# Patient Record
Sex: Male | Born: 1955
Health system: Southern US, Community
[De-identification: ages and names within clinical notes are randomized; demographics above are authoritative.]

## PROBLEM LIST (undated history)

## (undated) DIAGNOSIS — I428 Other cardiomyopathies: Secondary | ICD-10-CM

## (undated) DIAGNOSIS — C679 Malignant neoplasm of bladder, unspecified: Secondary | ICD-10-CM

## (undated) DIAGNOSIS — I4510 Unspecified right bundle-branch block: Secondary | ICD-10-CM

## (undated) DIAGNOSIS — Z950 Presence of cardiac pacemaker: Secondary | ICD-10-CM

## (undated) DIAGNOSIS — Q2381 Bicuspid aortic valve: Secondary | ICD-10-CM

## (undated) DIAGNOSIS — Z87898 Personal history of other specified conditions: Secondary | ICD-10-CM

## (undated) DIAGNOSIS — M1990 Unspecified osteoarthritis, unspecified site: Secondary | ICD-10-CM

## (undated) DIAGNOSIS — I2510 Atherosclerotic heart disease of native coronary artery without angina pectoris: Secondary | ICD-10-CM

## (undated) DIAGNOSIS — I10 Essential (primary) hypertension: Secondary | ICD-10-CM

## (undated) DIAGNOSIS — I77810 Thoracic aortic ectasia: Secondary | ICD-10-CM

## (undated) DIAGNOSIS — K7689 Other specified diseases of liver: Secondary | ICD-10-CM

## (undated) DIAGNOSIS — R350 Frequency of micturition: Secondary | ICD-10-CM

## (undated) DIAGNOSIS — Z8679 Personal history of other diseases of the circulatory system: Secondary | ICD-10-CM

## (undated) DIAGNOSIS — R945 Abnormal results of liver function studies: Secondary | ICD-10-CM

## (undated) DIAGNOSIS — I351 Nonrheumatic aortic (valve) insufficiency: Secondary | ICD-10-CM

## (undated) DIAGNOSIS — R51 Headache: Secondary | ICD-10-CM

## (undated) DIAGNOSIS — Z953 Presence of xenogenic heart valve: Secondary | ICD-10-CM

## (undated) DIAGNOSIS — R351 Nocturia: Secondary | ICD-10-CM

## (undated) DIAGNOSIS — Q231 Congenital insufficiency of aortic valve: Secondary | ICD-10-CM

## (undated) DIAGNOSIS — R519 Headache, unspecified: Secondary | ICD-10-CM

## (undated) DIAGNOSIS — R011 Cardiac murmur, unspecified: Secondary | ICD-10-CM

## (undated) DIAGNOSIS — R7989 Other specified abnormal findings of blood chemistry: Secondary | ICD-10-CM

## (undated) DIAGNOSIS — K219 Gastro-esophageal reflux disease without esophagitis: Secondary | ICD-10-CM

## (undated) HISTORY — DX: Other specified abnormal findings of blood chemistry: R79.89

## (undated) HISTORY — DX: Thoracic aortic ectasia: I77.810

## (undated) HISTORY — DX: Abnormal results of liver function studies: R94.5

## (undated) HISTORY — DX: Other specified diseases of liver: K76.89

## (undated) HISTORY — DX: Nonrheumatic aortic (valve) insufficiency: I35.1

## (undated) HISTORY — DX: Bicuspid aortic valve: Q23.81

## (undated) HISTORY — DX: Essential (primary) hypertension: I10

## (undated) HISTORY — PX: APPENDECTOMY: SHX54

## (undated) HISTORY — PX: TRANSTHORACIC ECHOCARDIOGRAM: SHX275

## (undated) HISTORY — DX: Personal history of other specified conditions: Z87.898

## (undated) HISTORY — DX: Congenital insufficiency of aortic valve: Q23.1

## (undated) HISTORY — DX: Other cardiomyopathies: I42.8

## (undated) HISTORY — PX: CARDIAC CATHETERIZATION: SHX172

## (undated) HISTORY — PX: OTHER SURGICAL HISTORY: SHX169

---

## 1999-11-02 ENCOUNTER — Emergency Department (HOSPITAL_COMMUNITY): Admission: EM | Admit: 1999-11-02 | Discharge: 1999-11-02 | Payer: Self-pay

## 2007-08-06 HISTORY — PX: HYDROCELE EXCISION / REPAIR: SUR1145

## 2008-03-15 ENCOUNTER — Telehealth (INDEPENDENT_AMBULATORY_CARE_PROVIDER_SITE_OTHER): Payer: Self-pay | Admitting: *Deleted

## 2008-03-15 ENCOUNTER — Ambulatory Visit: Payer: Self-pay | Admitting: Gastroenterology

## 2008-03-30 ENCOUNTER — Ambulatory Visit: Payer: Self-pay | Admitting: Gastroenterology

## 2008-03-31 ENCOUNTER — Ambulatory Visit: Payer: Self-pay | Admitting: Cardiovascular Disease

## 2008-03-31 ENCOUNTER — Ambulatory Visit: Payer: Self-pay

## 2008-04-12 ENCOUNTER — Encounter: Payer: Self-pay | Admitting: Cardiovascular Disease

## 2008-04-12 ENCOUNTER — Ambulatory Visit: Payer: Self-pay

## 2008-04-19 ENCOUNTER — Ambulatory Visit: Payer: Self-pay | Admitting: Cardiovascular Disease

## 2008-07-11 ENCOUNTER — Encounter: Payer: Self-pay | Admitting: Cardiovascular Disease

## 2008-07-11 ENCOUNTER — Ambulatory Visit: Payer: Self-pay | Admitting: Cardiovascular Disease

## 2008-07-11 ENCOUNTER — Ambulatory Visit: Payer: Self-pay

## 2008-08-09 ENCOUNTER — Ambulatory Visit: Payer: Self-pay | Admitting: Cardiovascular Disease

## 2008-08-09 LAB — CONVERTED CEMR LAB
Glucose, Bld: 86 mg/dL (ref 70–99)
HCT: 41.6 % (ref 39.0–52.0)
Hemoglobin: 14.8 g/dL (ref 13.0–17.0)
INR: 1 (ref 0.8–1.0)
Lymphocytes Relative: 24.6 % (ref 12.0–46.0)
Monocytes Relative: 7.4 % (ref 3.0–12.0)
Prothrombin Time: 10.7 s — ABNORMAL LOW (ref 10.9–13.3)
RDW: 12.2 % (ref 11.5–14.6)
WBC: 12.1 10*3/uL — ABNORMAL HIGH (ref 4.5–10.5)

## 2008-08-10 ENCOUNTER — Inpatient Hospital Stay (HOSPITAL_BASED_OUTPATIENT_CLINIC_OR_DEPARTMENT_OTHER): Admission: RE | Admit: 2008-08-10 | Discharge: 2008-08-10 | Payer: Self-pay | Admitting: Cardiovascular Disease

## 2008-08-10 ENCOUNTER — Ambulatory Visit: Payer: Self-pay | Admitting: Cardiovascular Disease

## 2008-08-10 HISTORY — PX: CARDIAC CATHETERIZATION: SHX172

## 2008-08-30 ENCOUNTER — Ambulatory Visit: Payer: Self-pay | Admitting: Cardiovascular Disease

## 2008-08-30 ENCOUNTER — Ambulatory Visit (HOSPITAL_COMMUNITY): Admission: RE | Admit: 2008-08-30 | Discharge: 2008-08-30 | Payer: Self-pay | Admitting: Cardiovascular Disease

## 2008-11-09 ENCOUNTER — Emergency Department (HOSPITAL_COMMUNITY): Admission: EM | Admit: 2008-11-09 | Discharge: 2008-11-09 | Payer: Self-pay | Admitting: Emergency Medicine

## 2009-01-30 ENCOUNTER — Telehealth: Payer: Self-pay | Admitting: Cardiovascular Disease

## 2009-01-30 DIAGNOSIS — I359 Nonrheumatic aortic valve disorder, unspecified: Secondary | ICD-10-CM | POA: Insufficient documentation

## 2009-02-20 ENCOUNTER — Encounter: Payer: Self-pay | Admitting: Cardiovascular Disease

## 2009-02-20 ENCOUNTER — Ambulatory Visit: Payer: Self-pay | Admitting: Thoracic Surgery (Cardiothoracic Vascular Surgery)

## 2009-03-02 ENCOUNTER — Telehealth: Payer: Self-pay | Admitting: Cardiovascular Disease

## 2009-05-05 ENCOUNTER — Encounter: Payer: Self-pay | Admitting: Cardiovascular Disease

## 2009-05-08 ENCOUNTER — Encounter: Payer: Self-pay | Admitting: Thoracic Surgery (Cardiothoracic Vascular Surgery)

## 2009-05-08 ENCOUNTER — Ambulatory Visit: Payer: Self-pay | Admitting: Vascular Surgery

## 2009-05-08 ENCOUNTER — Ambulatory Visit: Payer: Self-pay | Admitting: Thoracic Surgery (Cardiothoracic Vascular Surgery)

## 2009-05-08 ENCOUNTER — Telehealth (INDEPENDENT_AMBULATORY_CARE_PROVIDER_SITE_OTHER): Payer: Self-pay | Admitting: *Deleted

## 2009-05-08 ENCOUNTER — Ambulatory Visit (HOSPITAL_COMMUNITY)
Admission: RE | Admit: 2009-05-08 | Discharge: 2009-05-08 | Payer: Self-pay | Admitting: Thoracic Surgery (Cardiothoracic Vascular Surgery)

## 2009-05-08 ENCOUNTER — Encounter: Payer: Self-pay | Admitting: Cardiovascular Disease

## 2009-05-10 ENCOUNTER — Encounter: Payer: Self-pay | Admitting: Thoracic Surgery (Cardiothoracic Vascular Surgery)

## 2009-05-10 ENCOUNTER — Ambulatory Visit: Payer: Self-pay | Admitting: Thoracic Surgery (Cardiothoracic Vascular Surgery)

## 2009-05-10 ENCOUNTER — Inpatient Hospital Stay (HOSPITAL_COMMUNITY)
Admission: RE | Admit: 2009-05-10 | Discharge: 2009-05-14 | Payer: Self-pay | Admitting: Thoracic Surgery (Cardiothoracic Vascular Surgery)

## 2009-05-10 ENCOUNTER — Ambulatory Visit: Payer: Self-pay | Admitting: Cardiovascular Disease

## 2009-05-10 DIAGNOSIS — Z953 Presence of xenogenic heart valve: Secondary | ICD-10-CM

## 2009-05-10 HISTORY — DX: Presence of xenogenic heart valve: Z95.3

## 2009-05-15 ENCOUNTER — Telehealth: Payer: Self-pay | Admitting: Cardiovascular Disease

## 2009-05-19 ENCOUNTER — Ambulatory Visit: Payer: Self-pay | Admitting: Thoracic Surgery (Cardiothoracic Vascular Surgery)

## 2009-05-22 ENCOUNTER — Encounter: Payer: Self-pay | Admitting: Cardiovascular Disease

## 2009-05-25 ENCOUNTER — Encounter (INDEPENDENT_AMBULATORY_CARE_PROVIDER_SITE_OTHER): Payer: Self-pay | Admitting: *Deleted

## 2009-05-30 ENCOUNTER — Ambulatory Visit: Payer: Self-pay | Admitting: Cardiovascular Disease

## 2009-06-05 ENCOUNTER — Encounter
Admission: RE | Admit: 2009-06-05 | Discharge: 2009-06-05 | Payer: Self-pay | Admitting: Thoracic Surgery (Cardiothoracic Vascular Surgery)

## 2009-06-05 ENCOUNTER — Ambulatory Visit: Payer: Self-pay | Admitting: Thoracic Surgery (Cardiothoracic Vascular Surgery)

## 2009-06-08 ENCOUNTER — Encounter: Payer: Self-pay | Admitting: Cardiovascular Disease

## 2009-07-10 ENCOUNTER — Telehealth: Payer: Self-pay | Admitting: Cardiovascular Disease

## 2009-10-02 ENCOUNTER — Ambulatory Visit: Payer: Self-pay | Admitting: Thoracic Surgery (Cardiothoracic Vascular Surgery)

## 2009-10-02 ENCOUNTER — Encounter: Payer: Self-pay | Admitting: Cardiovascular Disease

## 2010-06-20 ENCOUNTER — Ambulatory Visit: Payer: Self-pay | Admitting: Cardiovascular Disease

## 2010-07-03 ENCOUNTER — Encounter: Payer: Self-pay | Admitting: Cardiovascular Disease

## 2010-07-03 ENCOUNTER — Ambulatory Visit: Payer: Self-pay

## 2010-07-03 ENCOUNTER — Ambulatory Visit: Payer: Self-pay | Admitting: Cardiology

## 2010-07-03 ENCOUNTER — Ambulatory Visit (HOSPITAL_COMMUNITY): Admission: RE | Admit: 2010-07-03 | Discharge: 2010-07-03 | Payer: Self-pay | Admitting: Cardiovascular Disease

## 2010-09-04 NOTE — Assessment & Plan Note (Signed)
Summary: YEARLY/SL   Visit Type:  1 YR F/U Primary Provider:  Almedia Balls  CC:  calf pain at times when walking.Marland Kitchengets better w/rest....denies any other complai8nts today.  History of Present Illness: 55 yo WM with history of bicuspid aortic valve now s/p aortic valve repair with aortic root replacement by Dr. Cornelius Moras 05/10/09 here today for follow up. I last saw him October 2010.  He has been doing well. He has no shortness of breath. He has been feeling well. He has been riding his bike 40 miles per day at least 4 days per week.   Current Medications (verified): 1)  Aspirin 81 Mg Tbec (Aspirin) .... Take One Tablet By Mouth Daily 2)  Multivitamins   Tabs (Multiple Vitamin) .[name].. 1 Tab Once Daily 3)  Fish Oil 1000 Mg Caps (Omega-3 Fatty Acids) .[name].. 1 Cap Once Daily 4)  Vitamin D 1000 Unit Tabs (Cholecalciferol) .[name].. 1 Tab Once Daily  Allergies (verified): No Known Drug Allergies  Past History:  Past Medical History: Reviewed history from 05/30/2009 and no changes required. Bicuspid Aortic valve with moderate AI s/p AVR with bioprosthetic AV 10/10 Thoracic aortic aneurysm s/p aortic root repair 10/10 Tobacco Abuse-stopped 2010  Social History: Reviewed history from 05/30/2009 and no changes required. Tobacco abuse-1 1/2 ppd for 35 years-stopped 2010 No alcohol No illicit drugs Single 2 children  Review of Systems  The patient denies fatigue, malaise, fever, weight gain/loss, vision loss, decreased hearing, hoarseness, chest pain, palpitations, shortness of breath, prolonged cough, wheezing, sleep apnea, coughing up blood, abdominal pain, blood in stool, nausea, vomiting, diarrhea, heartburn, incontinence, blood in urine, muscle weakness, joint pain, leg swelling, rash, skin lesions, headache, fainting, dizziness, depression, anxiety, enlarged lymph nodes, easy bruising or bleeding, and environmental allergies.    Vital Signs:  Patient profile:   55 year old male Height:      72  inches Weight:      184.8 pounds BMI:     25.15 Pulse rate:   70 / minute Pulse rhythm:   irregular BP sitting:   128 / 88  (left arm) Cuff size:   large  Vitals Entered By: Danielle Rankin, CMA (June 20, 2010 9:17 AM)  Physical Exam  General:  General: Well developed, well nourished, NAD HEENT: OP clear, mucus membranes moist SKIN: warm, dry Neuro: No focal deficits Musculoskeletal: Muscle strength 5/5 all ext Psychiatric: Mood and affect normal Neck: No JVD, no carotid bruits, no thyromegaly, no lymphadenopathy. Lungs:Clear bilaterally, no wheezes, rhonci, crackles CV: RRR no murmurs, gallops rubs Abdomen: soft, NT, ND, BS present Extremities: No edema, pulses 2+.    EKG  Procedure date:  06/20/2010  Findings:      NSR, rate 70 bpm. RBBB  Impression & Recommendations:  Problem # 1:  AORTIC VALVE DISORDERS (ICD-424.1)  s/p AVR with bioprosthetic AV with aortic root replacement 2010. Doing well. Continue daily ASA and daily exercise. Will repeat echo. I will see him back in one year.   Orders: EKG w/ Interpretation (93000) Echocardiogram (Echo)  Patient Instructions: 1)  Your physician recommends that you schedule a follow-up appointment in: 1 year 2)  Your physician recommends that you continue on your current medications as directed. Please refer to the Current Medication list given to you today. 3)  Your physician has requested that you have an echocardiogram.  Echocardiography is a painless test that uses sound waves to create images of your heart. It provides your doctor with information about the size and shape  of your heart and how well your heart's chambers and valves are working.  This procedure takes approximately one hour. There are no restrictions for this procedure.

## 2010-09-04 NOTE — Letter (Signed)
Summary: [facility]   [facility]   Imported By: Roderic Ovens 10/12/2009 15:04:40  _____________________________________________________________________  External Attachment:    Type:   Image     Comment:   External Document

## 2010-11-08 LAB — POCT I-STAT 3, ART BLOOD GAS (G3+)
Acid-base deficit: 2 mmol/L (ref 0.0–2.0)
Bicarbonate: 23.8 mEq/L (ref 20.0–24.0)
O2 Saturation: 99 %
O2 Saturation: 99 %
pCO2 arterial: 41.4 mmHg (ref 35.0–45.0)
pCO2 arterial: 44.5 mmHg (ref 35.0–45.0)
pH, Arterial: 7.364 (ref 7.350–7.450)
pH, Arterial: 7.366 (ref 7.350–7.450)
pO2, Arterial: 127 mmHg — ABNORMAL HIGH (ref 80.0–100.0)
pO2, Arterial: 137 mmHg — ABNORMAL HIGH (ref 80.0–100.0)

## 2010-11-08 LAB — CBC
HCT: 42.4 % (ref 39.0–52.0)
Hemoglobin: 11.7 g/dL — ABNORMAL LOW (ref 13.0–17.0)
MCHC: 33.8 g/dL (ref 30.0–36.0)
MCHC: 34.3 g/dL (ref 30.0–36.0)
MCV: 97 fL (ref 78.0–100.0)
MCV: 97.3 fL (ref 78.0–100.0)
MCV: 97.5 fL (ref 78.0–100.0)
Platelets: 120 10*3/uL — ABNORMAL LOW (ref 150–400)
Platelets: 147 10*3/uL — ABNORMAL LOW (ref 150–400)
Platelets: 150 10*3/uL (ref 150–400)
Platelets: 153 10*3/uL (ref 150–400)
RBC: 3.34 MIL/uL — ABNORMAL LOW (ref 4.22–5.81)
RBC: 3.51 MIL/uL — ABNORMAL LOW (ref 4.22–5.81)
RBC: 3.6 MIL/uL — ABNORMAL LOW (ref 4.22–5.81)
RBC: 4.36 MIL/uL (ref 4.22–5.81)
RDW: 13.6 % (ref 11.5–15.5)
RDW: 13.7 % (ref 11.5–15.5)
RDW: 13.9 % (ref 11.5–15.5)
WBC: 11.5 10*3/uL — ABNORMAL HIGH (ref 4.0–10.5)
WBC: 16.1 10*3/uL — ABNORMAL HIGH (ref 4.0–10.5)
WBC: 17.8 10*3/uL — ABNORMAL HIGH (ref 4.0–10.5)
WBC: 23.7 10*3/uL — ABNORMAL HIGH (ref 4.0–10.5)

## 2010-11-08 LAB — BLOOD GAS, ARTERIAL
Acid-base deficit: 1 mmol/L (ref 0.0–2.0)
Bicarbonate: 22.7 mEq/L (ref 20.0–24.0)
Drawn by: 206361
FIO2: 0.21 %
TCO2: 23.8 mmol/L (ref 0–100)
pH, Arterial: 7.425 (ref 7.350–7.450)
pO2, Arterial: 88.2 mmHg (ref 80.0–100.0)

## 2010-11-08 LAB — POCT I-STAT 4, (NA,K, GLUC, HGB,HCT)
Glucose, Bld: 107 mg/dL — ABNORMAL HIGH (ref 70–99)
Glucose, Bld: 110 mg/dL — ABNORMAL HIGH (ref 70–99)
Glucose, Bld: 141 mg/dL — ABNORMAL HIGH (ref 70–99)
Glucose, Bld: 152 mg/dL — ABNORMAL HIGH (ref 70–99)
Glucose, Bld: 45 mg/dL — ABNORMAL LOW (ref 70–99)
HCT: 27 % — ABNORMAL LOW (ref 39.0–52.0)
HCT: 28 % — ABNORMAL LOW (ref 39.0–52.0)
HCT: 34 % — ABNORMAL LOW (ref 39.0–52.0)
HCT: 37 % — ABNORMAL LOW (ref 39.0–52.0)
Hemoglobin: 11.6 g/dL — ABNORMAL LOW (ref 13.0–17.0)
Hemoglobin: 12.6 g/dL — ABNORMAL LOW (ref 13.0–17.0)
Hemoglobin: 9.2 g/dL — ABNORMAL LOW (ref 13.0–17.0)
Hemoglobin: 9.5 g/dL — ABNORMAL LOW (ref 13.0–17.0)
Potassium: 3.8 mEq/L (ref 3.5–5.1)
Potassium: 4 mEq/L (ref 3.5–5.1)
Sodium: 141 mEq/L (ref 135–145)
Sodium: 143 mEq/L (ref 135–145)

## 2010-11-08 LAB — TYPE AND SCREEN: ABO/RH(D): A POS

## 2010-11-08 LAB — COMPREHENSIVE METABOLIC PANEL
AST: 34 U/L (ref 0–37)
Albumin: 4.2 g/dL (ref 3.5–5.2)
Alkaline Phosphatase: 48 U/L (ref 39–117)
CO2: 24 mEq/L (ref 19–32)
Calcium: 10.3 mg/dL (ref 8.4–10.5)
GFR calc Af Amer: >60 mL/min (ref >60)
Sodium: 140 mEq/L (ref 135–145)
Total Bilirubin: 0.7 mg/dL (ref 0.3–1.2)

## 2010-11-08 LAB — POCT I-STAT, CHEM 8
BUN: 10 mg/dL (ref 6–23)
Calcium, Ion: 1.16 mmol/L (ref 1.12–1.32)
HCT: 31 % — ABNORMAL LOW (ref 39.0–52.0)
Hemoglobin: 10.5 g/dL — ABNORMAL LOW (ref 13.0–17.0)
Sodium: 143 mEq/L (ref 135–145)
TCO2: 23 mmol/L (ref 0–100)

## 2010-11-08 LAB — BASIC METABOLIC PANEL
BUN: 14 mg/dL (ref 6–23)
CO2: 25 mEq/L (ref 19–32)
Calcium: 8.4 mg/dL (ref 8.4–10.5)
Calcium: 8.9 mg/dL (ref 8.4–10.5)
Chloride: 111 mEq/L (ref 96–112)
Creatinine, Ser: 0.76 mg/dL (ref 0.4–1.5)
GFR calc Af Amer: >60 mL/min (ref >60)
GFR calc non Af Amer: >60 mL/min (ref >60)
Glucose, Bld: 119 mg/dL — ABNORMAL HIGH (ref 70–99)
Glucose, Bld: 127 mg/dL — ABNORMAL HIGH (ref 70–99)

## 2010-11-08 LAB — GLUCOSE, CAPILLARY
Glucose-Capillary: 117 mg/dL — ABNORMAL HIGH (ref 70–99)
Glucose-Capillary: 124 mg/dL — ABNORMAL HIGH (ref 70–99)
Glucose-Capillary: 124 mg/dL — ABNORMAL HIGH (ref 70–99)
Glucose-Capillary: 128 mg/dL — ABNORMAL HIGH (ref 70–99)
Glucose-Capillary: 133 mg/dL — ABNORMAL HIGH (ref 70–99)
Glucose-Capillary: 175 mg/dL — ABNORMAL HIGH (ref 70–99)
Glucose-Capillary: 28 mg/dL — CL (ref 70–99)
Glucose-Capillary: 71 mg/dL (ref 70–99)
Glucose-Capillary: 96 mg/dL (ref 70–99)

## 2010-11-08 LAB — MAGNESIUM
Magnesium: 2.3 mg/dL (ref 1.5–2.5)
Magnesium: 2.6 mg/dL — ABNORMAL HIGH (ref 1.5–2.5)

## 2010-11-08 LAB — CREATININE, SERUM
Creatinine, Ser: 0.79 mg/dL (ref 0.4–1.5)
GFR calc Af Amer: >60 mL/min (ref >60)
GFR calc non Af Amer: >60 mL/min (ref >60)

## 2010-11-08 LAB — URINALYSIS, ROUTINE W REFLEX MICROSCOPIC
Bilirubin Urine: NEGATIVE
Hgb urine dipstick: NEGATIVE
Ketones, ur: NEGATIVE mg/dL
Specific Gravity, Urine: 1.011 (ref 1.005–1.030)
pH: 5.5 (ref 5.0–8.0)

## 2010-11-08 LAB — PROTIME-INR
INR: 1.51 — ABNORMAL HIGH (ref 0.00–1.49)
Prothrombin Time: 18.1 seconds — ABNORMAL HIGH (ref 11.6–15.2)

## 2010-11-08 LAB — POCT I-STAT GLUCOSE
Glucose, Bld: 126 mg/dL — ABNORMAL HIGH (ref 70–99)
Glucose, Bld: 148 mg/dL — ABNORMAL HIGH (ref 70–99)
Operator id: 3406
Operator id: 3406

## 2010-11-08 LAB — APTT
aPTT: 25 seconds (ref 24–37)
aPTT: 39 seconds — ABNORMAL HIGH (ref 24–37)

## 2010-11-08 LAB — ABO/RH: ABO/RH(D): A POS

## 2010-12-18 NOTE — H&P (Signed)
HISTORY AND PHYSICAL EXAMINATION   February 20, 2009   Re:  [name], [name]           DOB:  12-13-1955   REASON FOR CONSULTATION:  Severe aortic insufficiency.   HISTORY OF PRESENT ILLNESS:  The patient is a 55 year old gentleman with  no previous history of coronary artery disease, congestive heart  failure, or valvular heart disease, who had otherwise been physically  active and healthy most of his life.  Approximately 1 year ago, he was  noted to have a heart murmur on routine physical exam and wellness  check.  An echocardiogram was performed at that time demonstrating  aortic regurgitation.  Ultimately he was referred to Dr. Verne Carrow.  He underwent a 2-D echocardiogram in December 2009, what was  interpreted as moderate aortic regurgitation with bicuspid aortic valve.  There was dilatation of the left ventricular outflow tract and aortic  root, and there was mild-to-moderate left ventricular dysfunction with  ejection fraction estimated at 45%.  There was mild left ventricular  hypertrophy.  The patient subsequently underwent cardiac MRI and left  and right heart catheterization on August 10, 2008.  This confirmed the  presence of at least moderate-to-severe aortic regurgitation with  significant left ventricular dysfunction with ejection fraction  estimated at 45%.  The aortic root and ascending thoracic aorta was  dilated to 4.5 cm and the presence of bicuspid aortic valve was  confirmed.  There was normal coronary artery anatomy with no significant  coronary artery disease.  No other abnormalities were noted.  Dr.  Clifton James discussed the possibility of referral for elective surgical  intervention, and the patient elected to hold off until now for a  variety of reasons.  He now desires to consider elective surgical  intervention.   REVIEW OF SYSTEMS:  General:  The patient reports feeling well.  He has  normal appetite.  He has not been  gaining nor losing weight.  He is 6  feet tall and weighs approximately 190 pounds.  Cardiac:  The patient  denies any shortness of breath with activity or at rest.  He states that  he gets short of breath if he pushes himself physically, but this is  stable and has not changed or diminished over time.  He reports  occasional mild transient vague substernal chest discomfort that is  described as a dull ache, but this seems to come and go sporadically and  is not typically severe and is not associated with physical activity.  He denies any tachypalpitations or dizzy spells.  He denies any PND,  orthopnea, or lower extremity edema.  Respiratory:  Negative.  The  patient denies productive cough, hemoptysis, or wheezing.  Gastrointestinal:  Negative.  The patient has no difficulty swallowing.  He denies hematochezia, hematemesis, or melena.  Genitourinary:  Negative.  The patient denies urinary urgency or frequency.  Musculoskeletal:  Notable for chronic problems with mild lower back pain  and secondary sciatic pain in both lower extremities.  This is stable  and chronic.  Neurologic:  Negative.  The patient denies transient  monocular blindness or transient numbness or weakness involving either  upper and lower extremity.  Psychiatric:  Negative.  HEENT:  Negative.  The patient sees his dentist regularly and is routinely given oral  antibiotics for prophylaxis due to the presence of his underlying heart  murmur.   PAST MEDICAL HISTORY:  1. Bicuspid aortic valve with aortic regurgitation.  2. Dilated aortic root.  PAST SURGICAL HISTORY:  Hydrocele repair.   FAMILY HISTORY:  The patient's father had mitral valve repair for mitral  valve prolapse.   SOCIAL HISTORY:  The patient is single and lives with his significant  other locally here in [place].  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  He has a longstanding history of tobacco  abuse and currently  reports smoking one and a half pack of cigarettes  per day.  He denies alcohol consumption of significance.   CURRENT MEDICATIONS:  Ibuprofen as needed for pain.   DRUG ALLERGIES:  None known.   PHYSICAL EXAMINATION:  GENERAL:  The patient is a well-appearing male,  who appears his stated age in no acute distress.  VITAL SIGNS:  Blood pressure 150/92, pulse 90, and oxygen saturation 97%  on room air.  HEENT:  Grossly unrevealing.  NECK:  Supple.  There is no cervical or supraclavicular lymphadenopathy.  There is no jugular venous distention.  No carotid bruits are noted.  LUNGS:  Auscultation of the chest reveals clear breath sounds which are  symmetrical bilaterally.  No wheezes or rhonchi are noted.  CARDIOVASCULAR:  Regular rate and rhythm.  There is a grade 2/6 early  diastolic murmur heard best along the left lower sternal border.  No  systolic murmurs are appreciated.  ABDOMEN:  Soft, nondistended, and nontender.  Bowel sounds are present.  EXTREMITIES:  Warm and well perfused.  There is no lower extremity  edema.  Distal pulses are easily palpable of water hammer  characteristics.  RECTAL/GU:  Deferred.  NEUROLOGIC:  Grossly nonfocal.   DIAGNOSTIC TESTS:  A 2-D echocardiogram performed on July 11, 2008,  is reviewed.  This demonstrates what appears to be functionally bicuspid  aortic valve with at least moderate aortic regurgitation.  The aortic  root is dilated.  No other significant abnormalities are noted.  There  is mild left ventricular hypertrophy and there is mild left ventricular  chamber enlargement.   Cardiac MRI performed on August 10, 2008, is reviewed.  Findings are as  discussed previously and notable for the presence of aortic  regurgitation as well as dilated aortic root.  The maximum transverse  diameter of the proximal ascending aorta is 4.5 cm.  This tapers down to  a normal appearance at the transverse aortic arch and the descending  thoracic aorta  is normal.   Cardiac catheterization performed on August 10, 2008, is reviewed.  This  demonstrates normal coronary artery anatomy with no significant coronary  artery disease.  There is at least moderate aortic regurgitation on  aortic root injection.  There is some left ventricular chamber  enlargement.  There is moderate left ventricular dysfunction with  ejection fraction estimated at 40-45%.   IMPRESSION:  Aortic regurgitation with bicuspid aortic valve.  The  patient has mild-to-moderate left ventricular dysfunction with ejection  fraction estimated at 45%.  Alternatives at this time include continued  close observation with medical therapy versus proceeding with surgery.  The rationale for proceeding with surgery are discussed at length.  All  surgical alternatives have been reviewed in detail.   PLAN:  I have discussed matters at length with the patient and his close  friend here in the office today.  We discussed possible surgical  alternatives including replacement of his entire aortic root using a  valve conduit with mechanical prosthesis.  This would provide a durable  long-term result, but come with the need for  long-term anticoagulation  with Coumadin.  We discussed a variety of alternatives including  replacement of his aortic root with a synthetic conduit and use of a  bioprosthetic tissue valve or perhaps even valve-sparing aortic root if  his native aortic valve can be repaired.  We discussed a Ross procedure  and homografts as other alternatives as well.  All of their questions  have been addressed.  The patient desires to proceed with surgery, but  would like to hold off until May 05, 2009.  He is inclined to want to  avoid Coumadin if at all possible.  As such, we would plan to replace  his aortic root using a synthetic prosthesis and take a look at his  native aortic valve.  If it can be repaired, we would proceed with valve-  sparing aortic replacement.  If  not, we would use a bioprosthetic tissue  valve at the time of valve replacement.  He understands that this would  come with significant risk for late structural valve deterioration and  failure potentially requiring additional surgical intervention at some  point in the distant future.  All of his questions have been addressed.  We will plan to see the patient back for  followup on Monday, May 08, 2009, with tentative plans to proceed  with surgery on Wednesday, May 10, 2009.   Salvatore Decent. Cornelius Moras, M.D.  Electronically Signed   CHO/MEDQ  D:  02/20/2009  T:  02/21/2009  Job:  [id number]   cc:   Verne Carrow, MD  Almedia Balls

## 2010-12-18 NOTE — H&P (Signed)
HISTORY AND PHYSICAL EXAMINATION   May 08, 2009   Re:  [name], [name]           DOB:  Nov 27, 1955   HISTORY OF PRESENT ILLNESS:  The patient is a 55 year old gentleman who  returns to the office today for followup of severe aortic regurgitation  with tentative plans to proceed with surgery on Wednesday this week for  aortic valve replacement and aortic root replacement in the setting of  bicuspid aortic valve with aneurysmal enlargement of the aortic root and  proximal ascending thoracic aorta.  He was originally seen in  consultation on February 20, 2009, and a full consultation report and  history and physical exam was dictated at that time.  Since then, the  patient has been successful at stopping smoking.  He has also started  exercising and he has done quite well overall.  He is eager to proceed  with surgery as described.  He specifically denies any problems with  shortness of breath, productive cough, chest pain, dizzy spells, tachy  palpitations.  He has lost approximately 20 pounds since he started  exercising and taking better care of himself.  The remainder of his  review of systems is unremarkable.  The remainder of his past medical  history is unchanged.   CURRENT MEDICATIONS:  None.   DRUG ALLERGIES:  None known.   PHYSICAL EXAMINATION:  The patient is a well-appearing male with blood  pressure 148/91, pulse 92, oxygen saturation 97%.  HEENT exam is  unrevealing.  Neck is supple.  There is no palpable lymphadenopathy.  Auscultation of the chest demonstrates clear breath sounds which are  symmetrical.  Cardiovascular exam reveals regular rate and rhythm.  There is a grade 3/6 diastolic murmur heard best along the left lower  sternal border.  No systolic murmurs are appreciated.  The abdomen is  soft, nontender.  The extremities are warm and well perfused.  There is  no lower extremity edema.  Pulses are easily palpable.  Rectal and GU  exams  are both deferred.   IMPRESSION:  Bicuspid aortic valve with severe aortic regurgitation,  mild-to-moderate left ventricular dysfunction and aneurysm involving the  aortic root and proximal ascending thoracic aorta.  We plan to proceed  with aortic root replacement on Wednesday this week.  I again revisited  surgical alternatives at length with the patient here in the office  today.  We discussed continued medical therapy versus proceeding with  surgery and the rationale of this decision at this time.  The patient  does not wish to have a mechanical valve and need to be on long-term  anticoagulation with Coumadin.  He understands that it is relatively  unlikely that his native aortic valve can be repairable, although we  will certainly take a look at it at the time of surgery.  Assuming that  is not repairable, we would plan to replace his valve using a  bioprosthetic tissue valve conduit.  He understands that this will come  with significant risk for late structural valve deterioration and  failure at some point down the road.  He understands and accepts all  potential associated risks of surgery including but not limited to risk  of death, stroke, myocardial infarction, congestive heart failure,  respiratory failure, renal failure, bleeding requiring blood  transfusion, arrhythmia, heart block with bradycardia requiring  permanent pacemaker, late complications related to valve repair or  replacement, the significant possibility of late structural valve  deterioration and failure  if his valve is replaced using a bioprosthetic  tissue valve.  All of his questions have been addressed.   Salvatore Decent. Cornelius Moras, M.D.  Electronically Signed   CHO/MEDQ  D:  05/08/2009  T:  05/09/2009  Job:  [id number]   cc:   Verne Carrow, MD  Almedia Balls, MD

## 2010-12-18 NOTE — Cardiovascular Report (Signed)
NAME:  [name], [name] NO.:  [id number]   MEDICAL RECORD NO.:  [id number]          PATIENT TYPE:  OIB   LOCATION:  1961                         FACILITY:  [facility]   PHYSICIAN:  Verne Carrow, MDDATE OF BIRTH:  1955-08-11   DATE OF PROCEDURE:  08/10/2008  DATE OF DISCHARGE:                            CARDIAC CATHETERIZATION   PRIMARY CARDIOLOGIST:  Verne Carrow, MD   PROCEDURES PERFORMED:  1. Left heart catheterization.  2. Selective coronary angiography.  3. Left ventricular angiogram.  4. Aortic root angiogram.   OPERATOR:  Verne Carrow, MD   INDICATIONS:  This is a 55 year old Caucasian male with a history of  moderate aortic insufficiency who is known to have mild LV dysfunction  and has been complaining of substernal chest pain that is atypical in  nature.   PROCEDURE IN DETAIL:  The patient was brought into the Outpatient  Cardiac Catheterization Laboratory after signing informed consent.  The  right groin was prepped and draped in the sterile fashion.  Lidocaine 1%  was used for local anesthesia.  A 4-French sheath was inserted into the  right femoral artery without difficulty.  A 4-French JL-5 diagnostic  catheter was used to selectively engage and inject the left coronary  system.  A 4-French 3DRC diagnostic catheter was used to selectively  engage and inject the right coronary artery.  A 4-French pigtail  catheter was used to cross the aortic valve into the left ventricle.  Following performance of the left ventricular angiogram, the pigtail  catheter was pulled back across the aortic valve with no significant  pressure gradient measured.  The pigtail catheter was also used to  perform an aortic root angiogram.  The patient tolerated the procedure  well and was taken to the holding area in stable condition.   ANGIOGRAPHIC FINDINGS:  1. The left main coronary artery bifurcates into the circumflex and      the LAD and has no  evidence of disease.  2. The left anterior descending is a large vessel that courses to the      apex and gives off 2 moderate-sized diagonal branches.  There are      also 3 small septal perforating branches.  There is no significant      disease noted in the LAD system.  There is one area in the      midportion of the vessel that may represent mild myocardial      bridging.  3. Circumflex artery gives off several large obtuse marginal branches.      There is no evidence of disease in this system.  4. The right coronary artery is a large dominant vessel that has no      angiographic evidence of disease.  This bifurcates into a PDA and a      posterolateral branch.  5. Left ventricular angiogram demonstrates mild global LV dysfunction      with an ejection fraction estimated at 40-45%.   Aortic root angiogram demonstrates mildly dilated aortic root with  moderate aortic insufficiency.   HEMODYNAMIC DATA:  LV pressure 134/12, end-diastolic pressure 18, and  central aortic pressure  113/68.   IMPRESSION:  1. No angiographic evidence of coronary artery disease.  2. Mild global left ventricular systolic dysfunction.  3. Moderate aortic insufficiency.  4. Mild dilation of the aortic root.   RECOMMENDATIONS:  I will have Mr. [name] follow up in my office in 3-  4 months.  We will perform a cardiac MRI prior to his office visit to  assess his aortic root, his aortic valve, and to get another assessment  of his left ventricular function.  This will also give [place] a look at his  myocardium and rule out any other process that may be contributing to  his mild global LV dysfunction.  The patient will be discharged to home  today.      Verne Carrow, MD  Electronically Signed     CM/MEDQ  D:  08/10/2008  T:  08/10/2008  Job:  [id number]

## 2010-12-18 NOTE — Assessment & Plan Note (Signed)
[facility]                            CARDIOLOGY OFFICE NOTE   NAME:Sparrow, KHUP SAPIA                  MRN:          [id number]  DATE:03/31/2008                            DOB:          22-Apr-1956    HISTORY OF PRESENT ILLNESS:  Mr. Skowron is a pleasant 55 year old  Caucasian male with no significant past medical history, who during  routine health screening, underwent a stress echocardiogram in another  office on March 14, 2008.  He was noted to have good exercise capacity  and achieved 11.9 metabolic equivalents without any evidence of coronary  ischemia.  It was during that echocardiogram that he was noted to have a  bicuspid aortic valve. The echo also showed moderate aortic  regurgitation and dilatation of the aorta.  The patient has had no prior  history of heart disease.  He does describe to me today symptoms of  occasional chest pain that does not sound cardiac.  It generally occurs  several times per month when he is at rest and seems like left flank  pain with cramping.  He also notes occasional cramping in his thighs and  calves.  He has noticed no color changes in his feet and has also  noticed no edema or unilateral swelling of his lower extremities.  He  denies any shortness of breath, palpitations, diaphoresis, nausea,  vomiting, near-syncope, or syncope.   PAST MEDICAL HISTORY:  Significant only for tobacco abuse for the last  35 years with approximately 1 and 1-1/2 packs per day.   PAST SURGICAL HISTORY:  Significant for a vasectomy, appendectomy, and  hydrocele repair.   ALLERGIES:  No known drug allergies.   MEDICATIONS:  Multivitamin once daily and ibuprofen p.r.n.   SOCIAL HISTORY:  The patient has smoked 1 and 1-1/2 packs of cigarettes  per day for the last 35 years.  He denies use of alcohol or illicit  drugs.  He is single, but is accompanied today by a significant other.  He has 2 children that are both adult and  are healthy.   FAMILY HISTORY:  The patient's mother died at age of 102 from  complications from severe headaches.  The patient is unsure if she died  from a cerebral aneurysm rupture or other causes.  His father is alive  at 16, but had a mitral valve repair.  He has 1 brother and 1 sister who  are both alive without any coronary artery disease or there is no family  history of sudden cardiac death.   REVIEW OF SYSTEMS:  As stated in the history of present illness is,  otherwise, negative.   PHYSICAL EXAMINATION:  GENERAL:  He is a pleasant, thin, and middle-aged  Caucasian male in no acute distress.  VITAL SIGNS:  Blood pressure 140/90, pulse 65 and regular, respirations  12 and unlabored, and weight 180 pounds.  NECK:  No JVD.  No carotid bruits.  SKIN:  Warm and dry.  HEENT:  Oropharynx, mucous membranes were moist.  LUNGS:  Clear to auscultation bilaterally without wheezes, rhonchi, or  crackles noted.  CARDIOVASCULAR:  Regular rate and  rhythm without any evidence of a  systolic or a diastolic murmur.  There are no gallops or rubs noted.  ABDOMEN:  Soft and nontender.  Bowel sounds are present.  EXTREMITIES:  No evidence of edema in the bilateral lower extremities.  Pulses are 2+ in the bilateral dorsalis pedis and posterior tibial  arteries.  Radial pulses are 2+.  NEUROLOGIC:  There are no focal deficits.   DIAGNOSTIC STUDIES:  1. A 12-lead electrocardiogram obtained in our office today shows      normal sinus rhythm with a ventricular rate of 64 beats per minute      and no evidence of ischemia.  2. Exercise stress echocardiogram performed at [facility]      at [facility], [place] on March 14, 2008.  It shows that      the patient exercised for 10 minutes of the Bruce protocol      achieving 11.9 metabolic equivalents and 96% of his maximum      predicted heart rate for age.  The test was terminated due to the      patient's  fatigue.  There were no  EKG changes consistent with      ischemia.  The patient did have rare PACs noted.  There was normal      contractile reserve with no regional wall motion abnormalities      noted.  There was no cavity dilatation noted after exercise.  The      EF was estimated at 75% following the exercise.  Of note, during      the stress echocardiogram, a limited study was performed of the      patient's aortic valve and it was noted to be bicuspid with      moderate aortic regurgitation.  3. Laboratory values from an outside hospital performed on March 02, 2008, show a potassium of 4.3, calcium 9.4, creatinine 1.0,      hemoglobin 14.1, white blood cell count 10.7, and platelets 305.      Total cholesterol 186, triglyceride 79, HDL 43, and LDL 127.   ASSESSMENT AND PLAN:  This is a pleasant 55 year old Caucasian male with  no significant past medical history, who was found recently on a stress  echocardiogram to have a possible bicuspid aortic valve with moderate  aortic insufficiency.  The patient describes occasional cramping in his  lower extremities and occasional atypical chest pain.  I would like to  began today by performing a 2-D surface echocardiogram to better assess  his aortic valve here in our office and also to better define the amount  of aortic insufficiency.  I will go ahead and schedule that to be  performed within the next few days.  I will also get an ankle brachial  index to assess the arterial flow in his lower extremities.  The patient  was given a prescription for amoxicillin today to take prior to his  dentist appointment next week.  I will plan on seeing him back in my  office in 2-3 weeks to follow up the results of the studies.     Verne Carrow, MD  Electronically Signed    CM/MedQ  DD: 03/31/2008  DT: 04/01/2008  Job #: [id number]   cc:   Virl Axe, MD, [facility]

## 2010-12-18 NOTE — Assessment & Plan Note (Signed)
[facility]                            CARDIOLOGY OFFICE NOTE   NAME:Aamodt, JADIN CREQUE                  MRN:          [id number]  DATE:07/11/2008                            DOB:          [birth year]    HISTORY OF PRESENT ILLNESS:  Mr. Christoffel is a pleasant 55 year old  Caucasian male with a past medical history significant for tobacco abuse  and recently diagnosed aortic valve insufficiency, who was initially  seen in this office on March 31, 2008 for evaluation of his aortic  insufficiency that was discovered on a routine health screening stress  echocardiogram in another office.  Echocardiogram in September 2009  showed a bicuspid aortic valve with mild-to-moderate aortic  insufficiency, mildly enlarged left ventricle with an end-systolic  diameter of 4.1 cm, and mildly decreased left ventricular systolic  function with an ejection fraction of 45-50%.  The patient was also  noted to have a mildly dilated aortic root during that echocardiogram.  At that time, the patient denied any shortness of breath, palpitations,  diaphoresis, nausea, vomiting, near syncope, or syncope.  He has  complained of some atypical-type chest discomfort over the last few  months.  He returns today for repeat echocardiogram and evaluation.  He  tells me that he has been going through much stress at home since his  girlfriend has been admitted to [facility] for the last 2  weeks with C. diff colitis requiring a colectomy.  He once again  describes dull left-sided chest pain that has been constant over the  last 2 months.  There is no waxing and waning of the pain.  There is no  association with shortness of breath, diaphoresis, or palpitations.  He  denies any syncopal or near-syncopal events.  He once again complains of  some chronic back pain, which is unchanged in nature.   He had an echocardiogram performed this morning, which will be reviewed  in detail  below.   PAST MEDICAL HISTORY:  Unchanged.  It is only significant for tobacco  abuse for the last 35 years and aortic valve insufficiency with finding  of a bicuspid aortic valve.   MEDICATIONS:  His only medications include multivitamin once daily,  magnesium once daily, and melatonin once daily.   REVIEW OF SYSTEMS:  As stated in the history of present illness, is  otherwise negative.   PHYSICAL EXAMINATION:  VITALS:  Blood pressure 126/80, pulse 71 and  regular, respirations 12 and unlabored, and weight 180 pounds.  GENERAL:  He is a pleasant middle-aged Caucasian male in no acute  distress.  He is alert and oriented x3.  SKIN:  Warm and dry.  HEENT:  Normal.  PSYCHIATRIC:  Mood and affect are normal.  MUSCULOSKELETAL:  Muscle strength and tone is normal.  NEUROLOGICAL:  No focal neurological deficits.  NECK:  No JVD.  No carotid bruits.  No thyromegaly.  No lymphadenopathy.  LUNGS:  Clear to auscultation bilaterally without wheezes, rhonchi, or  crackles noted.  CARDIOVASCULAR:  Regular rate and rhythm with normal first and second  heart sounds.  There is a faint diastolic murmur  noted at the right  upper sternal border.  There are no gallops or rubs noted.  There are no  lifts or thrills noted.  ABDOMEN:  Soft and nontender.  Bowel sounds are present.  EXTREMITIES:  No evidence of edema.  Pulses are 2+ in all extremities.   DIAGNOSTIC STUDIES:  1. The patient underwent a transthoracic echocardiogram today.  The      official report is not yet available; however, I did review this      with Dr. Tenny Craw during the patient's visit.  His overall left      ventricular systolic function is mildly decreased with an ejection      fraction estimated at 45%.  The aortic valve is noted to be      bicuspid.  There is moderate aortic valvular regurgitation noted.      The aortic regurgitation jet was eccentric.  There is aortic root      dilation of 4.7 cm.  The end-systolic diameter of  the left      ventricle is 4.6 cm.  2. A 12-lead EKG shows normal sinus rhythm with a ventricular rate of      71 beats per minute and a nonspecific intraventricular conduction      delay.   ASSESSMENT AND PLAN:  Mr. Alcindor is a pleasant 55 year old Caucasian  male who has been found to have a bicuspid aortic valve with moderate  aortic insufficiency, mildly decreased left ventricular systolic  function with mild dilation of the left ventricular cavity and dilation  of the aortic root.  Mr. Mccaughey has been doing fairly well; however,  he does describe some atypical-type chest pain.  I think at this time,  it will be most appropriate to proceed with a diagnostic left heart  catheterization to assess his coronary arteries as well as to assess his  aortic root and his left ventricle.  I have discussed with him that he  is probably approaching a point, where surgical replacement of the  aortic valve will be necessary.  He would like to postpone the left  heart catheterization for several weeks, since his girlfriend is  currently in the hospital.  He will call our office back later this week  to set up a time to do the heart catheterization.  We will make further  plans at that time.     Verne Carrow, MD  Electronically Signed    CM/MedQ  DD: 07/11/2008  DT: 07/11/2008  Job #: [id number]

## 2010-12-18 NOTE — Assessment & Plan Note (Signed)
OFFICE VISIT   [name], [name]  DOB:  1955-11-11                                        October 02, 2009  CHART #:  [id number]   HISTORY OF PRESENT ILLNESS:  The patient returns to the office today for  followup status post biological Bentall aortic root replacement on  May 10, 2009, with a pericardial tissue valve and Gelweave Valsalva  synthetic aortic conduit.  His postoperative recovery has been entirely  uncomplicated.  The patient was last seen here in the office on June 05, 2009.  Since then, he has done well.  At this point, he has no  physical restrictions whatsoever.  He has no residual soreness in his  chest.  He has no shortness of breath with activity and his exercise  tolerance is at least as good as it was prior to surgery.  He has  continued to abstain from any tobacco use.  Overall, he is getting along  fine and he has no problems or complaints whatsoever.  The remainder of  his review of systems is uncomplicated and his past medical history is  unchanged.   CURRENT MEDICATIONS:  Aspirin, Lopressor, multivitamin, Neurontin,  Metanx.   PHYSICAL EXAMINATION:  Notable for well-appearing male with blood  pressure 130/86, pulse 77, oxygen saturation 99% on room air.  Examination of the chest reveals median sternotomy scar that has healed  nicely.  The sternum is stable on palpation.  Breath sounds are clear to  auscultation and symmetrical bilaterally.  No wheezes or rhonchi are  noted.  Cardiovascular exam includes regular rate and rhythm.  No  murmurs, rubs, or gallops are noted.  The abdomen is soft, nontender.  The extremities are warm and well perfused.  There is no lower extremity  edema.   IMPRESSION:  Excellent progress status post biological Bentall aortic  root replacement in October 2010.  The patient is doing just fine.   PLAN:  In the future, the patient will call and return to see [place] as  needed.  All of his  questions have been addressed.   Salvatore Decent. Cornelius Moras, M.D.  Electronically Signed   CHO/MEDQ  D:  10/02/2009  T:  10/03/2009  Job:  [id number]   cc:   Verne Carrow, MD  Almedia Balls, MD  G. Dorene Grebe, M.D.

## 2010-12-18 NOTE — Assessment & Plan Note (Signed)
[facility]                            CARDIOLOGY OFFICE NOTE   NAME:Cost, MONIQUE GIFT                  MRN:          [id number]  DATE:04/19/2008                            DOB:          04-19-1956    HISTORY OF PRESENT ILLNESS:  Mr. Brannen is a pleasant 55 year old  Caucasian male with past medical history only significant for tobacco  abuse who initially presented to my office in March 31, 2008 for  further evaluation of an abnormal echocardiogram.  The patient had been  undergoing a routine health screening stress echocardiogram in another  office on August 2009.  He was noted to have a good exercise capacity  and achieved a 11.9 metabolic equivalents without any evidence of  coronary ischemia.  During the study, he was incidentally noted to have  a bicuspid aortic valve with some degree of aortic regurgitation and  dilatation of the aorta.  During the last visit, he also noted  occasional left flank pain and also pain in both of his thighs and  calves when he walks.  At that time, he denied any shortness of breath,  palpitations, diaphoresis, nausea, vomiting, near syncope or syncope.  I  felt like it was most reasonable at that time to proceed with  noninvasive arterial studies of his lower extremities and also a  dedicated transthoracic echocardiogram to better assess his aortic valve  and aortic insufficiency.  He returns today for a visit to review the  studies that were performed.  Today, he states that he has had no  further left flank pain, chest pain, shortness of breath, PND,  orthopnea, lower extremity edema, dizziness, or palpitations.  He has  also seen an Orthopedic surgeon for his leg pain and has a planned MRI  of his lower back for next week.   PAST MEDICAL HISTORY:  Unchanged and is only significant for tobacco  abuse for the last 35 years with approximately one to one and half packs  per day used.   His only medications  include multivitamins once daily, melatonin once  daily, and magnesium once daily.   REVIEW OF SYSTEMS:  As stated in the history of present illness and is  otherwise negative.   PHYSICAL EXAMINATION:  GENERAL:  He is a pleasant middle-aged Caucasian  male in no acute distress.  VITAL SIGNS:  Weight 181 pounds, blood pressure 130/86, pulse 72 and  regular, respirations 12, and unlabored.  NECK:  No JVD.  No carotid bruits.  SKIN:  Warm and dry.  Oropharynx, mucous membranes are moist.  LUNGS:  Clear to auscultation bilaterally.  CARDIOVASCULAR:  Regular rate and rhythm with normal first and second  heart sounds.  There is a faint diastolic murmur noted at the right  upper sternal border.  There are no gallops or rubs noted.  There are no  lifts or thrills noted.  ABDOMEN:  Soft and nontender.  Bowel sounds are present.  EXTREMITIES:  No evidence of edema.  Pulses are 2+ in all extremities.   DIAGNOSTIC STUDIES:  1. Transthoracic echocardiogram performed on April 12, 2008 shows  that the left ventricle was mildly dilated with an end-systolic      dimension of 40.5 mm.  Overall, left ventricular systolic function      was mildly decreased with an ejection fraction of 45-50%.  There      appeared to be hypokinesis of the mid-to-distal septal wall.  The      aortic valve was noted to be bicuspid.  Aortic valve thickness was      mildly increased.  There was mild-to-moderate aortic valvular      regurgitation noted.  Aortic regurgitation jet was eccentric.  The      mean transaortic valve gradient was 7 mmHg.  There was mild aortic      root dilatation of 39 mm.  There was mild ascending aortic      dilatation as well.  The inferior vena cava was dilated.  2. Under diagnostic studies bilateral lower extremity arterial      Dopplers with no evidence of segmental lower extremity arterial      disease bilaterally.  ABIs were normal and were noted to be 1.3 on      the right and 1.2  on the left.   ASSESSMENT AND PLAN:  This is a pleasant 55 year old Caucasian male with  no significant past medical history of tobacco abuse who has been found  now to have a bicuspid aortic valve with mild-to-moderate aortic  insufficiency, mildly dilated left ventricle, mildly dilated aortic  root, and mildly depressed left ventricular function.  The patient is  asymptomatic at this time.  I think it will be most reasonable to repeat  an echocardiogram in 3 months and see him back in the office after that  study is performed.  We will closely follow the dimensions of his left  ventricle, his clinical presentation, and also the amount of aortic  insufficiency that is present.  The patient is aware that he should let  us know should he have any change in his symptom complex.  I will see  him back in the office in 3 months from today.     Verne Carrow, MD  Electronically Signed    CM/MedQ  DD: 04/19/2008  DT: 04/19/2008  Job #: [id number]

## 2010-12-18 NOTE — Assessment & Plan Note (Signed)
OFFICE VISIT   [name], [name]  DOB:                                                    June 05, 2011  CHART #:  [id number]   HISTORY:  The patient returns to the office today for routine followup  status post Bentall aortic root replacement using a biological  pericardial tissue valve and synthetic aortic graft on May 10, 2009.  His postoperative recovery has been entirely uneventful.  Following  hospital discharge, he has continued to recover without problems.  He  did develop pain followed by numbness on the plantar surface of both  feet in a symmetrical distribution.  He ultimately saw Dr. Dorene Grebe  for evaluation and has been diagnosed with probable neuropathy.  The  symptoms have already improved and now the pain has essentially gone and  he complains only of some mild numbness.  These symptoms are constant,  mild in severity, and entirely symmetrical on the soles of both feet.  The patient has mild residual soreness in his chest and he is no longer  taking any oral narcotic pain relievers for that.  He denies any fevers,  chills, or productive cough.  His appetite is good.  His exercise  tolerance is remarkably good.  He has no shortness of breath.  The  remainder of his review of systems is unremarkable.  The remainder of  his past medical history is unchanged.   CURRENT MEDICATIONS:  Aspirin, Lopressor, multivitamin, Neurontin,  Metanx.   PHYSICAL EXAMINATION:  Notable for a well-appearing male with blood  pressure 126/84, pulse 71, oxygen saturation 99% on room air.  Examination of the chest reveals a median sternotomy incision that is  healing quite nicely.  The sternum is stable on palpation.  Breath  sounds are clear to auscultation and symmetrical bilaterally.  No  wheezes, rales, or rhonchi are noted.  Cardiovascular exam includes  regular rate and rhythm.  No murmurs, rubs, or gallops are noted.  The  abdomen is soft and  nontender.  The extremities are warm and well  perfused.  There is no lower extremity edema.  Examination of the  plantar surface of the feet is notable for normal-appearing skin with no  sign of any skin discoloration or abnormalities to suggest embolic  phenomenon.  Pulses are palpable.   DIAGNOSTIC TESTS:  Chest x-ray performed today at the [facility] is reviewed.  This demonstrates clear lung fields bilaterally.  There are no pleural effusions.  There is no atelectasis.  All the  sternal wires appear intact.  The heart size is normal.  No other  abnormalities are noted.   IMPRESSION:  Excellent progress following recent biological Bentall  aortic root replacement.   PLAN:  I have encouraged the patient to continue to gradually increase  his physical activity in the coming weeks.  He has been cautioned to  avoid any heavy lifting or strenuous use of his arms or shoulders for at  least another 2-3 months.  I think that once he gets to the point where  he no longer uses any oral narcotic pain relievers he can probably  resume driving an automobile.  All of his questions have been addressed.  We will plan to see him back in 3 months to make sure  he continues to  recover uneventfully.  We have not recommended any changes in his  medications.   Salvatore Decent. Cornelius Moras, M.D.  Electronically Signed   CHO/MEDQ  D:  06/05/2009  T:  06/05/2009  Job:  [id number]   cc:   Verne Carrow, MD  Frederich Chick Dorene Grebe, M.D.

## 2010-12-21 NOTE — Assessment & Plan Note (Signed)
[facility]                            CARDIOLOGY OFFICE NOTE   NAME:[name]                  MRN:          [id number]  DATE:09/19/2008                            DOB:          11-23-1955    I called today and spoke with [name] about the results of his  cardiac MRI.  [name] is a 55 year old Caucasian male who had been  following in my office this year for moderate aortic insufficiency.  The  patient has a known bicuspid aortic valve with echocardiogram showing an  ejection fraction slightly depressed at 45-50%.  There was some  suggestion of segmental left ventricular dysfunction, and because of  this we performed a left heart catheterization on August 10, 2008, that  demonstrated normal coronary arteries.  The aortic root angiogram during  the catheterization showed a mildly dilated aortic root with at least  moderate aortic insufficiency.  The patient had some complaints of  atypical midsternal chest pain.  Because of this, I thought it was  important to obtain a cardiac MRI to assess his aortic root as well as  his ascending aorta.  I felt this would also give [place] good information  about the severity of his aortic insufficiency and also a good way to  size his left ventricle and look at the overall function.  Cardiac MRI  was performed on August 30, 2008, and showed moderate left ventricular  cavity enlargement with global hypokinesis that was slightly worse in  the posterolateral wall.  The EF was calculated at 44%.  There was  moderate aortic insufficiency with a bicuspid aortic valve noted.  There  was moderate aortic root dilatation with no evidence of penetrating  ulcer or dissection.  The great vessels arose normally from the aortic  arch.  The descending thoracic aortic root was 4.5 x 4.3 cm axially.  The descending thoracic aorta was 2.7 x 2.7 cm axially.  The sinus of  Valsalva was 4.2 cm.   I had initially asked  [name] to come into my office to further  discuss his MRI findings and talk about a possible referral to one of  our surgical colleagues.  [name] did not wish to come in for an  office visit to discuss this.  I spoke to him on the phone today and  told him that I felt that he was in a position where he may benefit from  an aortic valve replacement.  I am concerned overall with the bicuspid  aortic valve as well as the aortic root dilatation, mild left  ventricular dysfunction, and dilated left ventricular cavity.  Mr.  [name] tells me that he is willing to have surgery, but would like to  wait until later in the year.  He has many stressors at home now with  his fiancee being ill.  He also has stress at work and does not want to  take time off from work now.  I did tell him that in my opinion we  should proceed to the surgery as soon as he is willing to do so.  I  would like to refer him to one of my surgical colleagues when he is  ready to consider a surgery.  As of now, we will tentatively schedule an  appointment for 4-5 months from now and we will have him come in at that  time.  He is to let us know if he has any changes in his clinical status  in the meantime.  Currently, he tells me that he is asymptomatic.     Verne Carrow, MD  Electronically Signed    CM/MedQ  DD: 09/19/2008  DT: 09/20/2008  Job #: [id number]

## 2011-07-02 ENCOUNTER — Encounter: Payer: Self-pay | Admitting: Cardiovascular Disease

## 2011-07-02 ENCOUNTER — Ambulatory Visit (INDEPENDENT_AMBULATORY_CARE_PROVIDER_SITE_OTHER): Payer: Managed Care, Other (non HMO) | Admitting: Cardiovascular Disease

## 2011-07-02 VITALS — BP 146/90 | HR 79 | Ht 72.0 in | Wt 181.0 lb

## 2011-07-02 DIAGNOSIS — I359 Nonrheumatic aortic valve disorder, unspecified: Secondary | ICD-10-CM

## 2011-07-02 NOTE — Patient Instructions (Signed)
Your physician wants you to follow-up in:  12 months.  You will receive a reminder letter in the mail two months in advance. If you don't receive a letter, please call our office to schedule the follow-up appointment.   

## 2011-07-02 NOTE — Progress Notes (Signed)
History of Present Illness: 55 yo WM with history of bicuspid aortic valve now s/p aortic valve repair with aortic root replacement by Dr. Cornelius Moras 05/10/09 here today for follow up. I last saw him November  2011.  Echo November 2012 with LVEF of 50%, normal functioning bioprosthetic aortic valve.   He has been doing well. He has no shortness of breath. He has been feeling well. He has been riding his bike 150 miles per week. He has had several episodes of dizziness while climbing hills on his bike but no syncope. He has been checking his BP and it has been ok at home.   Past Medical History  Diagnosis Date  . Aortic valve stenosis     s/p AVR 2010    Past Surgical History  Procedure Date  . Vasectomy   . Appendectomy   . Hydrocele excision / repair   . Aortic valve replacement     Current Outpatient Prescriptions  Medication Sig Dispense Refill  . aspirin 81 MG tablet Take 81 mg by mouth daily.        . fish oil-omega-3 fatty acids 1000 MG capsule Take 2 g by mouth daily.        . Multiple Vitamin (MULTIVITAMIN) tablet Take 1 tablet by mouth daily.          No Known Allergies  History   Social History  . Marital Status: Single    Spouse Name: N/A    Number of Children: N/A  . Years of Education: N/A   Occupational History  . Not on file.   Social History Main Topics  . Smoking status: Not on file  . Smokeless tobacco: Not on file  . Alcohol Use:   . Drug Use:   . Sexually Active:    Other Topics Concern  . Not on file   Social History Narrative   Tobacco use 1 1/2 ppd for 35 years- stopped 2010No alcohol No illicit drugsSingle 2 children    No family history on file.  Review of Systems:  As stated in the HPI and otherwise negative.   BP 146/90  Pulse 79  Ht 6' (1.829 m)  Wt 181 lb (82.101 kg)  BMI 24.55 kg/m2  Physical Examination: General: Well developed, well nourished, NAD HEENT: OP clear, mucus membranes moist SKIN: warm, dry. No rashes. Neuro: No  focal deficits Musculoskeletal: Muscle strength 5/5 all ext Psychiatric: Mood and affect normal Neck: No JVD, no carotid bruits, no thyromegaly, no lymphadenopathy. Lungs:Clear bilaterally, no wheezes, rhonci, crackles Cardiovascular: Regular rate and rhythm. No murmurs, gallops or rubs. Abdomen:Soft. Bowel sounds present. Non-tender.  Extremities: No lower extremity edema. Pulses are 2 + in the bilateral DP/PT.  EKG: NSR, rate 79 bpm. RBBB.

## 2011-07-02 NOTE — Assessment & Plan Note (Signed)
Stable. He is doing great. No changes.

## 2011-07-17 ENCOUNTER — Telehealth: Payer: Self-pay | Admitting: Cardiovascular Disease

## 2011-07-17 NOTE — Telephone Encounter (Signed)
New msg Pt was calling about his BP. He wouldn't explain how high it has been. He wants to talk to a nurse. Please call

## 2011-07-17 NOTE — Telephone Encounter (Signed)
Patient called no answer.LMTC. 

## 2011-07-18 NOTE — Telephone Encounter (Signed)
Tenormin 25 mg po Qdaily will be ok with me. Thanks, chris

## 2011-07-18 NOTE — Telephone Encounter (Signed)
Spoke with patient's wife and she is afraid for patient to take Norvasc.Wants patient to take what he took after his heart surgery 2 years ago,which is Tenormin,he did well with Tenormin had no side effects.Fowarded to Dr. Clifton James for advice.

## 2011-07-18 NOTE — Telephone Encounter (Signed)
Can we start him on Norvasc 5 mg po Qdaily? Thanks, chris

## 2011-07-18 NOTE — Telephone Encounter (Signed)
Patient called no answer.LMTC. 

## 2011-07-18 NOTE — Telephone Encounter (Signed)
Patient was called stating his blood pressure continues to run high.States has been nothing less than 130/90.Patient wants to know if he needs medication.Fowarded to Dr. Clifton James for advice.

## 2011-07-19 NOTE — Telephone Encounter (Signed)
Left message to call back  

## 2011-07-24 ENCOUNTER — Telehealth: Payer: Self-pay | Admitting: Cardiovascular Disease

## 2011-07-24 MED ORDER — ATENOLOL 25 MG PO TABS: 25.0000 mg | ORAL_TABLET | Freq: Every day | ORAL | Status: DC

## 2011-07-24 NOTE — Telephone Encounter (Signed)
Addended byMeryl Crutch, Elease Hashimoto L on: 07/24/2011 03:00 PM   Modules accepted: Orders

## 2011-07-24 NOTE — Telephone Encounter (Signed)
Addended by: Dossie Arbour on: 07/24/2011 02:51 PM   Modules accepted: Orders

## 2011-07-24 NOTE — Telephone Encounter (Signed)
Spoke with pt.  See phone note dated 07/17/11 that addresses this.

## 2011-07-24 NOTE — Telephone Encounter (Signed)
Spoke with pt and gave him instructions from Dr. Clifton James to start tenormin 25 mg by mouth daily. Will send to [facility] on [address].  Pt will check blood pressure daily and call us back in 2 weeks if it still remains elevated.

## 2011-07-24 NOTE — Telephone Encounter (Signed)
Fu call Pt was upset because he hasnt heard anything about BP med. Please call him back He wants it called to [facility] on [address]

## 2012-02-17 ENCOUNTER — Other Ambulatory Visit: Payer: Self-pay | Admitting: Cardiovascular Disease

## 2012-02-17 MED ORDER — ATENOLOL 25 MG PO TABS: 25.0000 mg | ORAL_TABLET | Freq: Every day | ORAL | Status: DC

## 2012-07-23 ENCOUNTER — Encounter: Payer: Self-pay | Admitting: Cardiovascular Disease

## 2012-07-23 ENCOUNTER — Ambulatory Visit (INDEPENDENT_AMBULATORY_CARE_PROVIDER_SITE_OTHER): Payer: Managed Care, Other (non HMO) | Admitting: Cardiovascular Disease

## 2012-07-23 VITALS — BP 118/80 | HR 61 | Ht 72.0 in | Wt 182.0 lb

## 2012-07-23 DIAGNOSIS — I359 Nonrheumatic aortic valve disorder, unspecified: Secondary | ICD-10-CM

## 2012-07-23 NOTE — Patient Instructions (Addendum)
Your physician wants you to follow-up in:  12 months.You will receive a reminder letter in the mail two months in advance. If you don't receive a letter, please call our office to schedule the follow-up appointment.  Your physician has requested that you have an echocardiogram. Echocardiography is a painless test that uses sound waves to create images of your heart. It provides your doctor with information about the size and shape of your heart and how well your heart's chambers and valves are working. This procedure takes approximately one hour. There are no restrictions for this procedure. To be done in 12 months prior to appt with Dr. Clifton James

## 2012-07-23 NOTE — Progress Notes (Signed)
   History of Present Illness: 56 yo WM with history of bicuspid aortic valve now s/p aortic valve repair with aortic root replacement by Dr. Cornelius Moras 05/10/09 here today for follow up. Echo November 2012 with LVEF of 50%, normal functioning bioprosthetic aortic valve. He has a chronic RBBB.   He has been doing well. He has no shortness of breath. He has been feeling well. He has been riding his bike 150 miles per week. He has been checking his BP and it has been ok at home.   Primary Care Physician: Jenita Seashore  Past Medical History  Diagnosis Date  . Aortic valve stenosis     s/p AVR 2010    Past Surgical History  Procedure Date  . Vasectomy   . Appendectomy   . Hydrocele excision / repair   . Aortic valve replacement     Current Outpatient Prescriptions  Medication Sig Dispense Refill  . aspirin 81 MG tablet Take 81 mg by mouth daily.        [name] atenolol (TENORMIN) 25 MG tablet Take 1 tablet (25 mg total) by mouth daily.  30 tablet  6  . fish oil-omega-3 fatty acids 1000 MG capsule Take 2 g by mouth daily.        [name] HYDROcodone-acetaminophen (VICODIN) 5-500 MG per tablet Take 1 tablet by mouth every 6 (six) hours as needed.      . Multiple Vitamin (MULTIVITAMIN) tablet Take 1 tablet by mouth daily.        [name] zolpidem (AMBIEN) 10 MG tablet Take 10 mg by mouth at bedtime as needed.        No Known Allergies  History   Social History  . Marital Status: Single    Spouse Name: N/A    Number of Children: N/A  . Years of Education: N/A   Occupational History  . Not on file.   Social History Main Topics  . Smoking status: Former Games developer  . Smokeless tobacco: Not on file  . Alcohol Use: Not on file  . Drug Use: Not on file  . Sexually Active: Not on file   Other Topics Concern  . Not on file   Social History Narrative   Tobacco use 1 1/2 ppd for 35 years- stopped 2010No alcohol No illicit drugsSingle 2 children    No family history on file.  Review of Systems:  As stated  in the HPI and otherwise negative.   BP 118/80  Pulse 61  Ht 6' (1.829 m)  Wt 182 lb (82.555 kg)  BMI 24.68 kg/m2  Physical Examination: General: Well developed, well nourished, NAD HEENT: OP clear, mucus membranes moist SKIN: warm, dry. No rashes. Neuro: No focal deficits Musculoskeletal: Muscle strength 5/5 all ext Psychiatric: Mood and affect normal Neck: No JVD, no carotid bruits, no thyromegaly, no lymphadenopathy. Lungs:Clear bilaterally, no wheezes, rhonci, crackles Cardiovascular: Regular rate and rhythm. No murmurs, gallops or rubs. Abdomen:Soft. Bowel sounds present. Non-tender.  Extremities: No lower extremity edema. Pulses are 2 + in the bilateral DP/PT.  EKG: NSR, rate 61 bpm. RBBB.  Assessment and Plan:   1. AORTIC VALVE STENOSIS s/p AVR: Stable. He is doing great. No changes.

## 2012-12-02 ENCOUNTER — Other Ambulatory Visit (HOSPITAL_COMMUNITY): Payer: Self-pay | Admitting: Urology

## 2012-12-02 DIAGNOSIS — N4340 Spermatocele of epididymis, unspecified: Secondary | ICD-10-CM

## 2012-12-16 ENCOUNTER — Ambulatory Visit (HOSPITAL_COMMUNITY)
Admission: RE | Admit: 2012-12-16 | Discharge: 2012-12-16 | Disposition: A | Discharge status: Home / Self Care | Payer: Managed Care, Other (non HMO) | Source: Ambulatory Visit | Attending: Urology | Admitting: Urology

## 2012-12-16 DIAGNOSIS — N508 Other specified disorders of male genital organs: Secondary | ICD-10-CM | POA: Insufficient documentation

## 2012-12-16 DIAGNOSIS — N433 Hydrocele, unspecified: Secondary | ICD-10-CM | POA: Insufficient documentation

## 2012-12-16 DIAGNOSIS — Z9852 Vasectomy status: Secondary | ICD-10-CM | POA: Insufficient documentation

## 2012-12-16 DIAGNOSIS — N4340 Spermatocele of epididymis, unspecified: Secondary | ICD-10-CM

## 2012-12-22 ENCOUNTER — Telehealth: Payer: Self-pay | Admitting: Cardiovascular Disease

## 2012-12-22 MED ORDER — ATENOLOL 25 MG PO TABS: 25.0000 mg | ORAL_TABLET | Freq: Every day | ORAL | Status: DC

## 2012-12-22 NOTE — Telephone Encounter (Signed)
New Problem:    Called in wanting to know if Dr. Clifton James would clear the patient for an upcoming surgical procedure or if he needed to be seen.  Please call back.

## 2012-12-22 NOTE — Telephone Encounter (Signed)
This is Dr McAlhany's patient. 

## 2012-12-22 NOTE — Telephone Encounter (Signed)
Patient is needing ok for day surgery by Dr Margarita Grizzle, urologist. Evaluation for possible bladder cancer. Will forward to Ernestine Mcmurray RN and Dr  Clifton James for review.

## 2012-12-22 NOTE — Telephone Encounter (Signed)
If he is doing well, he does not need to come in to see me before surgery. Can we find out if we need to send a letter to her urologist or is there a form asking for clearance? Thayer Ohm

## 2012-12-23 ENCOUNTER — Telehealth: Payer: Self-pay | Admitting: *Deleted

## 2012-12-23 ENCOUNTER — Encounter: Payer: Self-pay | Admitting: Cardiovascular Disease

## 2012-12-23 NOTE — Telephone Encounter (Signed)
See letter. cdm 

## 2012-12-23 NOTE — Telephone Encounter (Signed)
Spoke with Elnita Maxwell (pt's significant other) who reports pt is doing well from a cardiac standpoint. No chest pain or shortness of breath. Very active riding his bike.  I told Elnita Maxwell pt would not need appt with Dr. Clifton James prior to surgery and Dr Clifton James would send letter to Dr. Margarita Grizzle.

## 2012-12-23 NOTE — Telephone Encounter (Signed)
Follow up   Pt returning your call but will be going to a meeting in .

## 2012-12-23 NOTE — Telephone Encounter (Signed)
Left message for pt to call back  °

## 2012-12-24 ENCOUNTER — Telehealth: Payer: Self-pay | Admitting: Cardiovascular Disease

## 2012-12-24 NOTE — Telephone Encounter (Signed)
Medical records will fax letter today

## 2012-12-24 NOTE — Telephone Encounter (Signed)
Received request from Nurse, documents faxed for surgical clearance. To: [facility] Fax number: [phone] Attention:Dr.Woodruff Cardiac Clearance Note Faxed. 12/24/12/KM

## 2012-12-25 ENCOUNTER — Encounter (HOSPITAL_BASED_OUTPATIENT_CLINIC_OR_DEPARTMENT_OTHER): Payer: Self-pay | Admitting: *Deleted

## 2012-12-25 ENCOUNTER — Other Ambulatory Visit: Payer: Self-pay | Admitting: Urology

## 2012-12-29 ENCOUNTER — Encounter (HOSPITAL_BASED_OUTPATIENT_CLINIC_OR_DEPARTMENT_OTHER): Payer: Self-pay | Admitting: *Deleted

## 2012-12-29 NOTE — Progress Notes (Signed)
NPO AFTER MN WITH EXCEPTION WATER/ GATORADE UNTIL 0700.  ARRIVES AT 1145. NEEDS HG AND EKG. WILL TAKE ATENOLOL AM OF SURG W/ SIP OF WATER.

## 2012-12-30 NOTE — Telephone Encounter (Signed)
Opened in error

## 2012-12-31 ENCOUNTER — Ambulatory Visit (HOSPITAL_BASED_OUTPATIENT_CLINIC_OR_DEPARTMENT_OTHER)
Admission: RE | Admit: 2012-12-31 | Discharge: 2012-12-31 | Disposition: A | Discharge status: Home / Self Care | Payer: Managed Care, Other (non HMO) | Source: Ambulatory Visit | Attending: Urology | Admitting: Urology

## 2012-12-31 ENCOUNTER — Ambulatory Visit (HOSPITAL_BASED_OUTPATIENT_CLINIC_OR_DEPARTMENT_OTHER): Payer: Managed Care, Other (non HMO) | Admitting: Anesthesiology

## 2012-12-31 ENCOUNTER — Encounter (HOSPITAL_BASED_OUTPATIENT_CLINIC_OR_DEPARTMENT_OTHER)
Admission: RE | Disposition: A | Discharge status: Home / Self Care | Payer: Self-pay | Source: Ambulatory Visit | Attending: Urology

## 2012-12-31 ENCOUNTER — Encounter (HOSPITAL_BASED_OUTPATIENT_CLINIC_OR_DEPARTMENT_OTHER): Payer: Self-pay

## 2012-12-31 ENCOUNTER — Encounter (HOSPITAL_BASED_OUTPATIENT_CLINIC_OR_DEPARTMENT_OTHER): Payer: Self-pay | Admitting: Anesthesiology

## 2012-12-31 DIAGNOSIS — C672 Malignant neoplasm of lateral wall of bladder: Secondary | ICD-10-CM | POA: Insufficient documentation

## 2012-12-31 DIAGNOSIS — I10 Essential (primary) hypertension: Secondary | ICD-10-CM | POA: Insufficient documentation

## 2012-12-31 DIAGNOSIS — Z954 Presence of other heart-valve replacement: Secondary | ICD-10-CM | POA: Insufficient documentation

## 2012-12-31 DIAGNOSIS — D494 Neoplasm of unspecified behavior of bladder: Secondary | ICD-10-CM

## 2012-12-31 DIAGNOSIS — I4510 Unspecified right bundle-branch block: Secondary | ICD-10-CM | POA: Insufficient documentation

## 2012-12-31 DIAGNOSIS — Z87891 Personal history of nicotine dependence: Secondary | ICD-10-CM | POA: Insufficient documentation

## 2012-12-31 HISTORY — DX: Unspecified right bundle-branch block: I45.10

## 2012-12-31 HISTORY — DX: Presence of xenogenic heart valve: Z95.3

## 2012-12-31 HISTORY — PX: CYSTOSCOPY W/ RETROGRADES: SHX1426

## 2012-12-31 HISTORY — DX: Nocturia: R35.1

## 2012-12-31 HISTORY — PX: TRANSURETHRAL RESECTION OF BLADDER TUMOR: SHX2575

## 2012-12-31 HISTORY — DX: Personal history of other diseases of the circulatory system: Z86.79

## 2012-12-31 HISTORY — DX: Frequency of micturition: R35.0

## 2012-12-31 SURGERY — TURBT (TRANSURETHRAL RESECTION OF BLADDER TUMOR)
Anesthesia: General | Site: Ureter | Wound class: Clean Contaminated

## 2012-12-31 MED ORDER — LIDOCAINE HCL (CARDIAC) 20 MG/ML IV SOLN
INTRAVENOUS | Status: DC | PRN
  Administered 2012-12-31: 80 mg via INTRAVENOUS

## 2012-12-31 MED ORDER — SUCCINYLCHOLINE CHLORIDE 20 MG/ML IJ SOLN
INTRAMUSCULAR | Status: DC | PRN
  Administered 2012-12-31 (×2): 80 mg via INTRAVENOUS

## 2012-12-31 MED ORDER — FENTANYL CITRATE 0.05 MG/ML IJ SOLN
INTRAMUSCULAR | Status: DC | PRN
  Administered 2012-12-31: 50 ug via INTRAVENOUS

## 2012-12-31 MED ORDER — OXYBUTYNIN CHLORIDE 5 MG PO TABS: 5.0000 mg | ORAL_TABLET | Freq: Four times a day (QID) | ORAL | Status: DC | PRN

## 2012-12-31 MED ORDER — DIATRIZOATE MEGLUMINE 30 % UR SOLN
URETHRAL | Status: DC | PRN
  Administered 2012-12-31: 300 mL via URETHRAL

## 2012-12-31 MED ORDER — BELLADONNA ALKALOIDS-OPIUM 16.2-60 MG RE SUPP
RECTAL | Status: DC | PRN
  Administered 2012-12-31: 1 via RECTAL

## 2012-12-31 MED ORDER — PROMETHAZINE HCL 25 MG/ML IJ SOLN
6.2500 mg | INTRAMUSCULAR | Status: DC | PRN
  Filled 2012-12-31: qty 1

## 2012-12-31 MED ORDER — HYDROMORPHONE HCL PF 1 MG/ML IJ SOLN
0.2500 mg | INTRAMUSCULAR | Status: DC | PRN
  Administered 2012-12-31 (×3): 0.25 mg via INTRAVENOUS
  Filled 2012-12-31: qty 1

## 2012-12-31 MED ORDER — GLYCOPYRROLATE 0.2 MG/ML IJ SOLN
INTRAMUSCULAR | Status: DC | PRN
  Administered 2012-12-31: 0.2 mg via INTRAVENOUS

## 2012-12-31 MED ORDER — CEFAZOLIN SODIUM-DEXTROSE 2-3 GM-% IV SOLR
2.0000 g | INTRAVENOUS | Status: AC
  Administered 2012-12-31: 2 g via INTRAVENOUS
  Filled 2012-12-31: qty 50

## 2012-12-31 MED ORDER — SENNOSIDES-DOCUSATE SODIUM 8.6-50 MG PO TABS: 1.0000 | ORAL_TABLET | Freq: Two times a day (BID) | ORAL | Status: DC

## 2012-12-31 MED ORDER — LACTATED RINGERS IV SOLN
INTRAVENOUS | Status: DC
  Administered 2012-12-31 (×2): via INTRAVENOUS
  Filled 2012-12-31: qty 1000

## 2012-12-31 MED ORDER — CEPHALEXIN 500 MG PO CAPS: 500.0000 mg | ORAL_CAPSULE | Freq: Three times a day (TID) | ORAL | Status: DC

## 2012-12-31 MED ORDER — SODIUM CHLORIDE 0.9 % IR SOLN
Status: DC | PRN
  Administered 2012-12-31: 6000 mL

## 2012-12-31 MED ORDER — HYDROCODONE-ACETAMINOPHEN 5-325 MG PO TABS: 1.0000 | ORAL_TABLET | ORAL | Status: DC | PRN

## 2012-12-31 MED ORDER — LACTATED RINGERS IV SOLN
INTRAVENOUS | Status: DC
  Filled 2012-12-31: qty 1000

## 2012-12-31 MED ORDER — KETOROLAC TROMETHAMINE 30 MG/ML IJ SOLN
INTRAMUSCULAR | Status: DC | PRN
  Administered 2012-12-31: 30 mg via INTRAVENOUS

## 2012-12-31 MED ORDER — MIDAZOLAM HCL 5 MG/5ML IJ SOLN
INTRAMUSCULAR | Status: DC | PRN
  Administered 2012-12-31: 2 mg via INTRAVENOUS

## 2012-12-31 MED ORDER — HYDROCODONE-ACETAMINOPHEN 5-325 MG PO TABS
1.0000 | ORAL_TABLET | ORAL | Status: DC | PRN
  Administered 2012-12-31: 1 via ORAL
  Filled 2012-12-31: qty 2

## 2012-12-31 MED ORDER — ONDANSETRON HCL 4 MG/2ML IJ SOLN
INTRAMUSCULAR | Status: DC | PRN
  Administered 2012-12-31: 4 mg via INTRAVENOUS

## 2012-12-31 MED ORDER — DEXAMETHASONE SODIUM PHOSPHATE 4 MG/ML IJ SOLN
INTRAMUSCULAR | Status: DC | PRN
  Administered 2012-12-31: 10 mg via INTRAVENOUS

## 2012-12-31 MED ORDER — OXYBUTYNIN CHLORIDE 5 MG PO TABS
5.0000 mg | ORAL_TABLET | Freq: Four times a day (QID) | ORAL | Status: DC | PRN
  Administered 2012-12-31: 5 mg via ORAL
  Filled 2012-12-31: qty 1

## 2012-12-31 MED ORDER — HYOSCYAMINE SULFATE 0.125 MG PO TABS: 0.1250 mg | ORAL_TABLET | ORAL | Status: DC | PRN

## 2012-12-31 MED ORDER — PROPOFOL 10 MG/ML IV BOLUS
INTRAVENOUS | Status: DC | PRN
  Administered 2012-12-31: 300 mg via INTRAVENOUS

## 2012-12-31 MED ORDER — IOHEXOL 350 MG/ML SOLN
INTRAVENOUS | Status: DC | PRN
  Administered 2012-12-31: 8 mL

## 2012-12-31 SURGICAL SUPPLY — 54 items
ADAPTER CATH URET PLST 4-6FR (CATHETERS) IMPLANT
BAG DRAIN URO-CYSTO SKYTR STRL (DRAIN) ×3 IMPLANT
BAG URINE DRAINAGE (UROLOGICAL SUPPLIES) ×3 IMPLANT
BAG URINE LEG 19OZ MD ST LTX (BAG) ×3 IMPLANT
BASKET LASER NITINOL 1.9FR (BASKET) IMPLANT
BASKET STNLS GEMINI 4WIRE 3FR (BASKET) IMPLANT
BASKET ZERO TIP NITINOL 2.4FR (BASKET) IMPLANT
BRUSH URET BIOPSY 3F (UROLOGICAL SUPPLIES) IMPLANT
CANISTER SUCT LVC 12 LTR MEDI- (MISCELLANEOUS) ×6 IMPLANT
CATH FOLEY 2WAY SLVR  5CC 20FR (CATHETERS) ×1
CATH FOLEY 2WAY SLVR  5CC 22FR (CATHETERS)
CATH FOLEY 2WAY SLVR  5CC 24FR (CATHETERS) ×1
CATH FOLEY 2WAY SLVR 5CC 20FR (CATHETERS) ×2 IMPLANT
CATH FOLEY 2WAY SLVR 5CC 22FR (CATHETERS) IMPLANT
CATH FOLEY 2WAY SLVR 5CC 24FR (CATHETERS) ×2 IMPLANT
CATH HEMA 3WAY 30CC 24FR COUDE (CATHETERS) ×3 IMPLANT
CATH HEMA 3WAY 30CC 24FR RND (CATHETERS) IMPLANT
CATH INTERMIT  6FR 70CM (CATHETERS) ×3 IMPLANT
CATH URET 5FR 28IN CONE TIP (BALLOONS)
CATH URET 5FR 28IN OPEN ENDED (CATHETERS) IMPLANT
CATH URET 5FR 70CM CONE TIP (BALLOONS) IMPLANT
CLOTH BEACON ORANGE TIMEOUT ST (SAFETY) ×3 IMPLANT
DRAPE CAMERA CLOSED 9X96 (DRAPES) ×3 IMPLANT
ELECT BUTTON BIOP 24F 90D PLAS (MISCELLANEOUS) IMPLANT
ELECT BUTTON HF 24-28F 2 30DE (ELECTRODE) IMPLANT
ELECT LOOP MED HF 24F 12D (CUTTING LOOP) ×3 IMPLANT
ELECT REM PT RETURN 9FT ADLT (ELECTROSURGICAL)
ELECTRODE REM PT RTRN 9FT ADLT (ELECTROSURGICAL) IMPLANT
EVACUATOR MICROVAS BLADDER (UROLOGICAL SUPPLIES) IMPLANT
GLOVE BIO SURGEON STRL SZ7 (GLOVE) ×12 IMPLANT
GLOVE INDICATOR 7.5 STRL GRN (GLOVE) IMPLANT
GOWN PREVENTION PLUS LG XLONG (DISPOSABLE) IMPLANT
GOWN STRL REIN XL XLG (GOWN DISPOSABLE) IMPLANT
GUIDEWIRE 0.038 PTFE COATED (WIRE) IMPLANT
GUIDEWIRE ANG ZIPWIRE 038X150 (WIRE) IMPLANT
GUIDEWIRE STR DUAL SENSOR (WIRE) ×3 IMPLANT
HOLDER FOLEY CATH W/STRAP (MISCELLANEOUS) IMPLANT
IV NS IRRIG 3000ML ARTHROMATIC (IV SOLUTION) ×3 IMPLANT
KIT ASPIRATION TUBING (SET/KITS/TRAYS/PACK) IMPLANT
KIT BALLIN UROMAX 15FX10 (LABEL) IMPLANT
KIT BALLN UROMAX 15FX4 (MISCELLANEOUS) IMPLANT
KIT BALLN UROMAX 26 75X4 (MISCELLANEOUS)
LASER FIBER DISP (UROLOGICAL SUPPLIES) IMPLANT
LOOP CUTTING 24FR OLYMPUS (CUTTING LOOP) ×3 IMPLANT
NDL SAFETY ECLIPSE 18X1.5 (NEEDLE) ×2 IMPLANT
NEEDLE HYPO 18GX1.5 SHARP (NEEDLE) ×1
PACK CYSTOSCOPY (CUSTOM PROCEDURE TRAY) ×3 IMPLANT
PLUG CATH AND CAP STER (CATHETERS) IMPLANT
SET ASPIRATION TUBING (TUBING) ×3 IMPLANT
SET HIGH PRES BAL DIL (LABEL)
SHEATH URET ACCESS 12FR/35CM (UROLOGICAL SUPPLIES) IMPLANT
SHEATH URET ACCESS 12FR/55CM (UROLOGICAL SUPPLIES) IMPLANT
SYR BULB IRRIGATION 50ML (SYRINGE) ×3 IMPLANT
SYRINGE IRR TOOMEY STRL 70CC (SYRINGE) IMPLANT

## 2012-12-31 NOTE — Anesthesia Procedure Notes (Signed)
Procedure Name: LMA Insertion Date/Time: 12/31/2012 1:00 PM Performed by: Maris Berger T Pre-anesthesia Checklist: Patient identified, Emergency Drugs available, Suction available and Patient being monitored Patient Re-evaluated:Patient Re-evaluated prior to inductionOxygen Delivery Method: Circle System Utilized Preoxygenation: Pre-oxygenation with 100% oxygen Intubation Type: IV induction Ventilation: Mask ventilation without difficulty LMA: LMA inserted LMA Size: 5.0 Number of attempts: 1 Placement Confirmation: positive ETCO2 Dental Injury: Teeth and Oropharynx as per pre-operative assessment  Comments: Gauze roll between teeth

## 2012-12-31 NOTE — H&P (Signed)
Urology History and Physical Exam  CC: Bladder tumor  HPI:  57 year old male presents today for a bladder tumor. This was discovered during workup for microscopic hematuria. This is not associated with gross hematuria. There are no aggravating or alleviating factors. CT scan 12/16/12 revealed no filling defects or hydronephrosis, but it did show a possible tumor on the left side of his bladder. Cystoscopy on 12/22/12 revealed a papillary tumor on the left lateral wall of the bladder as well as a sessile tumor close to it. There was also a possible tumor lateral to the right ureter. He presents today for cystoscopy, transurethral resection of bladder tumor, and bilateral retrograde pyelograms. We have discussed the risk, benefits, alternatives, and likelihood of achieving goals. He has a history of aortic valve replacement.  He was cleared by Dr. Clifton James for surgery. UA on 12/22/12 was negative for signs of infection. We have discussed the risks, benefits, alternatives, and likelihood of achieving goals.  PMH: Past Medical History  Diagnosis Date  . RBBB   . S/P aortic valve replacement with bioprosthetic valve 05-10-2009   DR OWENS    CARDIOLOGIST-  DR Clifton James  . History of aortic insufficiency   . Bladder tumor   . Frequency of urination   . Urgency of urination   . Nocturia     PSH: Past Surgical History  Procedure Laterality Date  . Hydrocele excision / repair  2009  . Cardiac catheterization  08-10-2008  DR MCALHANY    NO EVIDENCE CAD/ MILD GLOBAL LVSF/ MODERATE AORTIC INSUFFICIENCY/ MILD DILATION OF THE AORTIC ROOT/ EF 45-50%  . Biological bentall aortic root replacement w/ pericardial tissue valve and synthetic aortic graft  05-10-2009  DR OWENS    BICUSPID AV W/ SEVERE AI &  ANEURYSM OF AORTIC ROOT AND PROXIMAL ASCENDING THORACIC AORTA  . Transthoracic echocardiogram  NOV 2012  DR MCALHANY    LVEF 50%/ NORMAL FUNCTIONING BIOPROSTHETIC AORTIC VALVE/ CHRONIC RBBB  . Appendectomy   AGE 64    Allergies: No Known Allergies  Medications: No prescriptions prior to admission     Social History: History   Social History  . Marital Status: Single    Spouse Name: N/A    Number of Children: N/A  . Years of Education: N/A   Occupational History  . Not on file.   Social History Main Topics  . Smoking status: Former Smoker -- 2.00 packs/day for 25 years    Types: Cigarettes    Quit date: 12/30/2007  . Smokeless tobacco: Never Used  . Alcohol Use: No  . Drug Use: No  . Sexually Active: Not on file   Other Topics Concern  . Not on file   Social History Narrative   Tobacco use 1 1/2 ppd for 35 years- stopped 2010   No alcohol    No illicit drugs   Single    2 children    Family History: History reviewed. No pertinent family history.  Review of Systems: Positive: None. Negative: Chest pain, fever, or SOB.  A further 10 point review of systems was negative except what is listed in the HPI.  Physical Exam: Filed Vitals:   12/31/12 1200  BP: 112/73  Pulse: 67  Temp: 97.4 F (36.3 C)  Resp: 16    General: No acute distress.  Awake. Head:  Normocephalic.  Atraumatic. ENT:  EOMI.  Mucous membranes moist Neck:  Supple.  No lymphadenopathy. CV:  S1 present. S2 present. Regular rate. Pulmonary: Equal effort bilaterally.  Clear to auscultation bilaterally. Abdomen: Soft.  Non- tender to palpation. Skin:  Normal turgor.  No visible rash. Extremity: No gross deformity of bilateral upper extremities.  No gross deformity of    bilateral lower extremities. Neurologic: Alert. Appropriate mood.    Studies:  No results found for this basename: HGB, WBC, PLT,  in the last 72 hours  No results found for this basename: NA, K, CL, CO2, BUN, CREATININE, CALCIUM, MAGNESIUM, GFRNONAA, GFRAA,  in the last 72 hours   No results found for this basename: PT, INR, APTT,  in the last 72 hours   No components found with this basename: ABG,     Assessment:   Bladder tumor  Plan: To OR for cystoscopy, transurethral resection of bladder tumor, and bilateral retrograde pyelograms.

## 2012-12-31 NOTE — Anesthesia Preprocedure Evaluation (Signed)
Anesthesia Evaluation    Airway Mallampati: II TM Distance: >3 FB Neck ROM: Full    Dental  (+) Caps, Dental Advisory Given and Teeth Intact,    Pulmonary former smoker,  breath sounds clear to auscultation  Pulmonary exam normal       Cardiovascular hypertension, Pt. on home beta blockers + dysrhythmias Rhythm:Regular Rate:Normal  Prior aortic valve replacement 05/2009   Neuro/Psych    GI/Hepatic   Endo/Other    Renal/GU      Musculoskeletal   Abdominal   Peds  Hematology   Anesthesia Other Findings   Reproductive/Obstetrics                           Anesthesia Physical Anesthesia Plan  ASA: III  Anesthesia Plan: General   Post-op Pain Management:    Induction: Intravenous  Airway Management Planned: LMA  Additional Equipment:   Intra-op Plan:   Post-operative Plan: Extubation in OR  Informed Consent: I have reviewed the patients History and Physical, chart, labs and discussed the procedure including the risks, benefits and alternatives for the proposed anesthesia with the patient or authorized representative who has indicated his/her understanding and acceptance.   Dental advisory given  Plan Discussed with: CRNA  Anesthesia Plan Comments:         Anesthesia Quick Evaluation

## 2012-12-31 NOTE — Op Note (Signed)
Urology Operative Report  Date of Procedure: 12/31/12  Surgeon: Natalia Leatherwood, MD Assistant:  None  Preoperative Diagnosis: Bladder tumor Postoperative Diagnosis:  Same  Procedure(s): Cystoscopy TURBT (small tumor <2 cm) Bilateral retrograde pyelogram Cystogram Foley catheter placement  Estimated blood loss: Minimal  Specimen: Bladder tumor and deep bladder biopsy.  Drains: Foley (20Fr)  Complications: None  Findings: Papillary bladder tumor on the left lateral bladder wall. Sessile bladder tumor x2 on the left lateral bladder wall. Sessile tumor on the right lateral bladder wall. Extraperitoneal extravasation of contrast on cystogram.  History of present illness: 57 year old male presented for scrotal discomfort and found to have microscopic hematuria. Further workup revealed bladder tumors. He presents today for transurethral resection of these tumors.   Procedure in detail: After informed consent was obtained, the patient was taken to the operating room. They were placed in the supine position. SCDs were turned on and in place. IV antibiotics were infused, and general anesthesia was induced. A timeout was performed in which the correct patient, surgical site, and procedure were identified and agreed upon by the team.  The patient was placed in a dorsolithotomy position, making sure to pad all pertinent neurovascular pressure points. A belladonna and opium suppository was placed into the rectum. The genitals were prepped and draped in the usual sterile fashion.  I placed a rigid cystoscope and advanced this through the urethra and into the bladder. The bladder was emptied and then fully distended and evaluated in a systematic fashion to visualize the entire surface of the bladder. I used a 30 and 70 lens and noted a papillary bladder tumor on the left lateral bladder wall and 2 sessile bladder tumors on the left lateral bladder wall. There was also a sessile tumor on the  right lateral bladder wall. Neither of these were involving the ureter orifice bilaterally. There were no other tumors elsewhere. These tumors appeared to be less than 2 cm in size.  I removed the cystoscope and attempted to place the visual obturator to the gyrus resectoscope, but this would not fit past the meatus. I then calibrated the urethra meatus from 18 [place] up to 32 [place] with the [employer]. After this the visual obturator passed with ease into the bladder. The loupe resectoscope was used to resect the bladder tumors first I resected the papillary tumor and sent this for first pathologic specimen. I then resected the sessile tumors and sent these as a second pathology specimen. I then used cold cup forceps to biopsy the tissue and sent this as a third pathologic specimen. I then fulgurated the base of the tumor sites and the edges. There was good hemostasis. The bladder wall appeared thin, but I could not visualize any fat. Because of this I plan on proceeding with cystogram.  I turned my attention to the right and left ureter orifices. These were cannulated with a 5 French ureter catheter and I injected contrast to obtain retrograde PolyGram's. There was no hydronephrosis or filling defects in the collecting systems are ureters bilaterally. He sides emptied out well.  I then placed a 20 French Foley catheter with 10 cc of sterile water in the balloon. I obtained a precontrast film, a contrasted film, and then a pursestring film. I filled the bladder by gravity with Cystografin. His bladder would only accommodate approximately 120 cc. There was extravasation in an extraperitoneal fashion. It is of this I elected to leave a Foley catheter in place.  This completed the procedure. He  was placed in a supine position, anesthesia was reversed, and he was taken to the PACU in stable condition.  All counts were correct at the end of the case.  He will be given antibiotics to cover for his  indwelling catheter and he will have a cystogram at followup on 01/07/13.

## 2012-12-31 NOTE — Transfer of Care (Addendum)
Immediate Anesthesia Transfer of Care Note  Patient: [name]  Procedure(s) Performed: Procedure(s): TRANSURETHRAL RESECTION OF BLADDER TUMOR (TURBT) (N/A) CYSTOSCOPY WITH RETROGRADE PYELOGRAM (Bilateral)  Patient Location: PACU  Anesthesia Type:General  Level of Consciousness: awake and oriented  Airway & Oxygen Therapy: Patient Spontanous Breathing and Patient connected to nasal cannula oxygen  Post-op Assessment: Report given to PACU RN  Post vital signs: Reviewed and stable  Complications: No apparent anesthesia complications, noted IV infiltrated on arrival.

## 2012-12-31 NOTE — Anesthesia Postprocedure Evaluation (Signed)
Anesthesia Post Note  Patient: [name]  Procedure(s) Performed: Procedure(s) (LRB): TRANSURETHRAL RESECTION OF BLADDER TUMOR (TURBT) (N/A) CYSTOSCOPY WITH RETROGRADE PYELOGRAM With CYSTOGRAM AND DEEP BLADDER BIOPSY  (Bilateral)  Anesthesia type: General  Patient location: PACU  Post pain: Pain level controlled  Post assessment: Post-op Vital signs reviewed  Last Vitals:  Filed Vitals:   12/31/12 1430  BP: 117/95  Pulse: 72  Temp:   Resp: 14    Post vital signs: Reviewed  Level of consciousness: sedated  Complications: No apparent anesthesia complications

## 2013-01-01 ENCOUNTER — Encounter (HOSPITAL_BASED_OUTPATIENT_CLINIC_OR_DEPARTMENT_OTHER): Payer: Self-pay | Admitting: Urology

## 2013-01-07 ENCOUNTER — Other Ambulatory Visit: Payer: Self-pay | Admitting: Urology

## 2013-01-15 ENCOUNTER — Emergency Department (HOSPITAL_COMMUNITY)
Admission: EM | Admit: 2013-01-15 | Discharge: 2013-01-15 | Disposition: A | Discharge status: Home / Self Care | Payer: Managed Care, Other (non HMO) | Attending: Emergency Medicine | Admitting: Emergency Medicine

## 2013-01-15 ENCOUNTER — Encounter (HOSPITAL_COMMUNITY): Payer: Self-pay | Admitting: Emergency Medicine

## 2013-01-15 DIAGNOSIS — Z87891 Personal history of nicotine dependence: Secondary | ICD-10-CM | POA: Insufficient documentation

## 2013-01-15 DIAGNOSIS — Z79899 Other long term (current) drug therapy: Secondary | ICD-10-CM | POA: Insufficient documentation

## 2013-01-15 DIAGNOSIS — Z8679 Personal history of other diseases of the circulatory system: Secondary | ICD-10-CM | POA: Insufficient documentation

## 2013-01-15 DIAGNOSIS — Z87448 Personal history of other diseases of urinary system: Secondary | ICD-10-CM | POA: Insufficient documentation

## 2013-01-15 DIAGNOSIS — Z7982 Long term (current) use of aspirin: Secondary | ICD-10-CM | POA: Insufficient documentation

## 2013-01-15 DIAGNOSIS — N433 Hydrocele, unspecified: Secondary | ICD-10-CM | POA: Insufficient documentation

## 2013-01-15 DIAGNOSIS — N509 Disorder of male genital organs, unspecified: Secondary | ICD-10-CM | POA: Insufficient documentation

## 2013-01-15 DIAGNOSIS — R300 Dysuria: Secondary | ICD-10-CM | POA: Insufficient documentation

## 2013-01-15 LAB — URINALYSIS, ROUTINE W REFLEX MICROSCOPIC
Glucose, UA: NEGATIVE mg/dL
Protein, ur: NEGATIVE mg/dL

## 2013-01-15 LAB — URINE MICROSCOPIC-ADD ON

## 2013-01-15 MED ORDER — HYDROCODONE-ACETAMINOPHEN 5-325 MG PO TABS
1.0000 | ORAL_TABLET | Freq: Once | ORAL | Status: AC
  Administered 2013-01-15: 1 via ORAL
  Filled 2013-01-15: qty 1

## 2013-01-15 MED ORDER — HYDROCODONE-ACETAMINOPHEN 5-325 MG PO TABS: 1.0000 | ORAL_TABLET | ORAL | Status: DC | PRN

## 2013-01-15 NOTE — ED Notes (Signed)
Bladder scan 136 ml 

## 2013-01-15 NOTE — ED Notes (Signed)
Per pt, dysuria-history of bladder cancer-feels like he has to go-doesn't feel like his bladder is emptying-states burning with urination-

## 2013-01-15 NOTE — ED Provider Notes (Signed)
History     CSN: [id number]  Arrival date & time 01/15/13  1839   First MD Initiated Contact with Patient 01/15/13 1918      Chief Complaint  Patient presents with  . Dysuria    (Consider location/radiation/quality/duration/timing/severity/associated sxs/prior treatment) Patient is a 57 y.o. male presenting with dysuria. The history is provided by the patient.  Dysuria This is a new problem. Pertinent negatives include no chest pain, no abdominal pain, no headaches and no shortness of breath.   patient states that he began to have some pain at the tip of his penis with urination at around 2 in the morning today. Has previous history of bladder cancer. He states he has had to urinate more frequently. No fevers. No abdominal pain. He states it hurts his penis is not up in his bladder. He states he does have some chronic right testicle pain that is unchanged.  Past Medical History  Diagnosis Date  . RBBB   . S/P aortic valve replacement with bioprosthetic valve 05-10-2009   DR OWENS    CARDIOLOGIST-  DR Clifton James  . History of aortic insufficiency   . Bladder tumor   . Frequency of urination   . Urgency of urination   . Nocturia     Past Surgical History  Procedure Laterality Date  . Hydrocele excision / repair  2009  . Cardiac catheterization  08-10-2008  DR MCALHANY    NO EVIDENCE CAD/ MILD GLOBAL LVSF/ MODERATE AORTIC INSUFFICIENCY/ MILD DILATION OF THE AORTIC ROOT/ EF 45-50%  . Biological bentall aortic root replacement w/ pericardial tissue valve and synthetic aortic graft  05-10-2009  DR OWENS    BICUSPID AV W/ SEVERE AI &  ANEURYSM OF AORTIC ROOT AND PROXIMAL ASCENDING THORACIC AORTA  . Transthoracic echocardiogram  NOV 2012  DR MCALHANY    LVEF 50%/ NORMAL FUNCTIONING BIOPROSTHETIC AORTIC VALVE/ CHRONIC RBBB  . Appendectomy  AGE 43  . Transurethral resection of bladder tumor N/A 12/31/2012    Procedure: TRANSURETHRAL RESECTION OF BLADDER TUMOR (TURBT);  Surgeon: Milford Cage, MD;  Location: [facility];  Service: Urology;  Laterality: N/A;  . Cystoscopy w/ retrogrades Bilateral 12/31/2012    Procedure: CYSTOSCOPY WITH RETROGRADE PYELOGRAM With CYSTOGRAM AND DEEP BLADDER BIOPSY ;  Surgeon: Milford Cage, MD;  Location: [facility];  Service: Urology;  Laterality: Bilateral;    No family history on file.  History  Substance Use Topics  . Smoking status: Former Smoker -- 2.00 packs/day for 25 years    Types: Cigarettes    Quit date: 12/30/2007  . Smokeless tobacco: Never Used  . Alcohol Use: No      Review of Systems  Constitutional: Negative for activity change and appetite change.  HENT: Negative for neck stiffness.   Eyes: Negative for pain.  Respiratory: Negative for chest tightness and shortness of breath.   Cardiovascular: Negative for chest pain and leg swelling.  Gastrointestinal: Negative for nausea, vomiting, abdominal pain and diarrhea.  Genitourinary: Positive for dysuria, penile pain and testicular pain. Negative for flank pain and difficulty urinating.  Musculoskeletal: Negative for back pain.  Skin: Negative for rash.  Neurological: Negative for weakness, numbness and headaches.  Psychiatric/Behavioral: Negative for behavioral problems.    Allergies  Review of patient's allergies indicates no known allergies.  Home Medications   Current Outpatient Rx  Name  Route  Sig  Dispense  Refill  . aspirin EC 81 MG tablet   Oral  Take 81 mg by mouth daily.         [name] atenolol (TENORMIN) 25 MG tablet   Oral   Take 25 mg by mouth every morning.         . hyoscyamine (LEVSIN, ANASPAZ) 0.125 MG tablet   Oral   Take 1 tablet (0.125 mg total) by mouth every 4 (four) hours as needed for cramping (bladder spasms).   40 tablet   4   . ibuprofen (ADVIL,MOTRIN) 200 MG tablet   Oral   Take 200 mg by mouth every 6 (six) hours as needed for pain.         . Multiple Vitamin  (MULTIVITAMIN) tablet   Oral   Take 1 tablet by mouth daily.          . Omega-3 Fatty Acids (FISH OIL PO)   Oral   Take by mouth daily.         . phenazopyridine (PYRIDIUM) 100 MG tablet   Oral   Take 100 mg by mouth 3 (three) times daily as needed for pain.         [name] senna-docusate (SENOKOT S) 8.6-50 MG per tablet   Oral   Take 1 tablet by mouth 2 (two) times daily.   60 tablet   0   . doxycycline (VIBRAMYCIN) 100 MG capsule   Oral   Take 100 mg by mouth daily.         [name] HYDROcodone-acetaminophen (NORCO/VICODIN) 5-325 MG per tablet   Oral   Take 1-2 tablets by mouth every 4 (four) hours as needed for pain.   10 tablet   0   . oxybutynin (DITROPAN) 5 MG tablet   Oral   Take 1 tablet (5 mg total) by mouth every 6 (six) hours as needed (Bladder spasms).   40 tablet   4     BP 122/80  Pulse 63  Temp(Src) 98.9 F (37.2 C) (Oral)  Resp 16  SpO2 98%  Physical Exam  Nursing note and vitals reviewed. Constitutional: He is oriented to person, place, and time. He appears well-developed and well-nourished.  HENT:  Head: Normocephalic and atraumatic.  Eyes: EOM are normal. Pupils are equal, round, and reactive to light.  Neck: Normal range of motion. Neck supple.  Cardiovascular: Normal rate, regular rhythm and normal heart sounds.   No murmur heard. Pulmonary/Chest: Effort normal and breath sounds normal.  Abdominal: Soft. Bowel sounds are normal. He exhibits no distension and no mass. There is no tenderness. There is no rebound and no guarding.  Genitourinary: Penis normal. No penile tenderness.  Hydrocele on right testicle. No erythema to the penis. No penile discharge.  Musculoskeletal: Normal range of motion. He exhibits no edema.  Neurological: He is alert and oriented to person, place, and time. No cranial nerve deficit.  Skin: Skin is warm and dry.  Psychiatric: He has a normal mood and affect.    ED Course  Procedures (including critical care  time)  Labs Reviewed  URINALYSIS, ROUTINE W REFLEX MICROSCOPIC - Abnormal; Notable for the following:    Color, Urine ORANGE (*)    Hgb urine dipstick SMALL (*)    Nitrite POSITIVE (*)    Leukocytes, UA SMALL (*)    All other components within normal limits  URINE CULTURE  URINE MICROSCOPIC-ADD ON   No results found.   1. Dysuria       MDM  Patient with you and dysuria. No clear infection. He has just over 100  cc of post with residual. He has urologist will followup. Urine culture sent. no bacteria urine.        Juliet Rude. Rubin Payor, MD 01/16/13 0002

## 2013-01-15 NOTE — ED Notes (Signed)
Bed:WA02<BR> Expected date:<BR> Expected time:<BR> Means of arrival:<BR> Comments:<BR> Hold for triage 1

## 2013-01-17 LAB — URINE CULTURE
Colony Count: NO GROWTH
Culture: NO GROWTH

## 2013-02-15 ENCOUNTER — Encounter (HOSPITAL_BASED_OUTPATIENT_CLINIC_OR_DEPARTMENT_OTHER): Payer: Self-pay | Admitting: *Deleted

## 2013-02-16 ENCOUNTER — Encounter (HOSPITAL_BASED_OUTPATIENT_CLINIC_OR_DEPARTMENT_OTHER): Payer: Self-pay | Admitting: *Deleted

## 2013-02-16 NOTE — Progress Notes (Signed)
NPO AFTER MN. ARRIVES AT 1100. NEEDS ISTAT. CURRENT EKG IN EPIC AND CHART. WILL TAKE ATENOLOL AM OF SURG W/ SIP OF WATER.

## 2013-02-22 ENCOUNTER — Encounter (HOSPITAL_BASED_OUTPATIENT_CLINIC_OR_DEPARTMENT_OTHER): Payer: Self-pay | Admitting: Anesthesiology

## 2013-02-22 ENCOUNTER — Encounter (HOSPITAL_BASED_OUTPATIENT_CLINIC_OR_DEPARTMENT_OTHER)
Admission: RE | Disposition: A | Discharge status: Home / Self Care | Payer: Self-pay | Source: Ambulatory Visit | Attending: Urology

## 2013-02-22 ENCOUNTER — Ambulatory Visit (HOSPITAL_COMMUNITY): Payer: Managed Care, Other (non HMO)

## 2013-02-22 ENCOUNTER — Ambulatory Visit (HOSPITAL_BASED_OUTPATIENT_CLINIC_OR_DEPARTMENT_OTHER)
Admission: RE | Admit: 2013-02-22 | Discharge: 2013-02-22 | Disposition: A | Discharge status: Home / Self Care | Payer: Managed Care, Other (non HMO) | Source: Ambulatory Visit | Attending: Urology | Admitting: Urology

## 2013-02-22 ENCOUNTER — Ambulatory Visit (HOSPITAL_BASED_OUTPATIENT_CLINIC_OR_DEPARTMENT_OTHER): Payer: Managed Care, Other (non HMO) | Admitting: Anesthesiology

## 2013-02-22 DIAGNOSIS — N1330 Unspecified hydronephrosis: Secondary | ICD-10-CM | POA: Insufficient documentation

## 2013-02-22 DIAGNOSIS — C679 Malignant neoplasm of bladder, unspecified: Secondary | ICD-10-CM | POA: Insufficient documentation

## 2013-02-22 DIAGNOSIS — Z87891 Personal history of nicotine dependence: Secondary | ICD-10-CM | POA: Insufficient documentation

## 2013-02-22 HISTORY — DX: Malignant neoplasm of bladder, unspecified: C67.9

## 2013-02-22 HISTORY — PX: CYSTOSCOPY WITH BIOPSY: SHX5122

## 2013-02-22 LAB — POCT I-STAT 4, (NA,K, GLUC, HGB,HCT): Sodium: 144 mEq/L (ref 135–145)

## 2013-02-22 SURGERY — CYSTOSCOPY, WITH BIOPSY
Anesthesia: General | Site: Bladder | Wound class: Clean Contaminated

## 2013-02-22 MED ORDER — FENTANYL CITRATE 0.05 MG/ML IJ SOLN
INTRAMUSCULAR | Status: DC | PRN
  Administered 2013-02-22: 25 ug via INTRAVENOUS
  Administered 2013-02-22: 50 ug via INTRAVENOUS
  Administered 2013-02-22: 100 ug via INTRAVENOUS
  Administered 2013-02-22: 25 ug via INTRAVENOUS

## 2013-02-22 MED ORDER — EPHEDRINE SULFATE 50 MG/ML IJ SOLN
INTRAMUSCULAR | Status: DC | PRN
  Administered 2013-02-22: 5 mg via INTRAVENOUS
  Administered 2013-02-22: 10 mg via INTRAVENOUS

## 2013-02-22 MED ORDER — PROPOFOL 10 MG/ML IV BOLUS
INTRAVENOUS | Status: DC | PRN
  Administered 2013-02-22: 250 mg via INTRAVENOUS
  Administered 2013-02-22: 50 mg via INTRAVENOUS

## 2013-02-22 MED ORDER — DIATRIZOATE MEGLUMINE 30 % UR SOLN
URETHRAL | Status: DC | PRN
  Administered 2013-02-22: 300 mL via URETHRAL

## 2013-02-22 MED ORDER — ONDANSETRON HCL 4 MG/2ML IJ SOLN
INTRAMUSCULAR | Status: DC | PRN
  Administered 2013-02-22: 4 mg via INTRAVENOUS

## 2013-02-22 MED ORDER — MIDAZOLAM HCL 5 MG/5ML IJ SOLN
INTRAMUSCULAR | Status: DC | PRN
  Administered 2013-02-22: 2 mg via INTRAVENOUS

## 2013-02-22 MED ORDER — PHENAZOPYRIDINE HCL 100 MG PO TABS
100.0000 mg | ORAL_TABLET | Freq: Three times a day (TID) | ORAL | Status: DC
  Filled 2013-02-22: qty 1

## 2013-02-22 MED ORDER — NEOSTIGMINE METHYLSULFATE 1 MG/ML IJ SOLN
INTRAMUSCULAR | Status: DC | PRN
  Administered 2013-02-22 (×2): 2 mg via INTRAVENOUS

## 2013-02-22 MED ORDER — SENNOSIDES-DOCUSATE SODIUM 8.6-50 MG PO TABS: 1.0000 | ORAL_TABLET | Freq: Two times a day (BID) | ORAL | Status: DC

## 2013-02-22 MED ORDER — OXYCODONE-ACETAMINOPHEN 5-325 MG PO TABS: 1.0000 | ORAL_TABLET | ORAL | Status: DC | PRN

## 2013-02-22 MED ORDER — PHENAZOPYRIDINE HCL 100 MG PO TABS
100.0000 mg | ORAL_TABLET | Freq: Three times a day (TID) | ORAL | Status: DC
  Administered 2013-02-22: 100 mg via ORAL
  Filled 2013-02-22: qty 1

## 2013-02-22 MED ORDER — FENTANYL CITRATE 0.05 MG/ML IJ SOLN
25.0000 ug | INTRAMUSCULAR | Status: DC | PRN
  Administered 2013-02-22 (×2): 25 ug via INTRAVENOUS
  Filled 2013-02-22: qty 1

## 2013-02-22 MED ORDER — LIDOCAINE HCL (CARDIAC) 20 MG/ML IV SOLN
INTRAVENOUS | Status: DC | PRN
  Administered 2013-02-22: 80 mg via INTRAVENOUS

## 2013-02-22 MED ORDER — PHENAZOPYRIDINE HCL 200 MG PO TABS
200.0000 mg | ORAL_TABLET | Freq: Three times a day (TID) | ORAL | Status: DC
  Filled 2013-02-22: qty 1

## 2013-02-22 MED ORDER — ROCURONIUM BROMIDE 100 MG/10ML IV SOLN
INTRAVENOUS | Status: DC | PRN
  Administered 2013-02-22: 30 mg via INTRAVENOUS
  Administered 2013-02-22: 10 mg via INTRAVENOUS

## 2013-02-22 MED ORDER — LACTATED RINGERS IV SOLN
INTRAVENOUS | Status: DC
  Filled 2013-02-22: qty 1000

## 2013-02-22 MED ORDER — CEFAZOLIN SODIUM-DEXTROSE 2-3 GM-% IV SOLR
2.0000 g | INTRAVENOUS | Status: AC
  Administered 2013-02-22: 2 g via INTRAVENOUS
  Filled 2013-02-22: qty 50

## 2013-02-22 MED ORDER — OXYCODONE-ACETAMINOPHEN 5-325 MG PO TABS
1.0000 | ORAL_TABLET | ORAL | Status: DC | PRN
  Administered 2013-02-22: 1 via ORAL
  Filled 2013-02-22: qty 2

## 2013-02-22 MED ORDER — KETOROLAC TROMETHAMINE 30 MG/ML IJ SOLN
INTRAMUSCULAR | Status: DC | PRN
  Administered 2013-02-22: 30 mg via INTRAVENOUS

## 2013-02-22 MED ORDER — SUCCINYLCHOLINE CHLORIDE 20 MG/ML IJ SOLN
INTRAMUSCULAR | Status: DC | PRN
  Administered 2013-02-22: 100 mg via INTRAVENOUS

## 2013-02-22 MED ORDER — PHENAZOPYRIDINE HCL 100 MG PO TABS: 200.0000 mg | ORAL_TABLET | Freq: Three times a day (TID) | ORAL | Status: DC | PRN

## 2013-02-22 MED ORDER — DEXAMETHASONE SODIUM PHOSPHATE 4 MG/ML IJ SOLN
INTRAMUSCULAR | Status: DC | PRN
  Administered 2013-02-22: 10 mg via INTRAVENOUS

## 2013-02-22 MED ORDER — BELLADONNA ALKALOIDS-OPIUM 16.2-60 MG RE SUPP
RECTAL | Status: DC | PRN
  Administered 2013-02-22: 1 via RECTAL

## 2013-02-22 MED ORDER — GLYCOPYRROLATE 0.2 MG/ML IJ SOLN
INTRAMUSCULAR | Status: DC | PRN
  Administered 2013-02-22: .5 mg via INTRAVENOUS
  Administered 2013-02-22: 0.1 mg via INTRAVENOUS

## 2013-02-22 MED ORDER — CEPHALEXIN 500 MG PO CAPS: 500.0000 mg | ORAL_CAPSULE | Freq: Three times a day (TID) | ORAL | Status: DC

## 2013-02-22 MED ORDER — SODIUM CHLORIDE 0.9 % IR SOLN
Status: DC | PRN
  Administered 2013-02-22: 9000 mL

## 2013-02-22 MED ORDER — LACTATED RINGERS IV SOLN
INTRAVENOUS | Status: DC
  Administered 2013-02-22 (×4): via INTRAVENOUS
  Filled 2013-02-22: qty 1000

## 2013-02-22 SURGICAL SUPPLY — 37 items
BAG DRAIN URO-CYSTO SKYTR STRL (DRAIN) ×2 IMPLANT
BAG URINE DRAINAGE (UROLOGICAL SUPPLIES) IMPLANT
BAG URINE LEG 19OZ MD ST LTX (BAG) ×2 IMPLANT
CANISTER SUCT LVC 12 LTR MEDI- (MISCELLANEOUS) ×2 IMPLANT
CATH FOLEY 2WAY SLVR  5CC 22FR (CATHETERS) ×1
CATH FOLEY 2WAY SLVR 5CC 22FR (CATHETERS) ×1 IMPLANT
CATH FOLEY 3WAY 30CC 24FR (CATHETERS) ×1
CATH HEMA 3WAY 30CC 24FR COUDE (CATHETERS) IMPLANT
CATH HEMA 3WAY 30CC 24FR RND (CATHETERS) IMPLANT
CATH URTH STD 24FR FL 3W 2 (CATHETERS) ×1 IMPLANT
CLOTH BEACON ORANGE TIMEOUT ST (SAFETY) ×2 IMPLANT
DRAPE CAMERA CLOSED 9X96 (DRAPES) ×2 IMPLANT
ELECT BUTTON HF 24-28F 2 30DE (ELECTRODE) IMPLANT
ELECT LOOP MED HF 24F 12D CBL (CLIP) ×2 IMPLANT
ELECT REM PT RETURN 9FT ADLT (ELECTROSURGICAL) ×2
ELECT RESECT VAPORIZE 12D CBL (ELECTRODE) ×2 IMPLANT
ELECTRODE REM PT RTRN 9FT ADLT (ELECTROSURGICAL) ×1 IMPLANT
EVACUATOR MICROVAS BLADDER (UROLOGICAL SUPPLIES) IMPLANT
GLOVE BIO SURGEON STRL SZ7 (GLOVE) ×2 IMPLANT
GLOVE BIOGEL M 6.5 STRL (GLOVE) ×2 IMPLANT
GLOVE INDICATOR 7.5 STRL GRN (GLOVE) ×2 IMPLANT
GOWN PREVENTION PLUS XLARGE (GOWN DISPOSABLE) ×2 IMPLANT
GOWN STRL NON-REIN LRG LVL3 (GOWN DISPOSABLE) ×2 IMPLANT
GOWN STRL REIN XL XLG (GOWN DISPOSABLE) ×2 IMPLANT
HOLDER FOLEY CATH W/STRAP (MISCELLANEOUS) ×4 IMPLANT
IV NS IRRIG 3000ML ARTHROMATIC (IV SOLUTION) ×4 IMPLANT
KIT ASPIRATION TUBING (SET/KITS/TRAYS/PACK) ×2 IMPLANT
NEEDLE HYPO 22GX1.5 SAFETY (NEEDLE) IMPLANT
NS IRRIG 500ML POUR BTL (IV SOLUTION) ×2 IMPLANT
PACK CYSTOSCOPY (CUSTOM PROCEDURE TRAY) ×2 IMPLANT
PLUG CATH AND CAP STER (CATHETERS) IMPLANT
SET ASPIRATION TUBING (TUBING) IMPLANT
STENT CONTOUR 6FRX28X.038 (STENTS) ×2 IMPLANT
SYR 30ML LL (SYRINGE) IMPLANT
SYR BULB IRRIGATION 50ML (SYRINGE) ×2 IMPLANT
SYRINGE IRR TOOMEY STRL 70CC (SYRINGE) ×2 IMPLANT
WATER STERILE IRR 3000ML UROMA (IV SOLUTION) ×2 IMPLANT

## 2013-02-22 NOTE — Anesthesia Preprocedure Evaluation (Addendum)
Anesthesia Evaluation  Patient identified by MRN, date of birth, ID band Patient awake    Reviewed: Allergy & Precautions, H&P , NPO status , Patient's Chart, lab work & pertinent test results, reviewed documented beta blocker date and time   Airway Mallampati: II TM Distance: >3 FB Neck ROM: full    Dental no notable dental hx. (+) Teeth Intact and Dental Advisory Given   Pulmonary neg pulmonary ROS, former smoker,  50 py smoking breath sounds clear to auscultation  Pulmonary exam normal       Cardiovascular Exercise Tolerance: Good negative cardio ROS  Rhythm:regular Rate:Normal  AVR. RBBB   Neuro/Psych negative neurological ROS  negative psych ROS   GI/Hepatic negative GI ROS, Neg liver ROS,   Endo/Other  negative endocrine ROS  Renal/GU negative Renal ROS  negative genitourinary   Musculoskeletal   Abdominal   Peds  Hematology negative hematology ROS (+)   Anesthesia Other Findings   Reproductive/Obstetrics negative OB ROS                          Anesthesia Physical Anesthesia Plan  ASA: II  Anesthesia Plan: General   Post-op Pain Management:    Induction: Intravenous  Airway Management Planned: Oral ETT  Additional Equipment:   Intra-op Plan:   Post-operative Plan: Extubation in OR  Informed Consent: I have reviewed the patients History and Physical, chart, labs and discussed the procedure including the risks, benefits and alternatives for the proposed anesthesia with the patient or authorized representative who has indicated his/her understanding and acceptance.   Dental Advisory Given  Plan Discussed with: CRNA and Surgeon  Anesthesia Plan Comments:        Anesthesia Quick Evaluation

## 2013-02-22 NOTE — Anesthesia Postprocedure Evaluation (Signed)
  Anesthesia Post-op Note  Patient: [name]  Procedure(s) Performed: Procedure(s) (LRB): CYSTOSCOPY WITH BIOPSY (N/A) TRANSURETHRAL RESECTION OF BLADDER TUMOR WITH GYRUS (TURBT-GYRUS) (N/A)  Patient Location: PACU  Anesthesia Type: General  Level of Consciousness: awake and alert   Airway and Oxygen Therapy: Patient Spontanous Breathing  Post-op Pain: mild  Post-op Assessment: Post-op Vital signs reviewed, Patient's Cardiovascular Status Stable, Respiratory Function Stable, Patent Airway and No signs of Nausea or vomiting  Last Vitals:  Filed Vitals:   02/22/13 1132  BP: 113/81  Pulse: 63  Temp: 37 C  Resp: 20    Post-op Vital Signs: stable   Complications: No apparent anesthesia complications

## 2013-02-22 NOTE — Anesthesia Procedure Notes (Signed)
Procedure Name: Intubation Date/Time: 02/22/2013 12:39 PM Performed by: Norva Pavlov Pre-anesthesia Checklist: Patient identified, Emergency Drugs available, Suction available and Patient being monitored Patient Re-evaluated:Patient Re-evaluated prior to inductionOxygen Delivery Method: Circle System Utilized Preoxygenation: Pre-oxygenation with 100% oxygen Intubation Type: IV induction Ventilation: Mask ventilation without difficulty Laryngoscope Size: Mac and 4 Tube type: Oral Tube size: 8.0 mm Number of attempts: 1 Airway Equipment and Method: stylet and LTA kit utilized Placement Confirmation: ETT inserted through vocal cords under direct vision,  positive ETCO2 and breath sounds checked- equal and bilateral Secured at: 22 cm Tube secured with: Tape Dental Injury: Teeth and Oropharynx as per pre-operative assessment

## 2013-02-22 NOTE — Transfer of Care (Signed)
Immediate Anesthesia Transfer of Care Note  Patient: [name]  Procedure(s) Performed: Procedure(s) (LRB): CYSTOSCOPY WITH BIOPSY (N/A) TRANSURETHRAL RESECTION OF BLADDER TUMOR WITH GYRUS (TURBT-GYRUS) (N/A)  Patient Location: PACU  Anesthesia Type: General  Level of Consciousness: awake, alert  and oriented  Airway & Oxygen Therapy: Patient Spontanous Breathing and Patient connected to face mask oxygen  Post-op Assessment: Report given to PACU RN and Post -op Vital signs reviewed and stable  Post vital signs: Reviewed and stable  Complications: No apparent anesthesia complications

## 2013-02-22 NOTE — H&P (Signed)
Urology History and Physical Exam  CC: Bladder cancer  HPI:  57 year old male presents today for bladder cancer. This was discovered during workup for hematuria. He had a transurethral resection of bladder tumor in May 2014. This returned urothelial carcinoma high grade which was not muscle invasive. This was associated with postoperative dysuria. He denies any recent gross hematuria. He returns today for cystoscopy and bladder biopsy for staging purposes for his high-grade urothelial carcinoma of the bladder. He was cleared by his cardiologist, Dr. Clifton James, prior to his first surgery. Nothing makes his bladder cancer better or worse. UA from 02/08/13 was negative for signs of infection.  PMH: Past Medical History  Diagnosis Date  . RBBB   . S/P aortic valve replacement with bioprosthetic valve 05-10-2009   BY DR Barry Dienes    CARDIOLOGIST-  DR Clifton James  . History of aortic insufficiency   . Frequency of urination   . Nocturia   . Bladder cancer     PSH: Past Surgical History  Procedure Laterality Date  . Hydrocele excision / repair  2009  . Cardiac catheterization  08-10-2008  DR MCALHANY    NO EVIDENCE CAD/ MILD GLOBAL LVSF/ MODERATE AORTIC INSUFFICIENCY/ MILD DILATION OF THE AORTIC ROOT/ EF 45-50%  . Biological bentall aortic root replacement w/ pericardial tissue valve and synthetic aortic graft  05-10-2009  DR OWENS    BICUSPID AV W/ SEVERE AI &  ANEURYSM OF AORTIC ROOT AND PROXIMAL ASCENDING THORACIC AORTA  . Transthoracic echocardiogram  NOV 2012  DR MCALHANY    LVEF 50%/ NORMAL FUNCTIONING BIOPROSTHETIC AORTIC VALVE/ CHRONIC RBBB  . Appendectomy  AGE 35  . Transurethral resection of bladder tumor N/A 12/31/2012    Procedure: TRANSURETHRAL RESECTION OF BLADDER TUMOR (TURBT);  Surgeon: Milford Cage, MD;  Location: [facility];  Service: Urology;  Laterality: N/A;  . Cystoscopy w/ retrogrades Bilateral 12/31/2012    Procedure: CYSTOSCOPY WITH RETROGRADE  PYELOGRAM With CYSTOGRAM AND DEEP BLADDER BIOPSY ;  Surgeon: Milford Cage, MD;  Location: [facility];  Service: Urology;  Laterality: Bilateral;    Allergies: No Known Allergies  Medications: No prescriptions prior to admission     Social History: History   Social History  . Marital Status: Single    Spouse Name: N/A    Number of Children: N/A  . Years of Education: N/A   Occupational History  . Not on file.   Social History Main Topics  . Smoking status: Former Smoker -- 2.00 packs/day for 25 years    Types: Cigarettes    Quit date: 12/30/2007  . Smokeless tobacco: Never Used  . Alcohol Use: No  . Drug Use: No  . Sexually Active: Not on file   Other Topics Concern  . Not on file   Social History Narrative   Tobacco use 1 1/2 ppd for 35 years- stopped 2010   No alcohol    No illicit drugs   Single    2 children    Family History: History reviewed. No pertinent family history.  Review of Systems: Positive: Right groin pain. Negative: Fever, chest pain, or SOB.  A further 10 point review of systems was negative except what is listed in the HPI.  Physical Exam: Filed Vitals:   02/22/13 1132  BP: 113/81  Pulse: 63  Temp: 98.6 F (37 C)  Resp: 20    General: No acute distress.  Awake. Head:  Normocephalic.  Atraumatic. ENT:  EOMI.  Mucous membranes moist  Neck:  Supple.  No lymphadenopathy. CV:  S1 present. S2 present. Regular rate. Pulmonary: Equal effort bilaterally.  Clear to auscultation bilaterally. Abdomen: Soft.  Non- tender to palpation. Skin:  Normal turgor.  No visible rash. Extremity: No gross deformity of bilateral upper extremities.  No gross deformity of    bilateral lower extremities. Neurologic: Alert. Appropriate mood.    Studies:  No results found for this basename: HGB, WBC, PLT,  in the last 72 hours  No results found for this basename: NA, K, CL, CO2, BUN, CREATININE, CALCIUM, MAGNESIUM, GFRNONAA,  GFRAA,  in the last 72 hours   No results found for this basename: PT, INR, APTT,  in the last 72 hours   No components found with this basename: ABG,     Assessment:  Bladder cancer.  Plan: To OR for cystoscopy and bladder biopsy.

## 2013-02-22 NOTE — Op Note (Signed)
Urology Operative Report  Date of Procedure: 02/22/13  Surgeon: Natalia Leatherwood, MD Assistant:  None  Preoperative Diagnosis: Bladder cancer Postoperative Diagnosis:  Same  Procedure(s): Cystoscopy Transurethral resection of bladder tumor (medium, >2 cm) Bladder biopsy Right retrograde pyelogram Right ureter stent placement Cystogram Foley catheter placement.  Estimated blood loss: Minimal  Specimen: Bladder biopsy. Transurethral resection of bladder tumor.   Drains: Foley (20Fr)  Complications: None  Findings: Large area on left lateral bladder wall of previously located bladder tumor larger than 2 cm less than 5 cm. Small punctate bladder tumor on right lateral bladder wall. Right intramural ureter unroofed. Mild right hydronephrosis on right retrograde Pyelogram. Negative extravasation of contrast with cystogram.  History of present illness: Patient presents with recent history of high-grade bladder tumor. He presents today for second resection of bladder tumor for staging purposes.   Procedure in detail: After informed consent was obtained, the patient was taken to the operating room. They were placed in the supine position. SCDs were turned on and in place. IV antibiotics were infused, and general anesthesia was induced. A timeout was performed in which the correct patient, surgical site, and procedure were identified and agreed upon by the team. A belladonna and opium suppository was placed into the rectum.  The patient was placed in a dorsolithotomy position, making sure to pad all pertinent neurovascular pressure points. The genitals were prepped and draped in the usual sterile fashion.  A rigid cystoscope was advanced through the urethra into the bladder. It was drained and then fully evaluated in a systematic fashion with a 30 and 70 lens to visualize the entire surface of the bladder. There was noted to be the previously resected area of left bladder tumor which  was larger than 2 cm and less than 5 cm with fibrinous exudate over it. There was a new small punctate bladder tumor on the right lateral bladder wall. There was also a divot overlying the right intramural ureter were previously located lesion had been located.  Cold cup biopsy forceps were used to biopsy the right lateral bladder wall tumor. This was sent for one specimen.  The visual obturator of the gyrus scope was placed through the urethra after calibration with [employer] sounds. The bladder biopsy site was fulgurated with the gyrus device.  Attention was turned to the left lateral bladder tumor. The fibula section date was removed with resection and removed from the bladder. I then completed resection of the remaining tissue until I could visibly see muscle fibers. I felt that I resected muscle fibers with the specimen. These was in for a second specimen. I then fulgurated the tumor site. He was good hemostasis. There was no gross bladder perforation noted.  Attention was turned to the right ureter orifice. It was cannulated with a 5 [place] ureter catheter and I injected contrast to obtain a retrograde pyelogram. The contrast extravasated into the bladder through the unroofed segment of intramural ureter. I then placed an angle-tipped Glidewire through the natural ureter orifice across the defect and into the ureter up into the right renal pelvis. Over this I placed a 5 [place] ureter catheter into the right renal pelvis and shot a retrograde pyelogram by injecting contrast. There was noted to be some mild dilation of the renal pelvis and ureter. I could not tell whether this was due to obstruction versus reflux from having his bladder fully distended during the resection of the bladder tumor. The ureter catheter went up easily and did not  appear to have any obstruction. There was noted to be drainage of contrast around the sensor wire after this was placed through the 5 French catheter and the catheter  was removed. My options at that point were to continue to unroofed the right intramural ureter which is leave it as it was in place a right ureter stent. I elected to place a stent. A 6 x 28 double-J stent without tethering strings was placed over the sensor-tip wire with a good curl noted in the right renal pelvis and a curl in the bladder.  I then drained the bladder and placed a 22 French Foley catheter with 7 cc of sterile water in the balloon. I took precontrast an image and then filled the bladder via gravity with over 400 cc of Cystografin. Another film was obtained which showed no extravasation. I then drained the bladder and took a final film showed no residual contrast indicating no extravasation. Due to to the thin wall of his bladder from the resected the resection I felt it would be more appropriately the Foley catheter in place.  Anesthesia was reversed, he is placed in a supine position, and he was taken to the PACU in stable condition.  All counts were correct at the end of the case.  He'll be given Keflex to start the day before his Foley catheter was removed.

## 2013-02-23 ENCOUNTER — Encounter (HOSPITAL_BASED_OUTPATIENT_CLINIC_OR_DEPARTMENT_OTHER): Payer: Self-pay | Admitting: Urology

## 2013-07-07 ENCOUNTER — Other Ambulatory Visit: Payer: Self-pay | Admitting: Urology

## 2013-07-23 ENCOUNTER — Other Ambulatory Visit (HOSPITAL_COMMUNITY): Payer: Managed Care, Other (non HMO)

## 2013-07-23 ENCOUNTER — Telehealth: Payer: Self-pay | Admitting: *Deleted

## 2013-07-23 ENCOUNTER — Encounter (HOSPITAL_BASED_OUTPATIENT_CLINIC_OR_DEPARTMENT_OTHER): Payer: Self-pay | Admitting: *Deleted

## 2013-07-23 ENCOUNTER — Encounter: Payer: Self-pay | Admitting: Cardiology

## 2013-07-23 ENCOUNTER — Ambulatory Visit (HOSPITAL_COMMUNITY): Payer: Managed Care, Other (non HMO) | Attending: Cardiology | Admitting: Radiology

## 2013-07-23 DIAGNOSIS — I4510 Unspecified right bundle-branch block: Secondary | ICD-10-CM | POA: Insufficient documentation

## 2013-07-23 DIAGNOSIS — Z8774 Personal history of (corrected) congenital malformations of heart and circulatory system: Secondary | ICD-10-CM | POA: Insufficient documentation

## 2013-07-23 DIAGNOSIS — Z954 Presence of other heart-valve replacement: Secondary | ICD-10-CM | POA: Insufficient documentation

## 2013-07-23 DIAGNOSIS — I359 Nonrheumatic aortic valve disorder, unspecified: Secondary | ICD-10-CM

## 2013-07-23 NOTE — Progress Notes (Signed)
Echocardiogram performed.  

## 2013-07-23 NOTE — Telephone Encounter (Signed)
Follow up ° ° ° ° °Pt returning your call please call him back. °

## 2013-07-23 NOTE — Telephone Encounter (Signed)
Pt here for echo today and left message he would like call to discuss questions regarding his cancer. I placed call to pt and left message to call back

## 2013-07-23 NOTE — Telephone Encounter (Signed)
Left message to call back  

## 2013-07-23 NOTE — Progress Notes (Signed)
To [facility] at 1100-Istat on arrival-Ekg,Echo report with chart. Instructed Npo after Mn-will take atenolol with small  amt water that am.

## 2013-07-26 NOTE — Telephone Encounter (Signed)
Generic flomax should be fine. Do I need to call him Dennie Bible? I can call him later if he wishes to speak with me. Thayer Ohm

## 2013-07-26 NOTE — Telephone Encounter (Signed)
Spoke with pt who thought he was going to see Dr. Clifton James when here for recent echo. He has been undergoing treatment for bladder cancer and states he may want to get advice from Dr. Clifton James regarding this cancer after he has biopsy. No questions at this time regarding cancer.   He does want to know if OK with Dr. Clifton James for him to take generic Flomax. This was prescribed by his urologist for enlarged prostate.   Pt is also due for one year follow up with Dr. Clifton James. He does not want to schedule at this time but will call us back in a couple weeks to schedule.  I reviewed recent echo results with pt. Will send to Dr. Clifton James regarding flomax question.

## 2013-07-28 NOTE — Telephone Encounter (Signed)
Dr. Clifton James has tried to call pt but was unable to reach him. He will try again

## 2013-08-02 ENCOUNTER — Encounter (HOSPITAL_BASED_OUTPATIENT_CLINIC_OR_DEPARTMENT_OTHER): Payer: Self-pay | Admitting: Anesthesiology

## 2013-08-02 ENCOUNTER — Encounter (HOSPITAL_BASED_OUTPATIENT_CLINIC_OR_DEPARTMENT_OTHER): Payer: Managed Care, Other (non HMO) | Admitting: Anesthesiology

## 2013-08-02 ENCOUNTER — Ambulatory Visit (HOSPITAL_BASED_OUTPATIENT_CLINIC_OR_DEPARTMENT_OTHER)
Admission: RE | Admit: 2013-08-02 | Discharge: 2013-08-02 | Disposition: A | Discharge status: Home / Self Care | Payer: Managed Care, Other (non HMO) | Source: Ambulatory Visit | Attending: Urology | Admitting: Urology

## 2013-08-02 ENCOUNTER — Ambulatory Visit (HOSPITAL_BASED_OUTPATIENT_CLINIC_OR_DEPARTMENT_OTHER): Payer: Managed Care, Other (non HMO) | Admitting: Anesthesiology

## 2013-08-02 ENCOUNTER — Encounter (HOSPITAL_BASED_OUTPATIENT_CLINIC_OR_DEPARTMENT_OTHER)
Admission: RE | Disposition: A | Discharge status: Home / Self Care | Payer: Self-pay | Source: Ambulatory Visit | Attending: Urology

## 2013-08-02 DIAGNOSIS — Z954 Presence of other heart-valve replacement: Secondary | ICD-10-CM | POA: Insufficient documentation

## 2013-08-02 DIAGNOSIS — C679 Malignant neoplasm of bladder, unspecified: Secondary | ICD-10-CM

## 2013-08-02 DIAGNOSIS — N3090 Cystitis, unspecified without hematuria: Secondary | ICD-10-CM | POA: Insufficient documentation

## 2013-08-02 DIAGNOSIS — Z87891 Personal history of nicotine dependence: Secondary | ICD-10-CM | POA: Insufficient documentation

## 2013-08-02 HISTORY — PX: CYSTOSCOPY WITH BIOPSY: SHX5122

## 2013-08-02 LAB — POCT I-STAT 4, (NA,K, GLUC, HGB,HCT)
Glucose, Bld: 108 mg/dL — ABNORMAL HIGH (ref 70–99)
HCT: 41 % (ref 39.0–52.0)
Hemoglobin: 13.9 g/dL (ref 13.0–17.0)
Sodium: 144 mEq/L (ref 135–145)

## 2013-08-02 SURGERY — CYSTOSCOPY, WITH BIOPSY
Anesthesia: General | Site: Bladder

## 2013-08-02 MED ORDER — LIDOCAINE HCL 2 % EX GEL
CUTANEOUS | Status: DC | PRN
  Administered 2013-08-02: 1 via URETHRAL

## 2013-08-02 MED ORDER — BELLADONNA ALKALOIDS-OPIUM 16.2-60 MG RE SUPP
RECTAL | Status: DC | PRN
  Administered 2013-08-02: 1 via RECTAL

## 2013-08-02 MED ORDER — BELLADONNA ALKALOIDS-OPIUM 16.2-60 MG RE SUPP
RECTAL | Status: AC
  Filled 2013-08-02: qty 1

## 2013-08-02 MED ORDER — STERILE WATER FOR IRRIGATION IR SOLN
Status: DC | PRN
  Administered 2013-08-02: 3000 mL via INTRAVESICAL

## 2013-08-02 MED ORDER — ONDANSETRON HCL 4 MG/2ML IJ SOLN
INTRAMUSCULAR | Status: DC | PRN
  Administered 2013-08-02: 4 mg via INTRAVENOUS

## 2013-08-02 MED ORDER — OXYCODONE-ACETAMINOPHEN 5-325 MG PO TABS: 1.0000 | ORAL_TABLET | ORAL | Status: DC | PRN

## 2013-08-02 MED ORDER — MIDAZOLAM HCL 5 MG/5ML IJ SOLN
INTRAMUSCULAR | Status: DC | PRN
  Administered 2013-08-02: 2 mg via INTRAVENOUS

## 2013-08-02 MED ORDER — CEFAZOLIN SODIUM-DEXTROSE 2-3 GM-% IV SOLR
2.0000 g | INTRAVENOUS | Status: AC
  Administered 2013-08-02: 2 g via INTRAVENOUS
  Filled 2013-08-02: qty 50

## 2013-08-02 MED ORDER — DEXAMETHASONE SODIUM PHOSPHATE 4 MG/ML IJ SOLN
INTRAMUSCULAR | Status: DC | PRN
  Administered 2013-08-02: 10 mg via INTRAVENOUS

## 2013-08-02 MED ORDER — OXYBUTYNIN CHLORIDE 5 MG PO TABS: 5.0000 mg | ORAL_TABLET | Freq: Three times a day (TID) | ORAL | Status: DC | PRN

## 2013-08-02 MED ORDER — LIDOCAINE HCL (CARDIAC) 20 MG/ML IV SOLN
INTRAVENOUS | Status: DC | PRN
  Administered 2013-08-02: 80 mg via INTRAVENOUS

## 2013-08-02 MED ORDER — LACTATED RINGERS IV SOLN
INTRAVENOUS | Status: DC
  Administered 2013-08-02: 12:00:00 via INTRAVENOUS
  Filled 2013-08-02: qty 1000

## 2013-08-02 MED ORDER — FENTANYL CITRATE 0.05 MG/ML IJ SOLN
INTRAMUSCULAR | Status: DC | PRN
  Administered 2013-08-02: 50 ug via INTRAVENOUS

## 2013-08-02 MED ORDER — FENTANYL CITRATE 0.05 MG/ML IJ SOLN
INTRAMUSCULAR | Status: AC
  Filled 2013-08-02: qty 2

## 2013-08-02 MED ORDER — PROPOFOL 10 MG/ML IV BOLUS
INTRAVENOUS | Status: DC | PRN
  Administered 2013-08-02: 180 mg via INTRAVENOUS

## 2013-08-02 MED ORDER — CIPROFLOXACIN HCL 500 MG PO TABS: 500.0000 mg | ORAL_TABLET | Freq: Two times a day (BID) | ORAL | Status: DC

## 2013-08-02 MED ORDER — SENNOSIDES-DOCUSATE SODIUM 8.6-50 MG PO TABS: 1.0000 | ORAL_TABLET | Freq: Two times a day (BID) | ORAL | Status: DC

## 2013-08-02 MED ORDER — MIDAZOLAM HCL 2 MG/2ML IJ SOLN
INTRAMUSCULAR | Status: AC
  Filled 2013-08-02: qty 2

## 2013-08-02 MED ORDER — PHENAZOPYRIDINE HCL 200 MG PO TABS: 200.0000 mg | ORAL_TABLET | Freq: Three times a day (TID) | ORAL | Status: DC | PRN

## 2013-08-02 MED ORDER — HYOSCYAMINE SULFATE 0.125 MG PO TABS: 0.1250 mg | ORAL_TABLET | ORAL | Status: DC | PRN

## 2013-08-02 SURGICAL SUPPLY — 17 items
BAG DRAIN URO-CYSTO SKYTR STRL (DRAIN) ×2 IMPLANT
CANISTER SUCT LVC 12 LTR MEDI- (MISCELLANEOUS) ×2 IMPLANT
CLOTH BEACON ORANGE TIMEOUT ST (SAFETY) ×2 IMPLANT
DRAPE CAMERA CLOSED 9X96 (DRAPES) ×2 IMPLANT
ELECT REM PT RETURN 9FT ADLT (ELECTROSURGICAL) ×2
ELECTRODE REM PT RTRN 9FT ADLT (ELECTROSURGICAL) ×1 IMPLANT
GLOVE BIO SURGEON STRL SZ 6.5 (GLOVE) ×2 IMPLANT
GLOVE BIO SURGEON STRL SZ7 (GLOVE) ×2 IMPLANT
GLOVE INDICATOR 7.0 STRL GRN (GLOVE) ×4 IMPLANT
GLOVE INDICATOR 7.5 STRL GRN (GLOVE) IMPLANT
GOWN STRL NON-REIN LRG LVL3 (GOWN DISPOSABLE) ×2 IMPLANT
GOWN STRL REIN XL XLG (GOWN DISPOSABLE) ×2 IMPLANT
GUIDEWIRE STR DUAL SENSOR (WIRE) ×2 IMPLANT
NEEDLE HYPO 22GX1.5 SAFETY (NEEDLE) IMPLANT
NS IRRIG 500ML POUR BTL (IV SOLUTION) IMPLANT
PACK CYSTOSCOPY (CUSTOM PROCEDURE TRAY) ×2 IMPLANT
WATER STERILE IRR 3000ML UROMA (IV SOLUTION) ×4 IMPLANT

## 2013-08-02 NOTE — Anesthesia Preprocedure Evaluation (Signed)
Anesthesia Evaluation  Patient identified by MRN, date of birth, ID band Patient awake    Reviewed: Allergy & Precautions, H&P , NPO status , Patient's Chart, lab work & pertinent test results  Airway Mallampati: II TM Distance: >3 FB Neck ROM: Full    Dental no notable dental hx.    Pulmonary former smoker,  breath sounds clear to auscultation  Pulmonary exam normal       Cardiovascular Exercise Tolerance: Good + dysrhythmias Rhythm:Regular Rate:Normal  S/P AVR 2010  ECG: RBBB   Neuro/Psych negative neurological ROS  negative psych ROS   GI/Hepatic negative GI ROS, Neg liver ROS,   Endo/Other  negative endocrine ROS  Renal/GU negative Renal ROS  negative genitourinary   Musculoskeletal negative musculoskeletal ROS (+)   Abdominal   Peds negative pediatric ROS (+)  Hematology negative hematology ROS (+)   Anesthesia Other Findings   Reproductive/Obstetrics negative OB ROS                           Anesthesia Physical Anesthesia Plan  ASA: III  Anesthesia Plan: General   Post-op Pain Management:    Induction: Intravenous  Airway Management Planned: LMA  Additional Equipment:   Intra-op Plan:   Post-operative Plan: Extubation in OR  Informed Consent: I have reviewed the patients History and Physical, chart, labs and discussed the procedure including the risks, benefits and alternatives for the proposed anesthesia with the patient or authorized representative who has indicated his/her understanding and acceptance.   Dental advisory given  Plan Discussed with: CRNA  Anesthesia Plan Comments:         Anesthesia Quick Evaluation

## 2013-08-02 NOTE — Anesthesia Procedure Notes (Signed)
Procedure Name: LMA Insertion Performed by: Dilon Lank T Pre-anesthesia Checklist: Patient identified, Emergency Drugs available, Suction available and Patient being monitored Patient Re-evaluated:Patient Re-evaluated prior to inductionOxygen Delivery Method: Circle System Utilized Preoxygenation: Pre-oxygenation with 100% oxygen Intubation Type: IV induction Ventilation: Mask ventilation without difficulty LMA: LMA inserted LMA Size: 4.0 Number of attempts: 1 Airway Equipment and Method: bite block Placement Confirmation: positive ETCO2 Dental Injury: Teeth and Oropharynx as per pre-operative assessment      

## 2013-08-02 NOTE — Op Note (Signed)
Urology Operative Report  Date of Procedure: 08/02/13  Surgeon: Natalia Leatherwood, MD Assistant:  None  Preoperative Diagnosis: Bladder cancer Postoperative Diagnosis:  Same  Procedure(s): Cystoscopy with bladder biopsy. Bilateral retrograde pyelograms with interpretation  Estimated blood loss: None  Specimen: Bladder biopsy sent to pathology  Drains: None  Complications: None  Findings: Ectopic right ureter orifice from previous resection. Negative filling defects in the ureters or renal pelvis he is bilaterally. Good excretion bilaterally. Negative papillary tumors. Flat area of erythema on the right posterior bladder wall.  History of present illness: Patient with a history of high-grade bladder cancer presents today for bladder biopsy due to to recurrent erythematous area in his bladder which could represent inflammation versus CIS.   Procedure in detail: After informed consent was obtained, the patient was taken to the operating room. They were placed in the supine position. SCDs were turned on and in place. IV antibiotics were infused, and general anesthesia was induced. A timeout was performed in which the correct patient, surgical site, and procedure were identified and agreed upon by the team.  The patient was placed in a dorsolithotomy position, making sure to pad all pertinent neurovascular pressure points. The genitals were prepped and draped in the usual sterile fashion.  A rigid cystoscope was advanced through the urethra and into the bladder. The bladder was drained and then fully distended and evaluated in a systematic fashion with a 12 and 70 lens to visualize the entire surface of the bladder. This was negative for papillary bladder tumors. There was an area seen previously on cystoscopy which remained present which was flat and erythematous on the edges of the previous tumor resection on the right posterior bladder wall. There was also noted to be an ectopic right  ureter orifice which had been created during previous resection of bladder tumor. Observation of these ureter orifices revealed effluxed which was clear from the right ectopic ureter orifice and from the left orthotopic ureter orifice. The orthotopic right ureter orifice still connects with the ureter.  A bladder biopsy was carried out with cold cup biopsy forceps to biopsy the area of erythema. All these were sent as one specimen and then the area was fulgurated to maintain hemostasis. This was negative for any gross perforation.  I then performed a right retrograde PolyGram by first cannulating the orthotopic right ureter orifice with a sensor wire into the proximal ureter and then a 5 French ureter catheter was placed over this. I advanced a 5 French catheter beyond the ectopic ureter orifice on the right and injected a total of 15 cc of Omnipaque. This was negative for filling defects in the ureter or collecting system. I then withdrew this distal to the ectopic right ureter orifice and efflux of contrast was seen coming through this. This was negative for hydronephrosis and indeed out and less than 5 minutes.  Attention was turned the left ureter orifice. It was cannulated with a 5 [place] ureter catheter. I injected approximately 10 cc of Omnipaque to obtain a retrograde pyelogram. This was negative for hydronephrosis or filling defects. It indeed out less than 5 minutes later.  His bladder was drained, cystoscope was removed, and I placed 10 cc of lidocaine jelly into the urethra as well as a B. and O. suppository into the rectum.   Anesthesia was reversed, he was placed back in supine position, and he was taken to the [facility] in stable condition.  All counts were correct at the end of the case.

## 2013-08-02 NOTE — H&P (Signed)
Urology History and Physical Exam  CC: Bladder cancer  HPI:  57 year old male presents today for bladder cancer.  This was first discovered in May 2014.  Initial resection with a high-grade non-muscle invasive urothelial carcinoma.  Repeat resection was negative for cancer.  He completed an induction course of BCG in November 2014.  He returned for repeat cystoscopy on 07/06/13.  This revealed an erythematous area posterior lateral right ureteral orifice.  This was flat and velvety appearing concerning for CIS.  This could also represent inflammation from BCG treatment.  Given his history of high-grade bladder cancer R recommend proceeding to the operating room for cystoscopy, bladder biopsy, and bilateral retrograde pyelograms.  We discussed the risks, benefits, alternatives, and likelihood of achieving goals.  Urine culture from 07/06/13 was negative for growth.  This is not associated with gross hematuria.  PMH: Past Medical History  Diagnosis Date  . RBBB   . S/P aortic valve replacement with bioprosthetic valve 05-10-2009   BY DR Barry Dienes    CARDIOLOGIST-  DR Clifton James  . History of aortic insufficiency   . Frequency of urination   . Nocturia   . Bladder cancer     PSH: Past Surgical History  Procedure Laterality Date  . Hydrocele excision / repair  2009  . Cardiac catheterization  08-10-2008  DR MCALHANY    NO EVIDENCE CAD/ MILD GLOBAL LVSF/ MODERATE AORTIC INSUFFICIENCY/ MILD DILATION OF THE AORTIC ROOT/ EF 45-50%  . Biological bentall aortic root replacement w/ pericardial tissue valve and synthetic aortic graft  05-10-2009  DR OWENS    BICUSPID AV W/ SEVERE AI &  ANEURYSM OF AORTIC ROOT AND PROXIMAL ASCENDING THORACIC AORTA  . Transthoracic echocardiogram  NOV 2012  DR MCALHANY    LVEF 50%/ NORMAL FUNCTIONING BIOPROSTHETIC AORTIC VALVE/ CHRONIC RBBB  . Appendectomy  AGE 32  . Transurethral resection of bladder tumor N/A 12/31/2012    Procedure: TRANSURETHRAL RESECTION OF BLADDER  TUMOR (TURBT);  Surgeon: Milford Cage, MD;  Location: [facility];  Service: Urology;  Laterality: N/A;  . Cystoscopy w/ retrogrades Bilateral 12/31/2012    Procedure: CYSTOSCOPY WITH RETROGRADE PYELOGRAM With CYSTOGRAM AND DEEP BLADDER BIOPSY ;  Surgeon: Milford Cage, MD;  Location: [facility];  Service: Urology;  Laterality: Bilateral;  . Cystoscopy with biopsy N/A 02/22/2013    Procedure: CYSTOSCOPY WITH BIOPSY;  Surgeon: Milford Cage, MD;  Location: [facility];  Service: Urology;  Laterality: N/A;  . Transthoracic echocardiogram  07/23/13    Allergies: No Known Allergies  Medications: No prescriptions prior to admission     Social History: History   Social History  . Marital Status: Single    Spouse Name: N/A    Number of Children: N/A  . Years of Education: N/A   Occupational History  . Not on file.   Social History Main Topics  . Smoking status: Former Smoker -- 2.00 packs/day for 25 years    Types: Cigarettes    Quit date: 12/30/2007  . Smokeless tobacco: Never Used  . Alcohol Use: No  . Drug Use: No  . Sexual Activity: Not on file   Other Topics Concern  . Not on file   Social History Narrative   Tobacco use 1 1/2 ppd for 35 years- stopped 2010   No alcohol    No illicit drugs   Single    2 children    Family History: History reviewed. No pertinent family history.  Review  of Systems: Positive: Dysuria, nocturia, frequency. Negative: Gross hematuria, fever, SOB, or chest pain.  A further 10 point review of systems was negative except what is listed in the HPI.  Physical Exam: Filed Vitals:   08/02/13 1109  BP: 108/78  Pulse: 70  Temp: 97.3 F (36.3 C)  Resp: 18    General: No acute distress.  Awake. Head:  Normocephalic.  Atraumatic. ENT:  EOMI.  Mucous membranes moist Neck:  Supple.  No lymphadenopathy. CV:  S1 present. S2 present. Regular rate. Pulmonary: Equal effort  bilaterally.  Clear to auscultation bilaterally. Abdomen: Soft.  Non- tender to palpation. Skin:  Normal turgor.  No visible rash. Extremity: No gross deformity of bilateral upper extremities.  No gross deformity of    bilateral lower extremities. Neurologic: Alert. Appropriate mood.    Studies:  No results found for this basename: HGB, WBC, PLT,  in the last 72 hours  No results found for this basename: NA, K, CL, CO2, BUN, CREATININE, CALCIUM, MAGNESIUM, GFRNONAA, GFRAA,  in the last 72 hours   No results found for this basename: PT, INR, APTT,  in the last 72 hours   No components found with this basename: ABG,     Assessment:  Bladder cancer.  Plan: To OR for cystoscopy, bladder biopsy, and bilateral retrograde pyelograms.

## 2013-08-02 NOTE — Transfer of Care (Signed)
Immediate Anesthesia Transfer of Care Note  Patient: [name]  Procedure(s) Performed: Procedure(s): CYSTOSCOPY WITH BLADDER BIOPSY AND  BILATERAL RETROGRADES (N/A)  Patient Location: PACU  Anesthesia Type:General  Level of Consciousness: awake, alert  and oriented  Airway & Oxygen Therapy: Patient Spontanous Breathing and Patient connected to nasal cannula oxygen  Post-op Assessment: Report given to PACU RN  Post vital signs: Reviewed and stable  Complications: No apparent anesthesia complications

## 2013-08-03 ENCOUNTER — Encounter (HOSPITAL_BASED_OUTPATIENT_CLINIC_OR_DEPARTMENT_OTHER): Payer: Self-pay | Admitting: Urology

## 2013-08-03 NOTE — Anesthesia Postprocedure Evaluation (Signed)
Anesthesia Post Note  Patient: [name]  Procedure(s) Performed: Procedure(s) (LRB): CYSTOSCOPY WITH BLADDER BIOPSY AND  BILATERAL RETROGRADES (N/A)  Anesthesia type: General  Patient location: PACU  Post pain: Pain level controlled  Post assessment: Post-op Vital signs reviewed  Last Vitals:  Filed Vitals:   08/02/13 1507  BP: 116/73  Pulse: 61  Temp: 36.3 C  Resp: 18    Post vital signs: Reviewed  Level of consciousness: sedated  Complications: No apparent anesthesia complications

## 2013-08-03 NOTE — Telephone Encounter (Signed)
Kristopher Hazel, MD Dossie Arbour, RN            I spoke to the pt. cdm

## 2013-08-05 HISTORY — PX: CATARACT EXTRACTION W/ INTRAOCULAR LENS IMPLANT: SHX1309

## 2014-01-02 DIAGNOSIS — M545 Low back pain, unspecified: Secondary | ICD-10-CM | POA: Insufficient documentation

## 2014-03-20 ENCOUNTER — Emergency Department (HOSPITAL_COMMUNITY)
Admission: EM | Admit: 2014-03-20 | Discharge: 2014-03-20 | Disposition: A | Discharge status: Home / Self Care | Payer: Managed Care, Other (non HMO) | Attending: Emergency Medicine | Admitting: Emergency Medicine

## 2014-03-20 ENCOUNTER — Encounter (HOSPITAL_COMMUNITY): Payer: Self-pay | Admitting: Emergency Medicine

## 2014-03-20 ENCOUNTER — Emergency Department (HOSPITAL_COMMUNITY): Payer: Managed Care, Other (non HMO)

## 2014-03-20 DIAGNOSIS — Z7982 Long term (current) use of aspirin: Secondary | ICD-10-CM | POA: Insufficient documentation

## 2014-03-20 DIAGNOSIS — S298XXA Other specified injuries of thorax, initial encounter: Secondary | ICD-10-CM | POA: Diagnosis present

## 2014-03-20 DIAGNOSIS — Z954 Presence of other heart-valve replacement: Secondary | ICD-10-CM | POA: Diagnosis not present

## 2014-03-20 DIAGNOSIS — Z8551 Personal history of malignant neoplasm of bladder: Secondary | ICD-10-CM | POA: Insufficient documentation

## 2014-03-20 DIAGNOSIS — Z79899 Other long term (current) drug therapy: Secondary | ICD-10-CM | POA: Insufficient documentation

## 2014-03-20 DIAGNOSIS — Z8679 Personal history of other diseases of the circulatory system: Secondary | ICD-10-CM | POA: Insufficient documentation

## 2014-03-20 DIAGNOSIS — Z791 Long term (current) use of non-steroidal anti-inflammatories (NSAID): Secondary | ICD-10-CM | POA: Diagnosis not present

## 2014-03-20 DIAGNOSIS — Z87891 Personal history of nicotine dependence: Secondary | ICD-10-CM | POA: Diagnosis not present

## 2014-03-20 DIAGNOSIS — T07XXXA Unspecified multiple injuries, initial encounter: Secondary | ICD-10-CM | POA: Insufficient documentation

## 2014-03-20 DIAGNOSIS — IMO0002 Reserved for concepts with insufficient information to code with codable children: Secondary | ICD-10-CM | POA: Diagnosis not present

## 2014-03-20 DIAGNOSIS — Y9389 Activity, other specified: Secondary | ICD-10-CM | POA: Insufficient documentation

## 2014-03-20 DIAGNOSIS — Y92410 Unspecified street and highway as the place of occurrence of the external cause: Secondary | ICD-10-CM | POA: Diagnosis not present

## 2014-03-20 MED ORDER — CYCLOBENZAPRINE HCL 5 MG PO TABS: 5.0000 mg | ORAL_TABLET | Freq: Three times a day (TID) | ORAL | Status: DC | PRN

## 2014-03-20 MED ORDER — OXYCODONE-ACETAMINOPHEN 5-325 MG PO TABS
2.0000 | ORAL_TABLET | Freq: Once | ORAL | Status: AC
  Administered 2014-03-20: 2 via ORAL
  Filled 2014-03-20: qty 2

## 2014-03-20 MED ORDER — IBUPROFEN 800 MG PO TABS: 800.0000 mg | ORAL_TABLET | Freq: Three times a day (TID) | ORAL | Status: DC | PRN

## 2014-03-20 MED ORDER — OXYCODONE-ACETAMINOPHEN 5-325 MG PO TABS: 1.0000 | ORAL_TABLET | ORAL | Status: DC | PRN

## 2014-03-20 NOTE — Discharge Instructions (Signed)
Abrasion An abrasion is a cut or scrape of the skin. Abrasions do not extend through all layers of the skin and most heal within 10 days. It is important to care for your abrasion properly to prevent infection. CAUSES  Most abrasions are caused by falling on, or gliding across, the ground or other surface. When your skin rubs on something, the outer and inner layer of skin rubs off, causing an abrasion. DIAGNOSIS  Your caregiver will be able to diagnose an abrasion during a physical exam.  TREATMENT  Your treatment depends on how large and deep the abrasion is. Generally, your abrasion will be cleaned with water and a mild soap to remove any dirt or debris. An antibiotic ointment may be put over the abrasion to prevent an infection. A bandage (dressing) may be wrapped around the abrasion to keep it from getting dirty.  You may need a tetanus shot if:  You cannot remember when you had your last tetanus shot.  You have never had a tetanus shot.  The injury broke your skin. If you get a tetanus shot, your arm may swell, get red, and feel warm to the touch. This is common and not a problem. If you need a tetanus shot and you choose not to have one, there is a rare chance of getting tetanus. Sickness from tetanus can be serious.  HOME CARE INSTRUCTIONS   If a dressing was applied, change it at least once a day or as directed by your caregiver. If the bandage sticks, soak it off with warm water.   Wash the area with water and a mild soap to remove all the ointment 2 times a day. Rinse off the soap and pat the area dry with a clean towel.   Reapply any ointment as directed by your caregiver. This will help prevent infection and keep the bandage from sticking. Use gauze over the wound and under the dressing to help keep the bandage from sticking.   Change your dressing right away if it becomes wet or dirty.   Only take over-the-counter or prescription medicines for pain, discomfort, or fever as  directed by your caregiver.   Follow up with your caregiver within 24-48 hours for a wound check, or as directed. If you were not given a wound-check appointment, look closely at your abrasion for redness, swelling, or pus. These are signs of infection. SEEK IMMEDIATE MEDICAL CARE IF:   You have increasing pain in the wound.   You have redness, swelling, or tenderness around the wound.   You have pus coming from the wound.   You have a fever or persistent symptoms for more than 2-3 days.  You have a fever and your symptoms suddenly get worse.  You have a bad smell coming from the wound or dressing.  MAKE SURE YOU:   Understand these instructions.  Will watch your condition.  Will get help right away if you are not doing well or get worse. Document Released: 05/01/2005 Document Revised: 07/08/2012 Document Reviewed: 06/25/2011 [facility] Patient Information 2015 [place], [place]. This information is not intended to replace advice given to you by your health care provider. Make sure you discuss any questions you have with your health care provider.  Contusion A contusion is a deep bruise. Contusions are the result of an injury that caused bleeding under the skin. The contusion may turn blue, purple, or yellow. Minor injuries will give you a painless contusion, but more severe contusions may stay painful and swollen  for a few weeks.  CAUSES  A contusion is usually caused by a blow, trauma, or direct force to an area of the body. SYMPTOMS   Swelling and redness of the injured area.  Bruising of the injured area.  Tenderness and soreness of the injured area.  Pain. DIAGNOSIS  The diagnosis can be made by taking a history and physical exam. An X-ray, CT scan, or MRI may be needed to determine if there were any associated injuries, such as fractures. TREATMENT  Specific treatment will depend on what area of the body was injured. In general, the best treatment for a contusion is  resting, icing, elevating, and applying cold compresses to the injured area. Over-the-counter medicines may also be recommended for pain control. Ask your caregiver what the best treatment is for your contusion. HOME CARE INSTRUCTIONS   Put ice on the injured area.  Put ice in a plastic bag.  Place a towel between your skin and the bag.  Leave the ice on for 15-20 minutes, 3-4 times a day, or as directed by your health care provider.  Only take over-the-counter or prescription medicines for pain, discomfort, or fever as directed by your caregiver. Your caregiver may recommend avoiding anti-inflammatory medicines (aspirin, ibuprofen, and naproxen) for 48 hours because these medicines may increase bruising.  Rest the injured area.  If possible, elevate the injured area to reduce swelling. SEEK IMMEDIATE MEDICAL CARE IF:   You have increased bruising or swelling.  You have pain that is getting worse.  Your swelling or pain is not relieved with medicines. MAKE SURE YOU:   Understand these instructions.  Will watch your condition.  Will get help right away if you are not doing well or get worse. Document Released: 05/01/2005 Document Revised: 07/27/2013 Document Reviewed: 05/27/2011 [facility] Patient Information 2015 [place], [place]. This information is not intended to replace advice given to you by your health care provider. Make sure you discuss any questions you have with your health care provider.  Motor Vehicle Collision After a car crash (motor vehicle collision), it is normal to have bruises and sore muscles. The first 24 hours usually feel the worst. After that, you will likely start to feel better each day. HOME CARE  Put ice on the injured area.  Put ice in a plastic bag.  Place a towel between your skin and the bag.  Leave the ice on for 15-20 minutes, 03-04 times a day.  Drink enough fluids to keep your pee (urine) clear or pale yellow.  Do not drink  alcohol.  Take a warm shower or bath 1 or 2 times a day. This helps your sore muscles.  Return to activities as told by your doctor. Be careful when lifting. Lifting can make neck or back pain worse.  Only take medicine as told by your doctor. Do not use aspirin. GET HELP RIGHT AWAY IF:   Your arms or legs tingle, feel weak, or lose feeling (numbness).  You have headaches that do not get better with medicine.  You have neck pain, especially in the middle of the back of your neck.  You cannot control when you pee (urinate) or poop (bowel movement).  Pain is getting worse in any part of your body.  You are short of breath, dizzy, or pass out (faint).  You have chest pain.  You feel sick to your stomach (nauseous), throw up (vomit), or sweat.  You have belly (abdominal) pain that gets worse.  There is blood  in your pee, poop, or throw up.  You have pain in your shoulder (shoulder strap areas).  Your problems are getting worse. MAKE SURE YOU:   Understand these instructions.  Will watch your condition.  Will get help right away if you are not doing well or get worse. Document Released: 01/08/2008 Document Revised: 10/14/2011 Document Reviewed: 12/19/2010 [facility] Patient Information 2015 [place], [place]. This information is not intended to replace advice given to you by your health care provider. Make sure you discuss any questions you have with your health care provider. RICE: Routine Care for Injuries The routine care of many injuries includes Rest, Ice, Compression, and Elevation (RICE). HOME CARE INSTRUCTIONS  Rest is needed to allow your body to heal. Routine activities can usually be resumed when comfortable. Injured tendons and bones can take up to 6 weeks to heal. Tendons are the cord-like structures that attach muscle to bone.  Ice following an injury helps keep the swelling down and reduces pain.  Put ice in a plastic bag.  Place a towel between your skin and  the bag.  Leave the ice on for 15-20 minutes, 3-4 times a day, or as directed by your health care provider. Do this while awake, for the first 24 to 48 hours. After that, continue as directed by your caregiver.  Compression helps keep swelling down. It also gives support and helps with discomfort. If an elastic bandage has been applied, it should be removed and reapplied every 3 to 4 hours. It should not be applied tightly, but firmly enough to keep swelling down. Watch fingers or toes for swelling, bluish discoloration, coldness, numbness, or excessive pain. If any of these problems occur, remove the bandage and reapply loosely. Contact your caregiver if these problems continue.  Elevation helps reduce swelling and decreases pain. With extremities, such as the arms, hands, legs, and feet, the injured area should be placed near or above the level of the heart, if possible. SEEK IMMEDIATE MEDICAL CARE IF:  You have persistent pain and swelling.  You develop redness, numbness, or unexpected weakness.  Your symptoms are getting worse rather than improving after several days. These symptoms may indicate that further evaluation or further X-rays are needed. Sometimes, X-rays may not show a small broken bone (fracture) until 1 week or 10 days later. Make a follow-up appointment with your caregiver. Ask when your X-ray results will be ready. Make sure you get your X-ray results. Document Released: 11/03/2000 Document Revised: 07/27/2013 Document Reviewed: 12/21/2010 [facility] Patient Information 2015 [place], [place]. This information is not intended to replace advice given to you by your health care provider. Make sure you discuss any questions you have with your health care provider.
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
## 2014-08-17 ENCOUNTER — Encounter: Payer: Self-pay | Admitting: Cardiovascular Disease

## 2014-08-17 ENCOUNTER — Ambulatory Visit (INDEPENDENT_AMBULATORY_CARE_PROVIDER_SITE_OTHER): Payer: Managed Care, Other (non HMO) | Admitting: Cardiovascular Disease

## 2014-08-17 VITALS — BP 104/82 | HR 72 | Ht 72.0 in | Wt 196.6 lb

## 2014-08-17 DIAGNOSIS — I359 Nonrheumatic aortic valve disorder, unspecified: Secondary | ICD-10-CM

## 2014-08-17 NOTE — Progress Notes (Signed)
History of Present Illness: 59 yo WM with history of bicuspid aortic valve now s/p aortic valve repair with aortic root replacement by Dr. Roxy Manns 05/10/09 here today for follow up. Echo 07/23/13 with LVEF of 50-55%, normal functioning bioprosthetic aortic valve. He has a chronic RBBB. Bladder tumor resected July 2014 and he feels that things are stable.   He is here today for follow up. He continues to do well following his valve replacement. He denies chest pain, SOB, palpitations. He continues to ride his bike over 100 miles per week.    Primary Care Physician: Scarlette Ar  Past Medical History  Diagnosis Date  . RBBB   . S/P aortic valve replacement with bioprosthetic valve 05-10-2009   BY DR Ricard Dillon    CARDIOLOGIST-  DR Angelena Form  . History of aortic insufficiency   . Frequency of urination   . Nocturia   . Bladder cancer     Past Surgical History  Procedure Laterality Date  . Hydrocele excision / repair  2009  . Cardiac catheterization  08-10-2008  DR MCALHANY    NO EVIDENCE CAD/ MILD GLOBAL LVSF/ MODERATE AORTIC INSUFFICIENCY/ MILD DILATION OF THE AORTIC ROOT/ EF 45-50%  . Biological bentall aortic root replacement w/ pericardial tissue valve and synthetic aortic graft  05-10-2009  DR OWENS    BICUSPID AV W/ SEVERE AI &  ANEURYSM OF AORTIC ROOT AND PROXIMAL ASCENDING THORACIC AORTA  . Transthoracic echocardiogram  NOV 2012  DR MCALHANY    LVEF 50%/ NORMAL FUNCTIONING BIOPROSTHETIC AORTIC VALVE/ CHRONIC RBBB  . Appendectomy  AGE 89  . Transurethral resection of bladder tumor N/A 12/31/2012    Procedure: TRANSURETHRAL RESECTION OF BLADDER TUMOR (TURBT);  Surgeon: Molli Hazard, MD;  Location: [facility];  Service: Urology;  Laterality: N/A;  . Cystoscopy w/ retrogrades Bilateral 12/31/2012    Procedure: CYSTOSCOPY WITH RETROGRADE PYELOGRAM With CYSTOGRAM AND DEEP BLADDER BIOPSY ;  Surgeon: Molli Hazard, MD;  Location: [facility];   Service: Urology;  Laterality: Bilateral;  . Cystoscopy with biopsy N/A 02/22/2013    Procedure: CYSTOSCOPY WITH BIOPSY;  Surgeon: Molli Hazard, MD;  Location: [facility];  Service: Urology;  Laterality: N/A;  . Transthoracic echocardiogram  07/23/13  . Cystoscopy with biopsy N/A 08/02/2013    Procedure: CYSTOSCOPY WITH BLADDER BIOPSY AND  BILATERAL RETROGRADES;  Surgeon: Molli Hazard, MD;  Location[facility];  Service: Urology;  Laterality: N/A;    Current Outpatient Prescriptions  Medication Sig Dispense Refill  . aspirin EC 81 MG tablet Take 81 mg by mouth daily.    [name] ibuprofen (ADVIL,MOTRIN) 200 MG tablet Take 600 mg by mouth every 6 (six) hours as needed for moderate pain.    . meloxicam (MOBIC) 7.5 MG tablet Take 7.5 mg by mouth daily.    [name] omega-3 acid ethyl esters (LOVAZA) 1 G capsule Take 1 g by mouth daily.    . phenazopyridine (PYRIDIUM) 200 MG tablet Take 1 tablet (200 mg total) by mouth every 8 (eight) hours as needed for pain (Burning urination.  Will turn urine and body fluids orange.). 60 tablet 3  . tamsulosin (FLOMAX) 0.4 MG CAPS capsule Take 0.4 mg by mouth at bedtime.     [name] atenolol (TENORMIN) 25 MG tablet Take 25 mg by mouth every morning.    . Multiple Vitamin (MULTIVITAMIN) tablet Take 1 tablet by mouth daily.      No current facility-administered medications for  this visit.    No Known Allergies  History   Social History  . Marital Status: Single    Spouse Name: N/A    Number of Children: N/A  . Years of Education: N/A   Occupational History  . Not on file.   Social History Main Topics  . Smoking status: Former Smoker -- 2.00 packs/day for 25 years    Types: Cigarettes    Quit date: 12/30/2007  . Smokeless tobacco: Never Used  . Alcohol Use: No  . Drug Use: No  . Sexual Activity: Not on file   Other Topics Concern  . Not on file   Social History Narrative   Tobacco use 1 1/2 ppd for 35 years-  stopped 2010   No alcohol    No illicit drugs   Single    2 children    No family history on file.  Review of Systems:  As stated in the HPI and otherwise negative.   BP 104/82 mmHg  Pulse 72  Ht 6' (1.829 m)  Wt 196 lb 9.6 oz (89.177 kg)  BMI 26.66 kg/m2  SpO2 98%  Physical Examination: General: Well developed, well nourished, NAD HEENT: OP clear, mucus membranes moist SKIN: warm, dry. No rashes. Neuro: No focal deficits Musculoskeletal: Muscle strength 5/5 all ext Psychiatric: Mood and affect normal Neck: No JVD, no carotid bruits, no thyromegaly, no lymphadenopathy. Lungs:Clear bilaterally, no wheezes, rhonci, crackles Cardiovascular: Regular rate and rhythm. No murmurs, gallops or rubs. Abdomen:Soft. Bowel sounds present. Non-tender.  Extremities: No lower extremity edema. Pulses are 2 + in the bilateral DP/PT.  Echo 07/23/13: Left ventricle: The cavity size was normal. Wall thickness was increased in a pattern of mild LVH. Systolic function was normal. The estimated ejection fraction was in the range of 50% to 55%. Wall motion was normal; there were no regional wall motion abnormalities. - Aortic valve: Leaflets of bioprosthetic AVR are seen opening. There is normal function. Peak velocity: 267cm/s (S). Mean gradient: 70mm Hg (S). Peak gradient: 32mm Hg (S). - Aorta: Prosthetic aortic root is 12mm. - Right ventricle: The cavity size was mildly dilated. Systolic function was normal. - Right atrium: The atrium was mildly dilated.  EKG: NSR, rate 72 bpm. RBBB.   Assessment and Plan:   1. AORTIC VALVE STENOSIS s/p AVR: Stable. Echo December 2014 with normally functioning valve, normal LV function. He has been using antibiotics before dental visits.

## 2014-08-17 NOTE — Patient Instructions (Signed)
Your physician wants you to follow-up in:  12 months.  You will receive a reminder letter in the mail two months in advance. If you don't receive a letter, please call our office to schedule the follow-up appointment.   

## 2014-09-20 ENCOUNTER — Other Ambulatory Visit: Payer: Self-pay | Admitting: Orthopedic Surgery

## 2014-09-20 DIAGNOSIS — M542 Cervicalgia: Secondary | ICD-10-CM

## 2014-09-28 ENCOUNTER — Ambulatory Visit
Admission: RE | Admit: 2014-09-28 | Discharge: 2014-09-28 | Disposition: A | Discharge status: Home / Self Care | Payer: Managed Care, Other (non HMO) | Source: Ambulatory Visit | Attending: Orthopedic Surgery | Admitting: Orthopedic Surgery

## 2014-09-28 DIAGNOSIS — M542 Cervicalgia: Secondary | ICD-10-CM

## 2015-08-24 ENCOUNTER — Encounter: Payer: Self-pay | Admitting: Gastroenterology

## 2015-09-18 ENCOUNTER — Ambulatory Visit (INDEPENDENT_AMBULATORY_CARE_PROVIDER_SITE_OTHER): Payer: Managed Care, Other (non HMO) | Admitting: Cardiovascular Disease

## 2015-09-18 ENCOUNTER — Encounter: Payer: Self-pay | Admitting: Cardiovascular Disease

## 2015-09-18 VITALS — BP 110/88 | HR 70 | Ht 72.0 in | Wt 200.0 lb

## 2015-09-18 DIAGNOSIS — I359 Nonrheumatic aortic valve disorder, unspecified: Secondary | ICD-10-CM | POA: Diagnosis not present

## 2015-09-18 NOTE — Patient Instructions (Signed)

## 2015-09-18 NOTE — Progress Notes (Signed)
Chief Complaint  Patient presents with  . Follow-up    pt c/o SOB on exertion    History of Present Illness: 60 yo WM with history of bicuspid aortic valve now s/p aortic valve repair with aortic root replacement by Dr. Roxy Manns 05/10/09 here today for follow up. Echo 07/23/13 with LVEF of 50-55%, normal functioning bioprosthetic aortic valve. He has a chronic RBBB. Bladder tumor resected July 2014.    He is here today for follow up. He continues to do well following his valve replacement. He denies chest pain, SOB, palpitations. He continues to ride his bike over 50 miles per week. He retired several weeks ago.   Primary Care Physician: Scarlette Ar  Past Medical History  Diagnosis Date  . RBBB   . S/P aortic valve replacement with bioprosthetic valve 05-10-2009   BY DR Ricard Dillon    CARDIOLOGIST-  DR Angelena Form  . History of aortic insufficiency   . Frequency of urination   . Nocturia   . Bladder cancer [facility])     Past Surgical History  Procedure Laterality Date  . Hydrocele excision / repair  2009  . Cardiac catheterization  08-10-2008  DR MCALHANY    NO EVIDENCE CAD/ MILD GLOBAL LVSF/ MODERATE AORTIC INSUFFICIENCY/ MILD DILATION OF THE AORTIC ROOT/ EF 45-50%  . Biological bentall aortic root replacement w/ pericardial tissue valve and synthetic aortic graft  05-10-2009  DR OWENS    BICUSPID AV W/ SEVERE AI &  ANEURYSM OF AORTIC ROOT AND PROXIMAL ASCENDING THORACIC AORTA  . Transthoracic echocardiogram  NOV 2012  DR MCALHANY    LVEF 50%/ NORMAL FUNCTIONING BIOPROSTHETIC AORTIC VALVE/ CHRONIC RBBB  . Appendectomy  AGE 47  . Transurethral resection of bladder tumor N/A 12/31/2012    Procedure: TRANSURETHRAL RESECTION OF BLADDER TUMOR (TURBT);  Surgeon: Molli Hazard, MD;  Location: [facility];  Service: Urology;  Laterality: N/A;  . Cystoscopy w/ retrogrades Bilateral 12/31/2012    Procedure: CYSTOSCOPY WITH RETROGRADE PYELOGRAM With CYSTOGRAM AND DEEP BLADDER  BIOPSY ;  Surgeon: Molli Hazard, MD;  Location: [facility];  Service: Urology;  Laterality: Bilateral;  . Cystoscopy with biopsy N/A 02/22/2013    Procedure: CYSTOSCOPY WITH BIOPSY;  Surgeon: Molli Hazard, MD;  Location: [facility];  Service: Urology;  Laterality: N/A;  . Transthoracic echocardiogram  07/23/13  . Cystoscopy with biopsy N/A 08/02/2013    Procedure: CYSTOSCOPY WITH BLADDER BIOPSY AND  BILATERAL RETROGRADES;  Surgeon: Molli Hazard, MD;  Location: [facility];  Service: Urology;  Laterality: N/A;    Current Outpatient Prescriptions  Medication Sig Dispense Refill  . aspirin EC 81 MG tablet Take 81 mg by mouth daily.    . meloxicam (MOBIC) 7.5 MG tablet Take 7.5 mg by mouth daily.    . Multiple Vitamin (MULTIVITAMIN) tablet Take 1 tablet by mouth daily.     [name] omega-3 acid ethyl esters (LOVAZA) 1 G capsule Take 1 g by mouth daily.    . tamsulosin (FLOMAX) 0.4 MG CAPS capsule Take 0.4 mg by mouth at bedtime.     [name] atenolol (TENORMIN) 25 MG tablet Take 25 mg by mouth every morning.     No current facility-administered medications for this visit.    No Known Allergies  Social History   Social History  . Marital Status: Single    Spouse Name: N/A  . Number of Children: N/A  . Years of Education: N/A   Occupational  History  . Not on file.   Social History Main Topics  . Smoking status: Former Smoker -- 2.00 packs/day for 25 years    Types: Cigarettes    Quit date: 12/30/2007  . Smokeless tobacco: Never Used  . Alcohol Use: No  . Drug Use: No  . Sexual Activity: Not on file   Other Topics Concern  . Not on file   Social History Narrative   Tobacco use 1 1/2 ppd for 35 years- stopped 2010   No alcohol    No illicit drugs   Single    2 children    No family history on file.  Review of Systems:  As stated in the HPI and otherwise negative.   BP 110/88 mmHg  Pulse 70  Ht 6' (1.829 m)   Wt 200 lb (90.719 kg)  BMI 27.12 kg/m2  SpO2 98%  Physical Examination: General: Well developed, well nourished, NAD HEENT: OP clear, mucus membranes moist SKIN: warm, dry. No rashes. Neuro: No focal deficits Musculoskeletal: Muscle strength 5/5 all ext Psychiatric: Mood and affect normal Neck: No JVD, no carotid bruits, no thyromegaly, no lymphadenopathy. Lungs:Clear bilaterally, no wheezes, rhonci, crackles Cardiovascular: Regular rate and rhythm. No murmurs, gallops or rubs. Abdomen:Soft. Bowel sounds present. Non-tender.  Extremities: No lower extremity edema. Pulses are 2 + in the bilateral DP/PT.  Echo 07/23/13: Left ventricle: The cavity size was normal. Wall thickness was increased in a pattern of mild LVH. Systolic function was normal. The estimated ejection fraction was in the range of 50% to 55%. Wall motion was normal; there were no regional wall motion abnormalities. - Aortic valve: Leaflets of bioprosthetic AVR are seen opening. There is normal function. Peak velocity: 267cm/s (S). Mean gradient: 42mm Hg (S). Peak gradient: 55mm Hg (S). - Aorta: Prosthetic aortic root is 15mm. - Right ventricle: The cavity size was mildly dilated. Systolic function was normal. - Right atrium: The atrium was mildly dilated.  EKG:  EKG is ordered today. The ekg ordered today demonstrates NSR, rate 66 bpm. RBBB  Recent Labs: No results found for requested labs within last 365 days.   Lipid Panel No results found for: CHOL, TRIG, HDL, CHOLHDL, VLDL, LDLCALC, LDLDIRECT   Wt Readings from Last 3 Encounters:  09/18/15 200 lb (90.719 kg)  08/17/14 196 lb 9.6 oz (89.177 kg)  08/02/13 187 lb (84.823 kg)     Other studies Reviewed: Additional studies/ records that were reviewed today include: . Review of the above records demonstrates:    Assessment and Plan:   1. AORTIC VALVE STENOSIS s/p AVR: Stable. Echo December 2014 with normally functioning valve, normal  LV function. He has been using antibiotics before dental visits.   Current medicines are reviewed at length with the patient today.  The patient does not have concerns regarding medicines.  The following changes have been made:  no change  Labs/ tests ordered today include:   Orders Placed This Encounter  Procedures  . EKG 12-Lead     Disposition:   FU with me in 12  months   Signed, Lauree Chandler, MD 09/18/2015 2:40 PM    [facility] [address], [place], [place]  [address] Phone: [phone]; Fax: [phone]

## 2015-10-03 ENCOUNTER — Other Ambulatory Visit: Payer: Self-pay | Admitting: Urology

## 2015-10-05 ENCOUNTER — Encounter (HOSPITAL_BASED_OUTPATIENT_CLINIC_OR_DEPARTMENT_OTHER): Payer: Self-pay | Admitting: *Deleted

## 2015-10-06 ENCOUNTER — Encounter (HOSPITAL_BASED_OUTPATIENT_CLINIC_OR_DEPARTMENT_OTHER): Payer: Self-pay | Admitting: *Deleted

## 2015-10-06 NOTE — Progress Notes (Signed)
NPO AFTER MN.  ARRIVE AT 0830.  NEEDS ISTAT.  CURRENT EKG IN CHART AND EPIC.  WILL TAKE ATENOLOL AM DOS W/ SIPS OF WATER.  

## 2015-10-12 ENCOUNTER — Encounter (HOSPITAL_BASED_OUTPATIENT_CLINIC_OR_DEPARTMENT_OTHER)
Admission: RE | Disposition: A | Discharge status: Home / Self Care | Payer: Self-pay | Source: Ambulatory Visit | Attending: Urology

## 2015-10-12 ENCOUNTER — Ambulatory Visit (HOSPITAL_BASED_OUTPATIENT_CLINIC_OR_DEPARTMENT_OTHER): Payer: Managed Care, Other (non HMO) | Admitting: Anesthesiology

## 2015-10-12 ENCOUNTER — Ambulatory Visit (HOSPITAL_BASED_OUTPATIENT_CLINIC_OR_DEPARTMENT_OTHER)
Admission: RE | Admit: 2015-10-12 | Discharge: 2015-10-12 | Disposition: A | Discharge status: Home / Self Care | Payer: Managed Care, Other (non HMO) | Source: Ambulatory Visit | Attending: Urology | Admitting: Urology

## 2015-10-12 ENCOUNTER — Encounter (HOSPITAL_BASED_OUTPATIENT_CLINIC_OR_DEPARTMENT_OTHER): Payer: Self-pay | Admitting: *Deleted

## 2015-10-12 DIAGNOSIS — C673 Malignant neoplasm of anterior wall of bladder: Secondary | ICD-10-CM | POA: Diagnosis not present

## 2015-10-12 DIAGNOSIS — Z87891 Personal history of nicotine dependence: Secondary | ICD-10-CM | POA: Insufficient documentation

## 2015-10-12 DIAGNOSIS — Z952 Presence of prosthetic heart valve: Secondary | ICD-10-CM | POA: Insufficient documentation

## 2015-10-12 DIAGNOSIS — Z79899 Other long term (current) drug therapy: Secondary | ICD-10-CM | POA: Diagnosis not present

## 2015-10-12 DIAGNOSIS — Z7982 Long term (current) use of aspirin: Secondary | ICD-10-CM | POA: Diagnosis not present

## 2015-10-12 DIAGNOSIS — I499 Cardiac arrhythmia, unspecified: Secondary | ICD-10-CM | POA: Insufficient documentation

## 2015-10-12 DIAGNOSIS — C679 Malignant neoplasm of bladder, unspecified: Secondary | ICD-10-CM | POA: Diagnosis present

## 2015-10-12 HISTORY — PX: CYSTOSCOPY W/ RETROGRADES: SHX1426

## 2015-10-12 HISTORY — PX: TRANSURETHRAL RESECTION OF BLADDER TUMOR: SHX2575

## 2015-10-12 LAB — POCT I-STAT, CHEM 8
BUN: 21 mg/dL — ABNORMAL HIGH (ref 6–20)
CALCIUM ION: 1.23 mmol/L (ref 1.13–1.30)
CHLORIDE: 104 mmol/L (ref 101–111)
Creatinine, Ser: 0.8 mg/dL (ref 0.61–1.24)
Glucose, Bld: 107 mg/dL — ABNORMAL HIGH (ref 65–99)
HCT: 47 % (ref 39.0–52.0)
HEMOGLOBIN: 16 g/dL (ref 13.0–17.0)
Potassium: 4.2 mmol/L (ref 3.5–5.1)
SODIUM: 142 mmol/L (ref 135–145)
TCO2: 26 mmol/L (ref 0–100)

## 2015-10-12 SURGERY — CYSTOSCOPY, WITH RETROGRADE PYELOGRAM
Anesthesia: General | Site: Bladder

## 2015-10-12 MED ORDER — FENTANYL CITRATE (PF) 100 MCG/2ML IJ SOLN
INTRAMUSCULAR | Status: AC
  Filled 2015-10-12: qty 2

## 2015-10-12 MED ORDER — PHENAZOPYRIDINE HCL 100 MG PO TABS
ORAL_TABLET | ORAL | Status: AC
  Filled 2015-10-12: qty 2

## 2015-10-12 MED ORDER — LIDOCAINE HCL (CARDIAC) 20 MG/ML IV SOLN
INTRAVENOUS | Status: DC | PRN
  Administered 2015-10-12: 100 mg via INTRAVENOUS

## 2015-10-12 MED ORDER — PROPOFOL 10 MG/ML IV BOLUS
INTRAVENOUS | Status: DC | PRN
  Administered 2015-10-12: 200 mg via INTRAVENOUS

## 2015-10-12 MED ORDER — LACTATED RINGERS IV SOLN
INTRAVENOUS | Status: DC
  Administered 2015-10-12: 10:00:00 via INTRAVENOUS
  Filled 2015-10-12: qty 1000

## 2015-10-12 MED ORDER — ONDANSETRON HCL 4 MG/2ML IJ SOLN
INTRAMUSCULAR | Status: AC
  Filled 2015-10-12: qty 4

## 2015-10-12 MED ORDER — LIDOCAINE HCL (CARDIAC) 20 MG/ML IV SOLN
INTRAVENOUS | Status: AC
  Filled 2015-10-12: qty 5

## 2015-10-12 MED ORDER — TRAMADOL HCL 50 MG PO TABS: 50.0000 mg | ORAL_TABLET | Freq: Four times a day (QID) | ORAL | Status: DC | PRN

## 2015-10-12 MED ORDER — ONDANSETRON HCL 4 MG/2ML IJ SOLN
INTRAMUSCULAR | Status: DC | PRN
  Administered 2015-10-12: 4 mg via INTRAVENOUS

## 2015-10-12 MED ORDER — PHENAZOPYRIDINE HCL 200 MG PO TABS
200.0000 mg | ORAL_TABLET | Freq: Three times a day (TID) | ORAL | Status: DC
  Administered 2015-10-12: 200 mg via ORAL
  Filled 2015-10-12: qty 1

## 2015-10-12 MED ORDER — KETOROLAC TROMETHAMINE 30 MG/ML IJ SOLN
INTRAMUSCULAR | Status: AC
  Filled 2015-10-12: qty 1

## 2015-10-12 MED ORDER — CIPROFLOXACIN IN D5W 400 MG/200ML IV SOLN
INTRAVENOUS | Status: AC
  Filled 2015-10-12: qty 200

## 2015-10-12 MED ORDER — CIPROFLOXACIN IN D5W 400 MG/200ML IV SOLN
400.0000 mg | INTRAVENOUS | Status: AC
  Administered 2015-10-12: 400 mg via INTRAVENOUS
  Filled 2015-10-12: qty 200

## 2015-10-12 MED ORDER — PROPOFOL 10 MG/ML IV BOLUS
INTRAVENOUS | Status: AC
  Filled 2015-10-12: qty 20

## 2015-10-12 MED ORDER — FENTANYL CITRATE (PF) 100 MCG/2ML IJ SOLN
25.0000 ug | INTRAMUSCULAR | Status: DC | PRN
  Filled 2015-10-12: qty 1

## 2015-10-12 MED ORDER — FENTANYL CITRATE (PF) 100 MCG/2ML IJ SOLN
INTRAMUSCULAR | Status: DC | PRN
  Administered 2015-10-12 (×4): 25 ug via INTRAVENOUS

## 2015-10-12 MED ORDER — MITOMYCIN CHEMO FOR BLADDER INSTILLATION 40 MG
40.0000 mg | Freq: Once | INTRAVENOUS | Status: DC
  Filled 2015-10-12: qty 40

## 2015-10-12 MED ORDER — EPHEDRINE SULFATE 50 MG/ML IJ SOLN
INTRAMUSCULAR | Status: DC | PRN
  Administered 2015-10-12 (×2): 10 mg via INTRAVENOUS

## 2015-10-12 MED ORDER — BELLADONNA ALKALOIDS-OPIUM 16.2-60 MG RE SUPP
RECTAL | Status: DC | PRN
  Administered 2015-10-12: 1 via RECTAL

## 2015-10-12 MED ORDER — HYDROCODONE-ACETAMINOPHEN 7.5-325 MG PO TABS
1.0000 | ORAL_TABLET | Freq: Once | ORAL | Status: DC | PRN
  Filled 2015-10-12: qty 1

## 2015-10-12 MED ORDER — PROMETHAZINE HCL 25 MG/ML IJ SOLN
6.2500 mg | INTRAMUSCULAR | Status: DC | PRN
  Filled 2015-10-12: qty 1

## 2015-10-12 MED ORDER — MIDAZOLAM HCL 5 MG/5ML IJ SOLN
INTRAMUSCULAR | Status: DC | PRN
  Administered 2015-10-12: 2 mg via INTRAVENOUS

## 2015-10-12 MED ORDER — IOHEXOL 350 MG/ML SOLN
INTRAVENOUS | Status: DC | PRN
  Administered 2015-10-12: 15 mL

## 2015-10-12 MED ORDER — KETOROLAC TROMETHAMINE 30 MG/ML IJ SOLN
INTRAMUSCULAR | Status: DC | PRN
  Administered 2015-10-12: 30 mg via INTRAVENOUS

## 2015-10-12 MED ORDER — DEXAMETHASONE SODIUM PHOSPHATE 10 MG/ML IJ SOLN
INTRAMUSCULAR | Status: AC
  Filled 2015-10-12: qty 1

## 2015-10-12 MED ORDER — BELLADONNA ALKALOIDS-OPIUM 16.2-60 MG RE SUPP
RECTAL | Status: AC
  Filled 2015-10-12: qty 1

## 2015-10-12 MED ORDER — DEXAMETHASONE SODIUM PHOSPHATE 10 MG/ML IJ SOLN
INTRAMUSCULAR | Status: DC | PRN
  Administered 2015-10-12: 10 mg via INTRAVENOUS

## 2015-10-12 MED ORDER — PHENAZOPYRIDINE HCL 200 MG PO TABS: 200.0000 mg | ORAL_TABLET | Freq: Three times a day (TID) | ORAL | Status: DC | PRN

## 2015-10-12 MED ORDER — MIDAZOLAM HCL 2 MG/2ML IJ SOLN
INTRAMUSCULAR | Status: AC
  Filled 2015-10-12: qty 2

## 2015-10-12 MED ORDER — SODIUM CHLORIDE 0.9 % IR SOLN
Status: DC | PRN
  Administered 2015-10-12: 3000 mL
  Administered 2015-10-12: 1000 mL

## 2015-10-12 SURGICAL SUPPLY — 25 items
BAG DRAIN URO-CYSTO SKYTR STRL (DRAIN) ×4 IMPLANT
BLADE SURG 15 STRL LF DISP TIS (BLADE) IMPLANT
BLADE SURG 15 STRL SS (BLADE)
CATH FOLEY 2WAY SLVR 18FR 30CC (CATHETERS) ×4 IMPLANT
CATH FOLEY 3WAY 30CC 22FR (CATHETERS) ×4 IMPLANT
CLOTH BEACON ORANGE TIMEOUT ST (SAFETY) ×4 IMPLANT
ELECT REM PT RETURN 9FT ADLT (ELECTROSURGICAL) ×4
ELECTRODE REM PT RTRN 9FT ADLT (ELECTROSURGICAL) ×2 IMPLANT
EVACUATOR MICROVAS BLADDER (UROLOGICAL SUPPLIES) IMPLANT
GLOVE BIO SURGEON STRL SZ7.5 (GLOVE) ×4 IMPLANT
GOWN STRL REUS W/ TWL XL LVL3 (GOWN DISPOSABLE) ×4 IMPLANT
GOWN STRL REUS W/TWL XL LVL3 (GOWN DISPOSABLE) ×4
HOLDER FOLEY CATH W/STRAP (MISCELLANEOUS) ×4 IMPLANT
IV NS 1000ML (IV SOLUTION) ×2
IV NS 1000ML BAXH (IV SOLUTION) ×2 IMPLANT
IV NS IRRIG 3000ML ARTHROMATIC (IV SOLUTION) ×4 IMPLANT
KIT ROOM TURNOVER WOR (KITS) ×4 IMPLANT
MANIFOLD NEPTUNE II (INSTRUMENTS) ×4 IMPLANT
PACK CYSTO (CUSTOM PROCEDURE TRAY) ×4 IMPLANT
PLUG CATH AND CAP STER (CATHETERS) ×4 IMPLANT
SUT ETHILON 3 0 PS 1 (SUTURE) IMPLANT
SYR 30ML LL (SYRINGE) IMPLANT
TUBE CONNECTING 12'X1/4 (SUCTIONS) ×1
TUBE CONNECTING 12X1/4 (SUCTIONS) ×3 IMPLANT
WATER STERILE IRR 1000ML UROMA (IV SOLUTION) ×4 IMPLANT

## 2015-10-12 NOTE — Interval H&P Note (Signed)
History and Physical Interval Note:  10/12/2015 9:48 AM  [name]  has presented today for surgery, with the diagnosis of recurrent bladder tumor  The various methods of treatment have been discussed with the patient and family. After consideration of risks, benefits and other options for treatment, the patient has consented to  Procedure(s): BILATERAL RETROGRADE PYELOGRAM AND POST OP MITOMYCIN C (Bilateral) TRANSURETHRAL RESECTION OF BLADDER TUMOR (TURBT) (N/A) as a surgical intervention .  The patient's history has been reviewed, patient examined, no change in status, stable for surgery.  I have reviewed the patient's chart and labs.  Questions were answered to the patient's satisfaction.     Louis Meckel W

## 2015-10-12 NOTE — Op Note (Signed)
Preoperative diagnosis:  1. Recurrent bladder tumor   Postoperative diagnosis:  1. Same - <0.8mm    Procedure: 1. Bilateral retrograde pyelograms 2. Bladder biopsy 3. Instillation of post-op [facility]  Surgeon: Ardis Hughs, MD  Anesthesia: General  Complications: None  Intraoperative findings: Small tumor identified on the anterior bladder wall/bladder dome which was removed with the cold cup biopsy forceps. There was an additional area the right lateral lobe of the prostate  approaching the bladder neck which was suspicious and also biopsied. The areas were fulgurated. Additionally on cystoscopy a native ureteral orifice was noted on the right as well as a neo-ureteral orifice posterior to this around the area of a previously resected bladder tumor. Retrograde pyelograms were performed using 10 mL of Omnipaque contrast bilaterally. Findings were consistent with a normal caliber ureter, no hydronephrosis, and sharp calyces within the kidneys bilaterally.  There were no filling defects.  EBL: Minimal  Specimens: None  Indication: [name] is a 60 y.o. patient with recurrence of his bladder cancer.  After reviewing the management options for treatment, he elected to proceed with the above surgical procedure(s). We have discussed the potential benefits and risks of the procedure, side effects of the proposed treatment, the likelihood of the patient achieving the goals of the procedure, and any potential problems that might occur during the procedure or recuperation. Informed consent has been obtained.  Description of procedure:  The patient was taken to the operating room and general anesthesia was induced.  The patient was placed in the dorsal lithotomy position, prepped and draped in the usual sterile fashion, and preoperative antibiotics were administered. A preoperative time-out was performed.   A 30 cystoscope was then gently passed to the patient's urethra and into the  bladder under visual guidance. The bladder was then emptied and slowly filled to 360 cystoscopic evaluation was performed with the findings. I performed retrograde pyelogram bilaterally with a 5 [place] open-ended ureteral catheter with the above findings. I then exchanged the 30 lens for a 70 lens and repeated the cystoscopic evaluation with no additional findings. I then replaced the 70 with the 30 lens again and this time using the cold cup labs he forceps remove the tumor at the anterior bladder dome. I then pulled back to the prostatic urethra and biopsied the suspicious area in the right prostatic lateral lobe. The biopsied areas were then fulgurated with the Bugbee cautery to achieve excellent hemostasis.  I then removed the cystoscope and passed an 39 French Foley catheter. The bladder was then subsequently drained.  The patient was subsequently awoken and returned to the PACU in stable condition. At this time, a 40 mL syringe of 40 mg of mitomycin-C was inserted into the patient's catheter and the catheter capped. The patient will stay in the PACU for 60 minutes along the mitomycin-C to well prior to removal.  Ardis Hughs, M.D.

## 2015-10-12 NOTE — Anesthesia Procedure Notes (Signed)
Procedure Name: LMA Insertion Date/Time: 10/12/2015 9:56 AM Performed by: Justice Rocher Pre-anesthesia Checklist: Patient identified, Emergency Drugs available, Suction available and Patient being monitored Patient Re-evaluated:Patient Re-evaluated prior to inductionOxygen Delivery Method: Circle System Utilized Preoxygenation: Pre-oxygenation with 100% oxygen Intubation Type: IV induction Ventilation: Mask ventilation without difficulty LMA: LMA inserted LMA Size: 5.0 Number of attempts: 1 Airway Equipment and Method: Bite block Placement Confirmation: positive ETCO2 Tube secured with: Tape Dental Injury: Teeth and Oropharynx as per pre-operative assessment

## 2015-10-12 NOTE — Anesthesia Postprocedure Evaluation (Signed)
Anesthesia Post Note  Patient: [name]  Procedure(s) Performed: Procedure(s) (LRB): BILATERAL RETROGRADE PYELOGRAM AND POST OP MITOMYCIN C (Bilateral) TRANSURETHRAL RESECTION OF BLADDER TUMOR (TURBT) (N/A)  Patient location during evaluation: PACU Anesthesia Type: General Level of consciousness: awake and alert Pain management: pain level controlled Vital Signs Assessment: post-procedure vital signs reviewed and stable Respiratory status: spontaneous breathing, nonlabored ventilation, respiratory function stable and patient connected to nasal cannula oxygen Cardiovascular status: blood pressure returned to baseline and stable Postop Assessment: no signs of nausea or vomiting Anesthetic complications: no    Last Vitals:  Filed Vitals:   10/12/15 1047 10/12/15 1055  BP: 117/78   Pulse: 76 74  Temp: 36.7 C   Resp: 11 13    Last Pain: There were no vitals filed for this visit.               Zenaida Deed

## 2015-10-12 NOTE — H&P (Signed)
Reason For Visit f/u for bladder cancer   History of Present Illness Bladder cancer: UCC high grade  12/2012: TURBT- UCC high grade non-muscle invasive  02/2013: TURBT: No remaining cancer, muscle present and negative.  07/2013: biopsy- no cancer  11/2014: Biopsy for clinic cysto - [facility]    BCG:   Induction Oct-Nov 2014  Maintenance: May-June 2015    Imaging: CT 12/16/12: possible bladder mass. Left mid pole subcentimeter cyst.    Cysto:   July 2015 - neg  Oct 2015 - neg  April 2016- small papillary mass biopsied/resected in clinic - [facility]  July 2016- very small papillary lesion left posterior wall, removed and fulgurated. - Benign  October 2016 - negative    LUTS: dysuria,   Tried taking flomax and stopped b/c of retrograde ejaculation  currently: rapaflo and uribel    Interval: Patient presents today for follow-up. Last cysto four months ago was negative. No new pains. Appetite good, weight stable.  He denies any gross hematuria. Patient has intermittent trouble voiding. He continues on Flomax. This seems to be helping him. Currently, the patient is asymptomatic. He denies any dysuria, worsening incontinence, or progressive voiding symptoms.   Past Medical History Problems  1. History of Dysuria (R30.0)  Surgical History Problems  1. History of Aortic Valve Replacement 2. History of Cystoscopy With Biopsy 3. History of Cystoscopy With Fulguration Medium Lesion (2-5cm) 4. History of Cystoscopy With Fulguration Small Lesion (5-24mm) 5. History of Cystoscopy With Insertion Of Ureteral Stent Right  Current Meds 1. Aspirin 81 MG Oral Tablet Chewable;  Therapy: (Recorded:07Apr2015) to Recorded 2. Atenolol 25 MG Oral Tablet;  Therapy: (Recorded:22Apr2014) to Recorded 3. Fish Oil CAPS;  Therapy: (Recorded:07Apr2015) to Recorded 4. Ibuprofen TABS;  Therapy: (Recorded:02Dec2014) to Recorded 5. Multi-Day Vitamins TABS;  Therapy: (Recorded:07Apr2015)  to Recorded 6. Pyridium 200 MG Oral Tablet; TAKE 1 TABLET 3 TIMES DAILY AFTER MEALS AS NEEDED   Requested for: 03Aug2016; Last Rx:02Aug2016 Ordered 7. Rapaflo 8 MG Oral Capsule; TAKE 1 CAPSULE Bedtime;  Therapy: [id number] to (Evaluate:06Feb2017); Last Rx:09Jan2017 Ordered 8. Uribel 118 MG Oral Capsule; Take one capsule 3 times per day as needed for bladder  pain and dysuria (burning with urination);  Therapy: [id number] to (Evaluate:18Feb2017); Last Rx:09Jan2017 Ordered 9. Zolpidem Tartrate 10 MG Oral Tablet;  Therapy: [id number] to Recorded  Allergies Medication  1. No Known Drug Allergies  Family History Problems  1. Family history of Brain Cancer : Father 2. Family history of Death In The Family Father : Father   30- brain cancer 3. Family history of Death In The Family Mother : Mother   24's - "scar tissue on brain" 4. Family history of Family Health Status Number Of Children   1 son & 1 daughter  Social History Problems  1. Denied: History of Alcohol Use (History) 2. Caffeine Use   2-3 per day 3. Former smoker [phone])   smoked 2 ppd for 20 yrs & quit in 2011 4. Marital History - Single 5. Occupation:   truck driver  Review of Systems No changes in pts bowel habits, neurological changes, or progressive lower urinary tract symptoms.      Physical Exam Constitutional: Well nourished and well developed . No acute distress.  Pulmonary: No respiratory distress and normal respiratory rhythm and effort.  Cardiovascular: Heart rate and rhythm are normal . No peripheral edema.  Abdomen: The abdomen is soft and nontender. No masses are palpated. No CVA tenderness. No hernias are palpable. No hepatosplenomegaly noted.  Lymphatics: The femoral and inguinal nodes are not enlarged or tender.  Skin: Normal skin turgor, no visible rash and no visible skin lesions.  Neuro/Psych:. Mood and affect are appropriate.    Results/Data Urine [Data Includes: Last 1 Day]    [id number]  COLOR YELLOW   APPEARANCE CLEAR   SPECIFIC GRAVITY 1.020   pH 5.5   GLUCOSE NEGATIVE   BILIRUBIN NEGATIVE   KETONE NEGATIVE   BLOOD NEGATIVE   PROTEIN NEGATIVE   NITRITE NEGATIVE   LEUKOCYTE ESTERASE NEGATIVE    Normal UA   Procedure  Procedure: Cystoscopy   Indication: History of Urothelial Carcinoma.  Informed Consent: from the patient . Specific risks including, but not limited to bleeding, infection, pain, allergic reaction etc. were explained.  Prep: The patient was prepped with hibiclens.  Anesthesia:. Local anesthesia was administered intraurethrally with 2% lidocaine jelly.  Antibiotic prophylaxis: Ciprofloxacin.  Procedure Note:  Urethral meatus:. No abnormalities.  Anterior urethra: No abnormalities.  Prostatic urethra: No abnormalities.  Bladder: Visulization was clear. The ureteral orifices were in the normal anatomic position bilaterally and had clear efflux of urine. A papillary tumor was seen in the bladder. This tumor was located on the left side, on the posterior aspect of the bladder. (Patient has a 5 mm recurrence at the site of his last 2 office based resection)    Assessment The patient has a history of high-grade non-muscle invasive bladder cancer and has had several recurrences. It appears that he is developing a recurrence again at the site of the previous resection.   Plan Dysuria, Increased urinary frequency  1. PVR U/S; Status:Hold For - Date of Service; Requested for:28Feb2017;  Health Maintenance  2. UA With REFLEX; [Do Not Release]; Status:Complete;   [id number] 08:11AM  Discussion/Summary I recommended that we proceed to the operating room to remove this area more completely. Clearly, I have been unable to effectively rid the patient of this lesion in the office. At the time of his procedure we would also perform bilateral retrograde pyelograms. I would also still postoperative mitomycin-C. This would significantly reduce his  recurrence rate. The patient is familiar with this procedure. We did discuss the risks and benefits thereof. He is concerned about having a catheter postoperatively, this is unlikely given the size and superficial appearance of the lesion in question. We'll plan to get this taken care of the patient's earliest convenience.

## 2015-10-12 NOTE — Anesthesia Preprocedure Evaluation (Signed)
Anesthesia Evaluation  Patient identified by MRN, date of birth, ID band Patient awake    Reviewed: Allergy & Precautions, H&P , NPO status , Patient's Chart, lab work & pertinent test results, reviewed documented beta blocker date and time   Airway Mallampati: II  TM Distance: >3 FB Neck ROM: Full    Dental no notable dental hx.    Pulmonary former smoker,    Pulmonary exam normal breath sounds clear to auscultation       Cardiovascular Exercise Tolerance: Good Normal cardiovascular exam+ dysrhythmias  Rhythm:Regular Rate:Normal  S/P AVR 2010, most recent Echo stable with normally functioning valve  ECG: RBBB   Neuro/Psych negative neurological ROS  negative psych ROS   GI/Hepatic negative GI ROS, Neg liver ROS,   Endo/Other  negative endocrine ROS  Renal/GU negative Renal ROS  negative genitourinary   Musculoskeletal negative musculoskeletal ROS (+)   Abdominal   Peds negative pediatric ROS (+)  Hematology negative hematology ROS (+)   Anesthesia Other Findings   Reproductive/Obstetrics negative OB ROS                             Anesthesia Physical  Anesthesia Plan  ASA: III  Anesthesia Plan: General   Post-op Pain Management:    Induction: Intravenous  Airway Management Planned: LMA  Additional Equipment:   Intra-op Plan:   Post-operative Plan: Extubation in OR  Informed Consent: I have reviewed the patients History and Physical, chart, labs and discussed the procedure including the risks, benefits and alternatives for the proposed anesthesia with the patient or authorized representative who has indicated his/her understanding and acceptance.   Dental advisory given  Plan Discussed with: CRNA  Anesthesia Plan Comments:         Anesthesia Quick Evaluation

## 2015-10-12 NOTE — Transfer of Care (Signed)
Immediate Anesthesia Transfer of Care Note  Patient: [name]  Procedure(s) Performed: Procedure(s) (LRB): BILATERAL RETROGRADE PYELOGRAM AND POST OP MITOMYCIN C (Bilateral) TRANSURETHRAL RESECTION OF BLADDER TUMOR (TURBT) (N/A)  Patient Location: PACU  Anesthesia Type: General  Level of Consciousness: awake, sedated, patient cooperative and responds to stimulation  Airway & Oxygen Therapy: Patient Spontanous Breathing and Patient connected to face mask oxygen  Post-op Assessment: Report given to PACU RN, Post -op Vital signs reviewed and stable and Patient moving all extremities  Post vital signs: Reviewed and stable  Complications: No apparent anesthesia complications

## 2015-10-12 NOTE — Discharge Instructions (Signed)
Transurethral Resection of Bladder Tumor (TURBT) or Bladder Biopsy ° ° °Definition: ° Transurethral Resection of the Bladder Tumor is a surgical procedure used to diagnose and remove tumors within the bladder. TURBT is the most common treatment for early stage bladder cancer. ° °General instructions: °   ° Your recent bladder surgery requires very little post hospital care but some definite precautions. ° °Despite the fact that no skin incisions were used, the area around the bladder incisions are raw and covered with scabs to promote healing and prevent bleeding. Certain precautions are needed to insure that the scabs are not disturbed over the next 2-4 weeks while the healing proceeds. ° °Because the raw surface inside your bladder and the irritating effects of urine you may expect frequency of urination and/or urgency (a stronger desire to urinate) and perhaps even getting up at night more often. This will usually resolve or improve slowly over the healing period. You may see some blood in your urine over the first 6 weeks. Do not be alarmed, even if the urine was clear for a while. Get off your feet and drink lots of fluids until clearing occurs. If you start to pass clots or don't improve call us. ° °Diet: ° °You may return to your normal diet immediately. Because of the raw surface of your bladder, alcohol, spicy foods, foods high in acid and drinks with caffeine may cause irritation or frequency and should be used in moderation. To keep your urine flowing freely and avoid constipation, drink plenty of fluids during the day (8-10 glasses). Tip: Avoid cranberry juice because it is very acidic. ° °Activity: ° °Your physical activity doesn't need to be restricted. However, if you are very active, you may see some blood in the urine. We suggest that you reduce your activity under the circumstances until the bleeding has stopped. ° °Bowels: ° °It is important to keep your bowels regular during the postoperative  period. Straining with bowel movements can cause bleeding. A bowel movement every other day is reasonable. Use a mild laxative if needed, such as milk of magnesia 2-3 tablespoons, or 2 Dulcolax tablets. Call if you continue to have problems. If you had been taking narcotics for pain, before, during or after your surgery, you may be constipated. Take a laxative if necessary. ° ° ° °Medication: ° °You should resume your pre-surgery medications unless told not to. In addition you may be given an antibiotic to prevent or treat infection. Antibiotics are not always necessary. All medication should be taken as prescribed until the bottles are finished unless you are having an unusual reaction to one of the drugs. ° ° ° ° ° °Post Anesthesia Home Care Instructions ° °Activity: °Get plenty of rest for the remainder of the day. A responsible adult should stay with you for 24 hours following the procedure.  °For the next 24 hours, DO NOT: °-Drive a car °-Operate machinery °-Drink alcoholic beverages °-Take any medication unless instructed by your physician °-Make any legal decisions or sign important papers. ° °Meals: °Start with liquid foods such as gelatin or soup. Progress to regular foods as tolerated. Avoid greasy, spicy, heavy foods. If nausea and/or vomiting occur, drink only clear liquids until the nausea and/or vomiting subsides. Call your physician if vomiting continues. ° °Special Instructions/Symptoms: °Your throat may feel dry or sore from the anesthesia or the breathing tube placed in your throat during surgery. If this causes discomfort, gargle with warm salt water. The discomfort should disappear within 24   hours. ° °If you had a scopolamine patch placed behind your ear for the management of post- operative nausea and/or vomiting: ° °1. The medication in the patch is effective for 72 hours, after which it should be removed.  Wrap patch in a tissue and discard in the trash. Wash hands thoroughly with soap and  water. °2. You may remove the patch earlier than 72 hours if you experience unpleasant side effects which may include dry mouth, dizziness or visual disturbances. °3. Avoid touching the patch. Wash your hands with soap and water after contact with the patch. °  ° °

## 2015-10-13 ENCOUNTER — Encounter (HOSPITAL_BASED_OUTPATIENT_CLINIC_OR_DEPARTMENT_OTHER): Payer: Self-pay | Admitting: Urology

## 2016-01-17 ENCOUNTER — Encounter (HOSPITAL_COMMUNITY): Payer: Self-pay | Admitting: Emergency Medicine

## 2016-01-17 ENCOUNTER — Emergency Department (HOSPITAL_COMMUNITY): Payer: Managed Care, Other (non HMO)

## 2016-01-17 ENCOUNTER — Emergency Department (HOSPITAL_COMMUNITY)
Admission: EM | Admit: 2016-01-17 | Discharge: 2016-01-17 | Disposition: A | Discharge status: Home / Self Care | Payer: Managed Care, Other (non HMO) | Attending: Emergency Medicine | Admitting: Emergency Medicine

## 2016-01-17 DIAGNOSIS — Y9389 Activity, other specified: Secondary | ICD-10-CM | POA: Insufficient documentation

## 2016-01-17 DIAGNOSIS — S40811A Abrasion of right upper arm, initial encounter: Secondary | ICD-10-CM | POA: Diagnosis not present

## 2016-01-17 DIAGNOSIS — Y999 Unspecified external cause status: Secondary | ICD-10-CM | POA: Diagnosis not present

## 2016-01-17 DIAGNOSIS — Z7982 Long term (current) use of aspirin: Secondary | ICD-10-CM | POA: Diagnosis not present

## 2016-01-17 DIAGNOSIS — S60511A Abrasion of right hand, initial encounter: Secondary | ICD-10-CM | POA: Insufficient documentation

## 2016-01-17 DIAGNOSIS — S3992XA Unspecified injury of lower back, initial encounter: Secondary | ICD-10-CM | POA: Diagnosis present

## 2016-01-17 DIAGNOSIS — S39012A Strain of muscle, fascia and tendon of lower back, initial encounter: Secondary | ICD-10-CM | POA: Diagnosis not present

## 2016-01-17 DIAGNOSIS — S40812A Abrasion of left upper arm, initial encounter: Secondary | ICD-10-CM | POA: Insufficient documentation

## 2016-01-17 DIAGNOSIS — Z87891 Personal history of nicotine dependence: Secondary | ICD-10-CM | POA: Diagnosis not present

## 2016-01-17 DIAGNOSIS — R52 Pain, unspecified: Secondary | ICD-10-CM

## 2016-01-17 DIAGNOSIS — W132XXA Fall from, out of or through roof, initial encounter: Secondary | ICD-10-CM | POA: Diagnosis not present

## 2016-01-17 DIAGNOSIS — W19XXXA Unspecified fall, initial encounter: Secondary | ICD-10-CM

## 2016-01-17 DIAGNOSIS — Z8551 Personal history of malignant neoplasm of bladder: Secondary | ICD-10-CM | POA: Insufficient documentation

## 2016-01-17 DIAGNOSIS — S60512A Abrasion of left hand, initial encounter: Secondary | ICD-10-CM | POA: Insufficient documentation

## 2016-01-17 DIAGNOSIS — Y929 Unspecified place or not applicable: Secondary | ICD-10-CM | POA: Insufficient documentation

## 2016-01-17 MED ORDER — MORPHINE SULFATE (PF) 4 MG/ML IV SOLN
6.0000 mg | Freq: Once | INTRAVENOUS | Status: AC
  Administered 2016-01-17: 6 mg via INTRAMUSCULAR
  Filled 2016-01-17: qty 2

## 2016-01-17 MED ORDER — TETANUS-DIPHTH-ACELL PERTUSSIS 5-2.5-18.5 LF-MCG/0.5 IM SUSP
0.5000 mL | Freq: Once | INTRAMUSCULAR | Status: AC
  Administered 2016-01-17: 0.5 mL via INTRAMUSCULAR
  Filled 2016-01-17: qty 0.5

## 2016-01-17 MED ORDER — OXYCODONE-ACETAMINOPHEN 5-325 MG PO TABS
1.0000 | ORAL_TABLET | Freq: Once | ORAL | Status: AC
  Administered 2016-01-17: 1 via ORAL
  Filled 2016-01-17: qty 1

## 2016-01-17 NOTE — ED Notes (Signed)
Family at bedside. 

## 2016-01-17 NOTE — ED Provider Notes (Signed)
MSE was initiated and I personally evaluated the patient and placed orders (if any) at  2:38 PM on January 17, 2016.  The patient appears stable so that the remainder of the MSE may be completed by another provider.  Blood pressure 125/81, pulse 77, temperature 99.2 F (37.3 C), temperature source Oral, resp. rate 20, SpO2 96 %.  [name] is a 60 y.o. male complaining of very low back pain status post mechanical fall, patient was staining the roof of his gazebo he lost his footing and fell onto lawn. He is a proximally 12 feet in the air, he was squatting down on the roof. He did not hit his feet, there was no head trauma, is not anticoagulated he has no cervicalgia, chest pain, abdominal pain states he has exacerbation of his chronic low back pain, pain is severe, exacerbated by standing. States that he tried to stand but couldn't because the pain in his low back was so severe, he crawled to a shady spot and called 911.  Patient seen and evaluated the bedside, no objective signs of head trauma, no midline C-spine tenderness palpation. Grip strength, biceps, triceps 5 out of 5. Lung sounds clear to auscultation, heart regular rate and rhythm, no tenderness to be palpation of the abdomen. Full strength and sensation to lower extremities and hip, knee and great toes. Mild tenderness to percussion of lumbar spine.   Monico Blitz, PA-C 01/17/16 [place], MD 01/17/16 1524

## 2016-01-17 NOTE — ED Notes (Signed)
Per EMS: pt fell approx 12 feet off gazebo today; pt sts landed on buttocks; pt mae and no obvious injury; pt c/o lower back pain and was unable to stand due to pain; pt with some chronic neck and back pain; pt with hx of aortic valve replacement and bladder CA in the past; pt denies LOC

## 2016-01-17 NOTE — ED Notes (Signed)
Pt ambulated to bathroom without difficulty.

## 2016-01-17 NOTE — ED Provider Notes (Signed)
CSN: [id number]     Arrival date & time 01/17/16  1416 History   First MD Initiated Contact with Patient 01/17/16 1430     Chief Complaint  Patient presents with  . Fall     (Consider location/radiation/quality/duration/timing/severity/associated sxs/prior Treatment) HPI Comments: 60 year old male with past medical history including aortic valve replacement, bladder cancer, chronic back pain who presents with back pain after a fall. Just prior to arrival, the patient was working on top of a gazebo and fell approximately 12 feet onto his bottom. He denies striking his head or losing consciousness. He states that he initially had severe low back pain in the same area that he usually has pain, just more intense than normal. He denies any extremity injury, chest wall pain, abdominal pain, extremity numbness or weakness, or difficulty breathing. No anticoagulant use. His pain has improved after receiving morphine.  Patient is a 60 y.o. male presenting with fall. The history is provided by the patient.  Fall    Past Medical History  Diagnosis Date  . RBBB   . S/P aortic valve replacement with bioprosthetic valve 05-10-2009   BY DR Ricard Dillon    CARDIOLOGIST-  DR Angelena Form  . History of aortic insufficiency   . Frequency of urination   . Nocturia   . Bladder cancer [facility])     recurrent bladder tumor    Past Surgical History  Procedure Laterality Date  . Hydrocele excision / repair  2009  . Cardiac catheterization  08-10-2008  DR MCALHANY    NO EVIDENCE CAD/ MILD GLOBAL LVSF/ MODERATE AORTIC INSUFFICIENCY/ MILD DILATION OF THE AORTIC ROOT/ EF 45-50%  . Biological bentall aortic root replacement w/ pericardial tissue valve and synthetic aortic graft  05-10-2009  DR OWENS    BICUSPID AV W/ SEVERE AI &  ANEURYSM OF AORTIC ROOT AND PROXIMAL ASCENDING THORACIC AORTA  . Transthoracic echocardiogram  07-23-2013  DR MCALHANY    mild LVH,  ef 50-55%/   NORMAL FUNCTIONING BIOPROSTHETIC AORTIC VALVE (mean  granient 75mmHg, peak gradient 5mmHg)/ 35mm prosthetic aortic root/  trivial PR/  mild RAE/  mild dilated RV  . Appendectomy  AGE 59  . Transurethral resection of bladder tumor N/A 12/31/2012    Procedure: TRANSURETHRAL RESECTION OF BLADDER TUMOR (TURBT);  Surgeon: Molli Hazard, MD;  Location: [facility];  Service: Urology;  Laterality: N/A;  . Cystoscopy w/ retrogrades Bilateral 12/31/2012    Procedure: CYSTOSCOPY WITH RETROGRADE PYELOGRAM With CYSTOGRAM AND DEEP BLADDER BIOPSY ;  Surgeon: Molli Hazard, MD;  Location: [facility];  Service: Urology;  Laterality: Bilateral;  . Cystoscopy with biopsy N/A 02/22/2013    Procedure: CYSTOSCOPY WITH BIOPSY;  Surgeon: Molli Hazard, MD;  Location: [facility];  Service: Urology;  Laterality: N/A;  . Cystoscopy with biopsy N/A 08/02/2013    Procedure: CYSTOSCOPY WITH BLADDER BIOPSY AND  BILATERAL RETROGRADES;  Surgeon: Molli Hazard, MD;  Location: [facility];  Service: Urology;  Laterality: N/A;  . Cataract extraction w/ intraocular lens implant Right 2015  . Cystoscopy w/ retrogrades Bilateral 10/12/2015    Procedure: BILATERAL RETROGRADE PYELOGRAM AND POST OP MITOMYCIN C;  Surgeon: Ardis Hughs, MD;  Location: [facility];  Service: Urology;  Laterality: Bilateral;  . Transurethral resection of bladder tumor N/A 10/12/2015    Procedure: TRANSURETHRAL RESECTION OF BLADDER TUMOR (TURBT);  Surgeon: Ardis Hughs, MD;  Location: [facility];  Service: Urology;  Laterality: N/A;  History reviewed. No pertinent family history. Social History  Substance Use Topics  . Smoking status: Former Smoker -- 2.00 packs/day for 25 years    Types: Cigarettes    Quit date: 12/30/2007  . Smokeless tobacco: Never Used  . Alcohol Use: No    Review of Systems 10 Systems reviewed and are negative for acute change except as noted in the  HPI.    Allergies  Review of patient's allergies indicates no known allergies.  Home Medications   Prior to Admission medications   Medication Sig Start Date End Date Taking? Authorizing Provider  aspirin EC 81 MG tablet Take 81 mg by mouth daily.   Yes Historical Provider, MD  atenolol (TENORMIN) 25 MG tablet Take 25 mg by mouth every morning.   Yes Historical Provider, MD  HYDROcodone-acetaminophen (NORCO/VICODIN) 5-325 MG tablet Take 1 tablet by mouth daily as needed for moderate pain.  03/03/16  Yes Historical Provider, MD  meloxicam (MOBIC) 7.5 MG tablet Take 7.5 mg by mouth daily.   Yes Historical Provider, MD  Multiple Vitamin (MULTIVITAMIN) tablet Take 1 tablet by mouth daily.    Yes Historical Provider, MD  omega-3 acid ethyl esters (LOVAZA) 1 G capsule Take 1 g by mouth daily.   Yes Historical Provider, MD  phenazopyridine (PYRIDIUM) 200 MG tablet Take 1 tablet (200 mg total) by mouth 3 (three) times daily as needed for pain. 10/12/15  Yes Ardis Hughs, MD  tamsulosin (FLOMAX) 0.4 MG CAPS capsule Take 0.4 mg by mouth at bedtime.    Yes Historical Provider, MD  traMADol (ULTRAM) 50 MG tablet Take 1-2 tablets (50-100 mg total) by mouth every 6 (six) hours as needed for moderate pain. 10/12/15  Yes Ardis Hughs, MD  zolpidem (AMBIEN) 10 MG tablet Take 10 mg by mouth at bedtime as needed for sleep.  01/02/16  Yes Historical Provider, MD   BP 120/89 mmHg  Pulse 74  Temp(Src) 99.2 F (37.3 C) (Oral)  Resp 21  SpO2 95% Physical Exam  Constitutional: He is oriented to person, place, and time. He appears well-developed and well-nourished. No distress.  Awake, alert  HENT:  Head: Normocephalic and atraumatic.  Eyes: Conjunctivae and EOM are normal. Pupils are equal, round, and reactive to light.  Neck: Normal range of motion. Neck supple.  Cardiovascular: Normal rate, regular rhythm and normal heart sounds.   No murmur heard. Pulmonary/Chest: Effort normal and breath sounds  normal. No respiratory distress. He exhibits no tenderness.  Abdominal: Soft. Bowel sounds are normal. He exhibits no distension.  Musculoskeletal: He exhibits no edema or tenderness.  No midline spinal tenderness, R and L lumbar paraspinal muscle tenderness  Neurological: He is alert and oriented to person, place, and time. He has normal reflexes. No cranial nerve deficit. He exhibits normal muscle tone.  Fluent speech, no clonus 5/5 strength and normal sensation x all 4 extremities  Skin: Skin is warm and dry.  Scattered small abrasions on arms and hands  Psychiatric: He has a normal mood and affect. Judgment and thought content normal.  Nursing note and vitals reviewed.   ED Course  Procedures (including critical care time) Labs Review Labs Reviewed - No data to display  Imaging Review Dg Chest 2 View  01/17/2016  CLINICAL DATA:  [address] 12 feet. Pain in lower back. Subsequent encounter. EXAM: CHEST  2 VIEW COMPARISON:  Chest radiograph 06/05/2009. Lumbar spine radiographs reported separately. FINDINGS: The heart is enlarged. Previous median sternotomy for aortic valve replacement. Tortuous  aorta. No active infiltrates or failure. No effusion or pneumothorax. No rib fracture. Within limits for visualization on chest radiograph, no acute thoracic vertebral body abnormality. IMPRESSION: Cardiomegaly.  Status post AVR.  No acute findings. Electronically Signed   By: Staci Righter M.D.   On: 01/17/2016 16:13   Dg Thoracic Spine 2 View  01/17/2016  CLINICAL DATA:  Pain in lower back.  Fell 12 feet. EXAM: THORACIC SPINE 2 VIEWS COMPARISON:  01/17/2016 lumbar spine and 06/05/2009 FINDINGS: Prior median sternotomy and evidence for aortic valve surgery. Mild loss of vertebral body height at T11 appears to be similar to the examination from 2010. Degenerative changes in the lower thoracic spine. Overall alignment of the thoracic spine is normal. Degenerative changes in the visualized cervical spine.  Cannot exclude some ankylosis at C2-C3. IMPRESSION: No acute bone abnormality in the thoracic spine. Electronically Signed   By: Markus Daft M.D.   On: 01/17/2016 16:07   Dg Lumbar Spine Complete  01/17/2016  CLINICAL DATA:  Mid to low back pain after falling from first story roof today. Initial encounter. EXAM: LUMBAR SPINE - COMPLETE 4+ VIEW COMPARISON:  Abdominal pelvic CT 12/05/2014. FINDINGS: There are 5 lumbar type vertebral bodies. The alignment is stable with a mild convex right scoliosis. There is multilevel spondylosis with endplate osteophytes and chronic irregularity, unchanged from previous CT. No evidence of acute fracture or pars defect. Mild aortoiliac atherosclerosis noted. IMPRESSION: No evidence of acute lumbar spine injury. Grossly stable multilevel spondylosis. Electronically Signed   By: Richardean Sale M.D.   On: 01/17/2016 15:04     EKG Interpretation None     Medications  morphine 4 MG/ML injection 6 mg (6 mg Intramuscular Given 01/17/16 1509)  oxyCODONE-acetaminophen (PERCOCET/ROXICET) 5-325 MG per tablet 1 tablet (1 tablet Oral Given 01/17/16 1638)  Tdap (BOOSTRIX) injection 0.5 mL (0.5 mLs Intramuscular Given 01/17/16 1639)     MDM   Final diagnoses:  Lumbosacral strain, initial encounter  Fall, initial encounter   Patient presents with acute on chronic low back pain after falling approximately 12 feet from to see below. No head injury or loss of consciousness. Patient well-appearing on exam with normal vital signs. No obvious signs of trauma. Obtained plain films of T and L spine as well as chest. Patient initially received morphine and later Percocet. Updated tetanus vaccination as he has a few small abrasions.  Plain films unremarkable. Patient was able to ambulate without difficulty in the ED. He was well-appearing on reexamination. As he has no obvious signs of trauma, denies any chest or abdominal pain, and has a normal neurologic exam, I feel he is safe for  discharge. Discussed supportive care and follow-up with PCP. Patient already has Norco to use at home. Return precautions reviewed and patient discharged in satisfactory condition.  Sharlett Iles, MD 01/17/16 [phone]

## 2016-05-17 ENCOUNTER — Other Ambulatory Visit: Payer: Self-pay | Admitting: Family Medicine

## 2016-05-17 DIAGNOSIS — R7989 Other specified abnormal findings of blood chemistry: Secondary | ICD-10-CM

## 2016-05-17 DIAGNOSIS — R945 Abnormal results of liver function studies: Principal | ICD-10-CM

## 2016-05-28 ENCOUNTER — Other Ambulatory Visit: Payer: Managed Care, Other (non HMO)

## 2016-05-29 ENCOUNTER — Ambulatory Visit
Admission: RE | Admit: 2016-05-29 | Discharge: 2016-05-29 | Disposition: A | Discharge status: Home / Self Care | Payer: Managed Care, Other (non HMO) | Source: Ambulatory Visit | Attending: Family Medicine | Admitting: Family Medicine

## 2016-05-29 DIAGNOSIS — R7989 Other specified abnormal findings of blood chemistry: Secondary | ICD-10-CM

## 2016-05-29 DIAGNOSIS — R945 Abnormal results of liver function studies: Principal | ICD-10-CM

## 2016-06-18 ENCOUNTER — Telehealth: Payer: Self-pay | Admitting: Cardiovascular Disease

## 2016-06-18 ENCOUNTER — Encounter: Payer: Self-pay | Admitting: Physician Assistant

## 2016-06-18 DIAGNOSIS — I351 Nonrheumatic aortic (valve) insufficiency: Secondary | ICD-10-CM | POA: Insufficient documentation

## 2016-06-18 DIAGNOSIS — I10 Essential (primary) hypertension: Secondary | ICD-10-CM | POA: Insufficient documentation

## 2016-06-18 DIAGNOSIS — Q231 Congenital insufficiency of aortic valve: Secondary | ICD-10-CM | POA: Insufficient documentation

## 2016-06-18 DIAGNOSIS — I4510 Unspecified right bundle-branch block: Secondary | ICD-10-CM | POA: Insufficient documentation

## 2016-06-18 DIAGNOSIS — Z953 Presence of xenogenic heart valve: Secondary | ICD-10-CM | POA: Insufficient documentation

## 2016-06-18 DIAGNOSIS — I428 Other cardiomyopathies: Secondary | ICD-10-CM | POA: Insufficient documentation

## 2016-06-18 NOTE — Telephone Encounter (Signed)
Pt c/o swelling: STAT is pt has developed SOB within 24 hours  1. How long have you been experiencing swelling? 5 days  2. Where is the swelling located? Ankles and feet  3.  Are you currently taking a "fluid pill"? no  4.  Are you currently SOB? Yes weeks ago  5.  Have you traveled recently? Not out of country

## 2016-06-18 NOTE — Telephone Encounter (Signed)
I spoke to patient.  Pt c/o SOB on exertion, dizziness when sitting to standing, fatigue, and leg/foot swelling.  He admits to recent travel by car to [place].  He states he was symptomatic prior to trip.  He states he saw PCP recently and everything was "fine".  He is requesting to be seen by Dr. Angelena Form. I advised Dr. Angelena Form does not have openings for a bit, made appt with Melina Copa, PA-C 11/16 @ 0830.  He denies chest pain, SOB w/o exertion.  I advised of ED precautions. He voiced understanding and agreed with plan.

## 2016-06-18 NOTE — Progress Notes (Signed)
Cardiology Office Note    Date:  06/20/2016  ID:  [name], DOB June 28, 1956, MRN [id number] PCP:  Kristopher Simmer, MD  Cardiologist:  Dr. Angelena Form   Chief Complaint: f/u aortic valve disease  History of Present Illness:  [name] is a 60 y.o. male with history of bicuspid AV/aortic insufficiency s/p bioprosthetic AVR and aortic root replacement 05/2009, NICM (previous EF 45-50% in 2009, 50-55% in 2014), RBBB, bladder CA s/p resection 2014, HTN who presents for routine follow-up. [place] 08/2008 showed no angiographic evidence of CAD, mild global LV systolic dysfunction, mod AI, mild dilation of aortic root - EF was 45-50% in 2009, with most recent echo 07/2013 showing mild LVH, EF 50-55%, normal AVR, prosthetic aortic root 94m, mildly dilated RV/RA. He brings in additional results from a recent physical with abd [place] showing hepatic parenchyma mildly heterogeneous in appearance suggesting possible diffuse hepatocellular disease, no other abnormality. Hep panels were negative. Labs ~5 weeks ago showed Na 144, K 4.9, Cl 107, CO2 25, BUN 11, Cr 1.00, AST 85, ALT 50, alk phos 52, glucose 110, albumin 4.1, Tprot 7.0, trig 58, HDL 59, LDL 81, TSH 1.53, WBC 5.3, Hgb 14, Hct 41, plt 211. He is waiting to hear back what the next course of action is. He used to drink heavily.  He presents back for follow-up to discuss shortness of breath and dizziness. For the last 6 months he has noticed decreased exercise tolerance. He stopped riding his bike regularly about 6 months ago due to this. In September he noticed he would get SOB after climbing 2 flights of stairs which was unusual. He would feel like his heart could not keep up with the level of activity he was experiencing. He has also noticed some orthostatic-type dizziness, with lightheadness that occurs when he goes from a bending over position to standing up. No chest pain or bleeding reported. [place] they walked over 9 miles in the course of a few days. He did not have any limitation while doing this. He did notice transient LEE after a long day of walking that resolved spontaneously.   Past Medical History:  Diagnosis Date  . Abnormal LFTs    a. eval in 2017 for this - [place] diffuse [place] disease.  . Aortic insufficiency    a. bicuspid AV/AI s/p bioprosthetic AVR and aortic root replacement 05/2009.  .Marland KitchenBicuspid aortic valve   . [place]    recurrent [place]   . Essential hypertension   . Former consumption of alcohol   . Frequency of urination   . History of aortic insufficiency   . NIC[place] ([place]mic cardiomyopathy) [place]    a. Previous EF 45-50% in 2009, improved to 50-55% in 2014 (no CAD by cath in 2010).  . Nocturia   . RBBB   . S/P aortic valve replacement with bioprosthetic valve 05-10-2009   BY DR ORicard Dillon  CARDIOLOGIST-  DR MAngelena Form   Past Surgical History:  Procedure Laterality Date  . APPENDECTOMY  AGE 73  . BIOLOGICAL BENTALL AORTIC ROOT REPLACEMENT W/ PERICARDIAL TISSUE VALVE AND SYNTHETIC AORTIC GRAFT  05-10-2009  DR OWENS   BICUSPID AV W/ SEVERE AI &  ANEURYSM OF AORTIC ROOT AND PROXIMAL ASCENDING THORACIC AORTA  . CARDIAC CATHETERIZATION  08-10-2008  DR MAngelena Form  NO EVIDENCE CAD/ MILD GLOBAL LVSF/ MODERATE AORTIC INSUFFICIENCY/ MILD DILATION OF THE AORTIC ROOT/ EF  45-50%  . CATARACT EXTRACTION W/ INTRAOCULAR LENS IMPLANT Right 2015  . CYSTOSCOPY W/ RETROGRADES Bilateral 12/31/2012   Procedure: CYSTOSCOPY WITH RETROGRADE PYELOGRAM With CYSTOGRAM AND DEEP BLADDER BIOPSY ;  Surgeon: Molli Hazard, MD;  Location: [facility];  Service: Urology;  Laterality: Bilateral;  . CYSTOSCOPY W/ RETROGRADES Bilateral 10/12/2015   Procedure: BILATERAL RETROGRADE PYELOGRAM AND POST OP MITOMYCIN C;  Surgeon: Ardis Hughs, MD;  Location: [facility];  Service: Urology;  Laterality: Bilateral;  .  CYSTOSCOPY WITH BIOPSY N/A 02/22/2013   Procedure: CYSTOSCOPY WITH BIOPSY;  Surgeon: Molli Hazard, MD;  Location: [facility];  Service: Urology;  Laterality: N/A;  . CYSTOSCOPY WITH BIOPSY N/A 08/02/2013   Procedure: CYSTOSCOPY WITH BLADDER BIOPSY AND  BILATERAL RETROGRADES;  Surgeon: Molli Hazard, MD;  Location: [facility];  Service: Urology;  Laterality: N/A;  . HYDROCELE EXCISION / REPAIR  2009  . TRANSTHORACIC ECHOCARDIOGRAM  07-23-2013  DR MCALHANY   mild LVH,  ef 50-55%/   NORMAL FUNCTIONING BIOPROSTHETIC AORTIC VALVE (mean granient 14mHg, peak gradient 232mg)/ 4033mrosthetic aortic root/  trivial PR/  mild RAE/  mild dilated RV  . TRANSURETHRAL RESECTION OF BLADDER TUMOR N/A 12/31/2012   Procedure: TRANSURETHRAL RESECTION OF BLADDER TUMOR (TURBT);  Surgeon: DanMolli HazardD;  [facility];  Laterality: N/A;  . TRANSURETHRAL RESECTION OF BLADDER TUMOR N/A 10/12/2015   Procedure: TRANSURETHRAL RESECTION OF BLADDER TUMOR (TURBT);  Surgeon: BenArdis HughsD;  [facility];  Laterality: N/A;    Current Medications: Current Outpatient Prescriptions  Medication Sig Dispense Refill  . aspirin EC 81 MG tablet Take 81 mg by mouth daily.    . aMarland Kitchenenolol (TENORMIN) 25 MG tablet Take 0.5 tablets (12.5 mg total) by mouth every morning. 45 tablet 3  . HYDROcodone-acetaminophen (NORCO/VICODIN) 5-325 MG tablet Take 1 tablet by mouth daily as needed for moderate pain.     . meloxicam (MOBIC) 7.5 MG tablet Take 7.5 mg by mouth daily.    . Multiple Vitamin (MULTIVITAMIN) tablet Take 1 tablet by mouth daily.     . oMarland [place]-3 acid ethyl esters (LOVAZA) 1 G capsule Take 1 g by mouth daily.    . tamsulosin (FLOMAX) 0.4 MG CAPS capsule Take 0.4 mg by mouth 2 (two) times daily.     . zMarland Kitchenlpidem (AMBIEN) 10 MG tablet Take 10 mg by mouth at bedtime as needed for sleep.        No current facility-administered medications for this visit.      Allergies:   Patient has no known allergies.   Social History   Social History  . Marital status: Single    Spouse name: N/A  . Number of children: N/A  . Years of education: N/A   Social History Main Topics  . Smoking status: Former Smoker    Packs/day: 2.00    Years: 25.00    Types: Cigarettes    Quit date: 12/30/2007  . Smokeless tobacco: Never Used  . Alcohol use No  . Drug use: No  . Sexual activity: Not Asked   Other Topics Concern  . None   Social History Narrative   Tobacco use 1 1/2 ppd for 35 years- stopped 2010   No alcohol    No illicit drugs   Single    2 children     Family History:  The patient's family history includes  Mitral valve prolapse in his father.   ROS:   Please see the history of present illness.  All other systems are reviewed and otherwise negative.    PHYSICAL EXAM:   VS:  BP 120/80   Pulse 64   Ht 6' (1.829 m)   Wt 188 lb 12.8 oz (85.6 kg)   SpO2 99%   BMI 25.61 kg/m   BMI: Body mass index is 25.61 kg/m. GEN: Well nourished, well developed WM in no acute distress  HEENT: normocephalic, atraumatic Neck: no JVD, carotid bruits, or masses Cardiac: RRR; crisp valve sound without murmurs, rubs, or gallops, no edema  Respiratory:  clear to auscultation bilaterally, normal work of breathing GI: soft, nontender, nondistended, + BS MS: no deformity or atrophy  Skin: warm and dry, no rash Neuro:  Alert and Oriented x 3, Strength and sensation are intact, follows commands Psych: euthymic mood, full affect  Wt Readings from Last 3 Encounters:  06/20/16 188 lb 12.8 oz (85.6 kg)  10/12/15 193 lb (87.5 kg)  09/18/15 200 lb (90.7 kg)      Studies/Labs Reviewed:   EKG:  EKG was ordered today and personally reviewed by me and demonstrates NSR 65bpm, RBBB, no acute change from prior.  Recent Labs: 10/12/2015: BUN 21; Creatinine, Ser 0.80; Hemoglobin 16.0; Potassium  4.2; Sodium 142   Additional studies/ records that were reviewed today include: Summarized above.    ASSESSMENT & PLAN:   1. Dyspnea/dizziness - some of his dyspnea sounds suggestive of chronotropic incompetence. Vital signs are normal. He had no CAD on cath in 2010. He is not tachycardic, tachypneic or hypoxic and has no sign of edema on exam. Recent labs were unremarkable (except elevated LFTs which are being evaluated per primary care). He is also describing symptoms compatible with orthostasis. Will decrease atenolol to 12.'5mg'$  daily and follow symptoms. If these persist would consider discontinuation of beta blocker altogether. Will update 2D echocardiogram to ensure no structural change. May need to consider ETT to document HR excursion. 2. Bicuspid AV/aortic insufficiency s/p AVR and aortic root replacement 2010 - f/u echocardiogram. 3. NICM - no evidence of HF on exam today. Suspect some of the swelling he had after walking several miles in [place] was related to vasodilation and dependent edema. This has resolved and has not recurred. 4. RBBB - chronic. 5. Essential HTN - follow BP with med change.  Disposition: F/u with me 2 weeks after echocardiogram.   Medication Adjustments/Labs and Tests Ordered: Current medicines are reviewed at length with the patient today.  Concerns regarding medicines are outlined above. Medication changes, Labs and Tests ordered today are summarized above and listed in the Patient Instructions accessible in Encounters.   Raechel Ache PA-C  06/20/2016 9:08 AM    [facility] [place], [place],   [address] Phone: [phone]; Fax: [phone]

## 2016-06-20 ENCOUNTER — Encounter: Payer: Self-pay | Admitting: Physician Assistant

## 2016-06-20 ENCOUNTER — Ambulatory Visit (INDEPENDENT_AMBULATORY_CARE_PROVIDER_SITE_OTHER): Payer: Managed Care, Other (non HMO) | Admitting: Physician Assistant

## 2016-06-20 ENCOUNTER — Encounter (INDEPENDENT_AMBULATORY_CARE_PROVIDER_SITE_OTHER): Payer: Self-pay

## 2016-06-20 VITALS — BP 120/80 | HR 64 | Ht 72.0 in | Wt 188.8 lb

## 2016-06-20 DIAGNOSIS — I351 Nonrheumatic aortic (valve) insufficiency: Secondary | ICD-10-CM | POA: Diagnosis not present

## 2016-06-20 DIAGNOSIS — I428 Other cardiomyopathies: Secondary | ICD-10-CM | POA: Diagnosis not present

## 2016-06-20 DIAGNOSIS — Q231 Congenital insufficiency of aortic valve: Secondary | ICD-10-CM

## 2016-06-20 DIAGNOSIS — I10 Essential (primary) hypertension: Secondary | ICD-10-CM

## 2016-06-20 DIAGNOSIS — Q2381 Bicuspid aortic valve: Secondary | ICD-10-CM

## 2016-06-20 DIAGNOSIS — R0602 Shortness of breath: Secondary | ICD-10-CM

## 2016-06-20 DIAGNOSIS — I4510 Unspecified right bundle-branch block: Secondary | ICD-10-CM

## 2016-06-20 DIAGNOSIS — Z953 Presence of xenogenic heart valve: Secondary | ICD-10-CM | POA: Diagnosis not present

## 2016-06-20 MED ORDER — ATENOLOL 25 MG PO TABS: 12.5000 mg | ORAL_TABLET | Freq: Every morning | ORAL | 3 refills | Status: DC

## 2016-06-20 NOTE — Patient Instructions (Addendum)
Medication Instructions:  Your physician has recommended you make the following change in your medication:  1.  DECREASE the Atenolol to 25 mg taking 1/2 tablet daily   Labwork: None ordered  Testing/Procedures: Your physician has requested that you have an echocardiogram. Echocardiography is a painless test that uses sound waves to create images of your heart. It provides your doctor with information about the size and shape of your heart and how well your heart's chambers and valves are working. This procedure takes approximately one hour. There are no restrictions for this procedure.   Follow-Up: Your physician recommends that you schedule a follow-up appointment in: 2 WEEKS AFTER HIS ECHOCARDIOGRAM WITH DAYNA DUNN, PA-C    Any Other Special Instructions Will Be Listed Below (If Applicable).  Echocardiogram An echocardiogram, or echocardiography, uses sound waves (ultrasound) to produce an image of your heart. The echocardiogram is simple, painless, obtained within a short period of time, and offers valuable information to your health care provider. The images from an echocardiogram can provide information such as:  Evidence of coronary artery disease (CAD).  Heart size.  Heart muscle function.  Heart valve function.  Aneurysm detection.  Evidence of a past heart attack.  Fluid buildup around the heart.  Heart muscle thickening.  Assess heart valve function. Tell a health care provider about:  Any allergies you have.  All medicines you are taking, including vitamins, herbs, eye drops, creams, and over-the-counter medicines.  Any problems you or family members have had with anesthetic medicines.  Any blood disorders you have.  Any surgeries you have had.  Any medical conditions you have.  Whether you are pregnant or may be pregnant. What happens before the procedure? No special preparation is needed. Eat and drink normally. What happens during the  procedure?  In order to produce an image of your heart, gel will be applied to your chest and a wand-like tool (transducer) will be moved over your chest. The gel will help transmit the sound waves from the transducer. The sound waves will harmlessly bounce off your heart to allow the heart images to be captured in real-time motion. These images will then be recorded.  You may need an IV to receive a medicine that improves the quality of the pictures. What happens after the procedure? You may return to your normal schedule including diet, activities, and medicines, unless your health care provider tells you otherwise. This information is not intended to replace advice given to you by your health care provider. Make sure you discuss any questions you have with your health care provider. Document Released: 07/19/2000 Document Revised: 03/09/2016 Document Reviewed: 03/29/2013 Elsevier Interactive Patient Education  2017 [employer].  If you need a refill on your cardiac medications before your next appointment, please call your pharmacy.

## 2016-07-10 ENCOUNTER — Ambulatory Visit (HOSPITAL_COMMUNITY): Payer: Managed Care, Other (non HMO) | Attending: Cardiology

## 2016-07-10 ENCOUNTER — Other Ambulatory Visit: Payer: Self-pay

## 2016-07-10 DIAGNOSIS — R0602 Shortness of breath: Secondary | ICD-10-CM | POA: Diagnosis present

## 2016-07-10 DIAGNOSIS — Z87891 Personal history of nicotine dependence: Secondary | ICD-10-CM | POA: Insufficient documentation

## 2016-07-10 DIAGNOSIS — Z9889 Other specified postprocedural states: Secondary | ICD-10-CM | POA: Insufficient documentation

## 2016-07-10 DIAGNOSIS — R42 Dizziness and giddiness: Secondary | ICD-10-CM | POA: Diagnosis not present

## 2016-07-10 DIAGNOSIS — I77819 Aortic ectasia, unspecified site: Secondary | ICD-10-CM | POA: Insufficient documentation

## 2016-07-10 DIAGNOSIS — C679 Malignant neoplasm of bladder, unspecified: Secondary | ICD-10-CM | POA: Diagnosis not present

## 2016-07-10 DIAGNOSIS — I429 Cardiomyopathy, unspecified: Secondary | ICD-10-CM | POA: Diagnosis not present

## 2016-07-16 ENCOUNTER — Telehealth: Payer: Self-pay | Admitting: Physician Assistant

## 2016-07-16 NOTE — Telephone Encounter (Signed)
-----   Message from Charlie Pitter, [place] sent at 07/12/2016  2:10 PM EST ----- Please let patient know I have requested the reading cardiologist try to obtain records of his old echo images to compare this most recent one to. His heart squeeze function appears decreased from the last echo in 2014, but similar to all the echoes before then. His echo in 2014 was the only one that showed EF closer to 50%. The valve appears to be functioning normally. Will get back with him as soon as I hear back from Dr. Aundra Dubin. Dayna Dunn PA-C

## 2016-07-16 NOTE — Telephone Encounter (Signed)
New message ° ° ° ° ° °Returning a call to the nurse to get echo results °

## 2016-07-16 NOTE — Telephone Encounter (Signed)
Returned pts call and went over the echo results.

## 2016-07-18 ENCOUNTER — Telehealth: Payer: Self-pay | Admitting: Cardiovascular Disease

## 2016-07-18 ENCOUNTER — Encounter: Payer: Self-pay | Admitting: Physician Assistant

## 2016-07-18 NOTE — Telephone Encounter (Signed)
[name] is returning a call . Thanks

## 2016-07-18 NOTE — Telephone Encounter (Signed)
Returned pts call.  See result note under labs.

## 2016-07-29 ENCOUNTER — Encounter: Payer: Self-pay | Admitting: Physician Assistant

## 2016-07-29 NOTE — Progress Notes (Signed)
Cardiology Office Note    Date:  07/30/2016  ID:  [name], DOB [birth year], MRN [id number] PCP:  Kristopher Simmer, MD  Cardiologist:  Kristopher. Angelena Cline   Chief Complaint: f/u SOB, dizziness  History of Present Illness:  [name] is a 60 y.o. male with history of bicuspid AV/aortic insufficiency s/p bioprosthetic AVR and aortic root replacement 05/2009, NICM (previous EF 45-50% in 2009, 50-55% in 2014), RBBB, bladder CA s/p resection 2014, HTN, ?diffuse hepatocellular disease, prior heavy alcohol use who presents for routine follow-up. [place] 08/2008 showed no angiographic evidence of CAD, mild global LV systolic dysfunction, mod AI, mild dilation of aortic root - EF was 45-50% in 2009, with f/u 07/2013 showing mild LVH, EF 50-55%, normal AVR, prosthetic aortic root 85m, mildly dilated RV/RA (however, see below about EF being possibly lower). Of note he also underwent abd [place]25[place]17 showing hepatic parenchyma mildly heterogeneous in appearance suggesting possible diffuse hepatocellular disease, no other abnormality. Hep panels were negative - this is pending further workup by PCP. Labs in October  2017 showed Na 144, K 4.9, Cl 107, CO2 25, BUN 11, Cr 1.00, AST 85, ALT 50, alk phos 52, glucose 110, albumin 4.1, Tprot 7.0, trig 58, HDL 59, LDL 81, TSH 1.53, WBC 5.3, Hgb 14, Hct 41, plt 211.   He was seen in 06/2016 for SOB for the last 6 months. He had stopped riding his bike as he would feel like his heart could not keep up with the level of activity he was experiencing. He has also noticed some orthostatic-type dizziness upon standing. However, he had been able to walk 9 [place] Some of his dyspnea was suspected to be due to chronotropic incompetence. His atenolol was decreased. 2D echo 07/10/16: EF 40-45%, grade 1 DD, bioprosthetic valve functioning normally, mildly dilated ascending aorta (446maortic root, 3812mscending  aortic diameter), mild-mod dilated RV, RV systolic function mildly reduced, mildly dilated RA - I had Kristopher. McLAundra Dubineading physician) view his echo from 2014 since most other EF assessments were in the range of this recent echo, and he agreed that the echo in 2014 also had a lower EF than reported (i.e. lower than the 50-55% stated), and he felt that this most recent EF was slightly lower than prior but not markedly so.  The patient returns for follow-up. His episodes of dizziness and SOB are less frequent since decreasing the atenolol, but do still occur intermittently. He has noticed SOB out of proportion to the level of activity more than 1x a week but it occurs inconsistently. For example, sometimes he will get dyspnea working out in the yard or cleaning out the cat boxes, but on other occasions has also been able to slowly re-incorporate indoor bike riding sometimes without any limitation at all. His friend Kristopher Hammanscompanies him today and says she's worried that he does not always notice that he's SOB. He does have occasional SOB when lying down as well. No palpitations, BRBPR, melena, chest pain or edema. He does not eat excess salt. He has 30 yr hx of tobacco use as well as a history of diesel fuel exposure.   Past Medical History:  Diagnosis Date  . Abnormal LFTs    a. eval in 2017 for this - abd us Koreath diffuse hepatocellular disease.  . Aortic insufficiency    a. bicuspid AV/AI s/p bioprosthetic AVR and aortic root replacement 05/2009.  .[name]  Bicuspid aortic valve   . Bladder cancer ([place])    recurrent bladder tumor   . Dilated aortic root ([place])   . Essential hypertension   . Former consumption of alcohol   . Frequency of urination   . Hepatocellular dysfunction   . History of aortic insufficiency   . NICM (nonischemic cardiomyopathy) ([place])    a. Previous EF 45-50% in 2009 - echo in 2014 reported EF 50-55% but upon review felt to be lower - f/u echo 07/2016 with EF 40-45%. (no CAD by cath  in 2010).  . Nocturia   . RBBB   . S/P aortic valve replacement with bioprosthetic valve 05-10-2009   BY [name]   CARDIOLOGIST-  Kristopher [name]    Past Surgical History:  Procedure Laterality Date  . APPENDECTOMY  AGE 56  . BIOLOGICAL BENTALL AORTIC ROOT REPLACEMENT W/ PERICARDIAL TISSUE VALVE AND SYNTHETIC AORTIC GRAFT  05-10-2009  [name]   BICUSPID AV W/ SEVERE AI &  ANEURYSM OF AORTIC ROOT AND PROXIMAL ASCENDING THORACIC AORTA  . CARDIAC CATHETERIZATION  08-10-2008  Kristopher [name]   NO EVIDENCE CAD/ MILD GLOBAL LVSF/ MODERATE AORTIC INSUFFICIENCY/ MILD DILATION OF THE AORTIC ROOT/ EF 45-50%  . CATARACT EXTRACTION W/ INTRAOCULAR LENS IMPLANT Right 2015  . CYSTOSCOPY W/ RETROGRADES Bilateral 12/31/2012   Procedure: CYSTOSCOPY WITH RETROGRADE PYELOGRAM With CYSTOGRAM AND DEEP BLADDER BIOPSY ;  Surgeon: Kristopher Hazard, MD;  Location: [facility];  Service: Urology;  Laterality: Bilateral;  . CYSTOSCOPY W/ RETROGRADES Bilateral 10/12/2015   Procedure: BILATERAL RETROGRADE PYELOGRAM AND POST OP MITOMYCIN C;  Surgeon: Kristopher Hughs, MD;  Location[facility];  Service: Urology;  Laterality: Bilateral;  . CYSTOSCOPY WITH BIOPSY N/A 02/22/2013   Procedure: CYSTOSCOPY WITH BIOPSY;  Surgeon: Kristopher Hazard, MD;  Location: [facility];  Service: Urology;  Laterality: N/A;  . CYSTOSCOPY WITH BIOPSY N/A 08/02/2013   Procedure: CYSTOSCOPY WITH BLADDER BIOPSY AND  BILATERAL RETROGRADES;  Surgeon: Kristopher Hazard, MD;  Location: [facility];  Service: Urology;  Laterality: N/A;  . HYDROCELE EXCISION / REPAIR  2009  . TRANSTHORACIC ECHOCARDIOGRAM  07-23-2013  Kristopher [name]   mild LVH,  ef 50-55%/   NORMAL FUNCTIONING BIOPROSTHETIC AORTIC VALVE (mean granient 63mHg, peak gradient 251mg)/ 4040mrosthetic aortic root/  trivial PR/  mild RAE/  mild dilated RV  . TRANSURETHRAL RESECTION OF BLADDER TUMOR N/A 12/31/2012   Procedure:  TRANSURETHRAL RESECTION OF BLADDER TUMOR (TURBT);  Surgeon: [name];  [facility];  Laterality: N/A;  . TRANSURETHRAL RESECTION OF BLADDER TUMOR N/A 10/12/2015   Procedure: TRANSURETHRAL RESECTION OF BLADDER TUMOR (TURBT);  Surgeon: BenArdis HughsD;  [facility];  Laterality: N/A;    Current Medications: Current Outpatient Prescriptions  Medication Sig Dispense Refill  . aspirin EC 81 MG tablet Take 81 mg by mouth daily.    . aMarland Kitchenenolol (TENORMIN) 25 MG tablet Take 0.5 tablets (12.5 mg total) by mouth every morning. 45 tablet 3  . HYDROcodone-acetaminophen (NORCO/VICODIN) 5-325 MG tablet Take 1 tablet by mouth daily as needed for moderate pain.     . meloxicam (MOBIC) 7.5 MG tablet Take 7.5 mg by mouth daily.    . Multiple Vitamin (MULTIVITAMIN) tablet Take 1 tablet by mouth daily.     . oMarland Kit[place]-3 acid ethyl esters (LOVAZA) 1 G capsule Take 1 g by mouth daily.    .[name]  tamsulosin (FLOMAX) 0.4 MG CAPS capsule Take 0.4 mg by mouth 2 (two) times daily.     [name] zolpidem (AMBIEN) 10 MG tablet Take 10 mg by mouth at bedtime as needed for sleep.      No current facility-administered medications for this visit.      Allergies:   Patient has no known allergies.   Social History   Social History  . Marital status: Single    Spouse name: N/A  . Number of children: N/A  . Years of education: N/A   Social History Main Topics  . Smoking status: Former Smoker    Packs/day: 2.00    Years: 25.00    Types: Cigarettes    Quit date: 12/30/2007  . Smokeless tobacco: Never Used  . Alcohol use No  . Drug use: No  . Sexual activity: Not Asked   Other Topics Concern  . None   Social History Narrative   Tobacco use 1 1/2 ppd for 35 years- stopped 2010   No alcohol    No illicit drugs   Single    2 children     Family History:  The patient's family history includes Mitral valve prolapse in his  father.   ROS:   Please see the history of present illness.  All other systems are reviewed and otherwise negative.    PHYSICAL EXAM:   VS:  BP 130/84   Pulse 67   Ht 6' (1.829 m)   Wt 195 lb 6.4 oz (88.6 kg)   BMI 26.50 kg/m   BMI: Body mass index is 26.5 kg/m. GEN: Well nourished, well developed WM, in no acute distress  HEENT: normocephalic, atraumatic Neck: no JVD, carotid bruits, or masses Cardiac: RRR; no murmurs, rubs, or gallops, no edema  Respiratory:  Diffusely diminished, normal work of breathing GI: soft, nontender, nondistended, + BS MS: no deformity or atrophy  Skin: warm and dry, no rash Neuro:  Alert and Oriented x 3, Strength and sensation are intact, follows commands Psych: euthymic mood, full affect  Wt Readings from Last 3 Encounters:  07/30/16 195 lb 6.4 oz (88.6 kg)  06/20/16 188 lb 12.8 oz (85.6 kg)  10/12/15 193 lb (87.5 kg)      Studies/Labs Reviewed:   EKG:  EKG was ordered today and personally reviewed by me and demonstrates NSR 67bpm, RBBB, nonspecific ST-T changes.  Recent Labs: 10/12/2015: BUN 21; Creatinine, Ser 0.80; Hemoglobin 16.0; Potassium 4.2; Sodium 142   Lipid Panel No results found for: CHOL, TRIG, HDL, CHOLHDL, VLDL, LDLCALC, LDLDIRECT  Additional studies/ records that were reviewed today include: Summarized above.    ASSESSMENT & PLAN:   1. Dizziness/SOB - symptoms remain somewhat atypical. His most recent echo showed a mild drop in EF compared to prior. (Per review with Kristopher. Aundra Dubin, the EF in 2014 was overestimated, and this most recent echo is only slightly lower than prior.) I remain suspicious of a component of chronotropic incompetence related to his beta blocker based on his description of symptoms. It has also been quite some time since his last ischemic evaluation. Will proceed with ETT-nuc to assess both functional capacity and heart rate excursion as well as perfusion. Since he did see improvement in symptoms with  decreasing beta blocker, will have him stop this completely in the interim to see if it helps further ease symptoms. Unfortunately this leaves him without significant medical therapy for his cardiomyopathy but he is also experiencing frequent symptoms of orthostasis which limits ability for  aggressive med titration at this time. Will also update labs and 2V CXR. If cardiac workup is ultimately unrevealing and dyspnea persists may need to consider pulm eval given hx of tobacco abuse and diesel fuel exposure. 2. Bicuspid AV/aortic insufficiency s/p AVR and aortic root replacement 2010 - valve functioning well by recent echo. Consider yearly surveillance of aortic root/ascending aortic dimensions. 3. NICM - reviewed low sodium diet, 2L fluid restriction, daily weights will patient. Does not appear significantly volume overloaded today. F/u CXR, BNP. 4. Essential HTN with recent dizziness suggestive of orthostasis - see above. Follow off atenolol.  Disposition: F/u with Kristopher. Angelena Cline in 2-3 months.   Medication Adjustments/Labs and Tests Ordered: Current medicines are reviewed at length with the patient today.  Concerns regarding medicines are outlined above. Medication changes, Labs and Tests ordered today are summarized above and listed in the Patient Instructions accessible in Encounters.   Raechel Ache PA-C  07/30/2016 8:15 AM    [facility] [address], [place], [place]  [address] Phone: [phone]; Fax: [phone]

## 2016-07-30 ENCOUNTER — Encounter (INDEPENDENT_AMBULATORY_CARE_PROVIDER_SITE_OTHER): Payer: Self-pay

## 2016-07-30 ENCOUNTER — Encounter: Payer: Self-pay | Admitting: Physician Assistant

## 2016-07-30 ENCOUNTER — Ambulatory Visit (INDEPENDENT_AMBULATORY_CARE_PROVIDER_SITE_OTHER): Payer: Managed Care, Other (non HMO) | Admitting: Physician Assistant

## 2016-07-30 ENCOUNTER — Ambulatory Visit
Admission: RE | Admit: 2016-07-30 | Discharge: 2016-07-30 | Disposition: A | Discharge status: Home / Self Care | Payer: Managed Care, Other (non HMO) | Source: Ambulatory Visit | Attending: Physician Assistant | Admitting: Physician Assistant

## 2016-07-30 VITALS — BP 130/84 | HR 67 | Ht 72.0 in | Wt 195.4 lb

## 2016-07-30 DIAGNOSIS — I10 Essential (primary) hypertension: Secondary | ICD-10-CM

## 2016-07-30 DIAGNOSIS — R42 Dizziness and giddiness: Secondary | ICD-10-CM

## 2016-07-30 DIAGNOSIS — R0602 Shortness of breath: Secondary | ICD-10-CM | POA: Diagnosis not present

## 2016-07-30 DIAGNOSIS — I428 Other cardiomyopathies: Secondary | ICD-10-CM | POA: Diagnosis not present

## 2016-07-30 DIAGNOSIS — I359 Nonrheumatic aortic valve disorder, unspecified: Secondary | ICD-10-CM | POA: Diagnosis not present

## 2016-07-30 LAB — CBC
HEMATOCRIT: 43.1 % (ref 38.5–50.0)
Hemoglobin: 14.3 g/dL (ref 13.2–17.1)
MCH: 31.8 pg (ref 27.0–33.0)
MCHC: 33.2 g/dL (ref 32.0–36.0)
MCV: 96 fL (ref 80.0–100.0)
MPV: 9.5 fL (ref 7.5–12.5)
PLATELETS: 224 10*3/uL (ref 140–400)
RBC: 4.49 MIL/uL (ref 4.20–5.80)
RDW: 13.3 % (ref 11.0–15.0)
WBC: 8.8 10*3/uL (ref 3.8–10.8)

## 2016-07-30 LAB — BASIC METABOLIC PANEL
BUN: 18 mg/dL (ref 7–25)
CO2: 26 mmol/L (ref 20–31)
CREATININE: 0.96 mg/dL (ref 0.70–1.25)
Calcium: 9.3 mg/dL (ref 8.6–10.3)
Chloride: 106 mmol/L (ref 98–110)
Glucose, Bld: 88 mg/dL (ref 65–99)
POTASSIUM: 4.3 mmol/L (ref 3.5–5.3)
Sodium: 140 mmol/L (ref 135–146)

## 2016-07-30 LAB — BRAIN NATRIURETIC PEPTIDE: Brain Natriuretic Peptide: 70.9 pg/mL (ref <100)

## 2016-07-30 NOTE — Addendum Note (Signed)
Addended by: Eulis Foster on: 07/30/2016 09:03 AM   Modules accepted: Orders

## 2016-07-30 NOTE — Patient Instructions (Addendum)
Medication Instructions:  Your physician has recommended you make the following change in your medication:  1.  STOP the Atenolol   Labwork: TODAY:  BNP, BMET, & CBC  Testing/Procedures: A chest x-ray takes a picture of the organs and structures inside the chest, including the heart, lungs, and blood vessels. This test can show several things, including, whether the heart is enlarges; whether fluid is building up in the lungs; and whether pacemaker / defibrillator leads are still in place.  Your physician has requested that you have en exercise stress myoview. For further information please visit [email or web address]. Please follow instruction sheet, as given.   Follow-Up: Your physician recommends that you schedule a follow-up appointment in: 2-3 MONTHS WITH DR. Angelena Form   Any Other Special Instructions Will Be Listed Below (If Applicable).  X-rays X-rays are tests that create pictures of the inside of your body using radiation. Different body parts absorb different amounts of radiation, which show up on the X-ray pictures in shades of black, gray, and white. X-rays are used to look for many health conditions, including broken bones, lung problems, and some types of cancer. Tell a health care provider about:  Any allergies you have.  All medicines you are taking, including vitamins, herbs, eye drops, creams, and over-the-counter medicines.  Previous surgeries you have had.  Medical conditions you have. What are the risks? Getting an X-ray is a safe procedure. What happens before the procedure?  Tell the X-ray technician if you are pregnant or might be pregnant.  You may be asked to wear a protective lead apron to hide parts of your body from the X-ray.  You usually will need to undress whatever part of your body needs the X-ray. You will be given a hospital gown to wear, if needed.  You may need to remove your glasses, jewelry, and other metal objects. What happens during  the procedure?  The X-ray machine creates a picture by using a tiny burst of radiation. It is painless.  You may need to have several pictures taken at different angles.  You will need to try to be as still as you can during the examination to get the best possible images. What happens after the procedure?  You will be able to resume your normal activities.  The X-ray will be examined by your health care provider or a radiology specialist.  It is your responsibility to get your test results. Ask your health care provider when to expect your results and how to get them. This information is not intended to replace advice given to you by your health care provider. Make sure you discuss any questions you have with your health care provider. Document Released: 07/22/2005 Document Revised: 03/21/2016 Document Reviewed: 09/15/2013 Elsevier Interactive Patient Education  2017 [employer].   Exercise Stress Electrocardiogram An exercise stress electrocardiogram is a test that is done to evaluate the blood supply to your heart. This test may also be called exercise stress electrocardiography. The test is done while you are walking on a treadmill. The goal of this test is to raise your heart rate. This test is done to find areas of poor blood flow to the heart by determining the extent of coronary artery disease (CAD). CAD is defined as narrowing in one or more heart (coronary) arteries of more than 70%. If you have an abnormal test result, this may mean that you are not getting adequate blood flow to your heart during exercise. Additional testing may be needed to understand  why your test was abnormal. Tell a health care provider about:  Any allergies you have.  All medicines you are taking, including vitamins, herbs, eye drops, creams, and over-the-counter medicines.  Any problems you or family members have had with anesthetic medicines.  Any blood disorders you have.  Any surgeries you have  had.  Any medical conditions you have.  Possibility of pregnancy, if this applies. What are the risks? Generally, this is a safe procedure. However, as with any procedure, complications can occur. Possible complications can include:  Pain or pressure in the following areas:  Chest.  Jaw or neck.  Between your shoulder blades.  Radiating down your left arm.  Dizziness or light-headedness.  Shortness of breath.  Increased or irregular heartbeats.  Nausea or vomiting.  Heart attack (rare). What happens before the procedure?  Avoid all forms of caffeine 24 hours before your test or as directed by your health care provider. This includes coffee, tea (even decaffeinated tea), caffeinated sodas, chocolate, cocoa, and certain pain medicines.  Follow your health care provider's instructions regarding eating and drinking before the test.  Take your medicines as directed at regular times with water unless instructed otherwise. Exceptions may include:  If you have diabetes, ask how you are to take your insulin or pills. It is common to adjust insulin dosing the morning of the test.  If you are taking beta-blocker medicines, it is important to talk to your health care provider about these medicines well before the date of your test. Taking beta-blocker medicines may interfere with the test. In some cases, these medicines need to be changed or stopped 24 hours or more before the test.  If you wear a nitroglycerin patch, it may need to be removed prior to the test. Ask your health care provider if the patch should be removed before the test.  If you use an inhaler for any breathing condition, bring it with you to the test.  If you are an outpatient, bring a snack so you can eat right after the stress phase of the test.  Do not smoke for 4 hours prior to the test or as directed by your health care provider.  Do not apply lotions, powders, creams, or oils on your chest prior to the  test.  Wear loose-fitting clothes and comfortable shoes for the test. This test involves walking on a treadmill. What happens during the procedure?  Multiple patches (electrodes) will be put on your chest. If needed, small areas of your chest may have to be shaved to get better contact with the electrodes. Once the electrodes are attached to your body, multiple wires will be attached to the electrodes and your heart rate will be monitored.  Your heart will be monitored both at rest and while exercising.  You will walk on a treadmill. The treadmill will be started at a slow pace. The treadmill speed and incline will gradually be increased to raise your heart rate. What happens after the procedure?  Your heart rate and blood pressure will be monitored after the test.  You may return to your normal schedule including diet, activities, and medicines, unless your health care provider tells you otherwise. This information is not intended to replace advice given to you by your health care provider. Make sure you discuss any questions you have with your health care provider. Document Released: 07/19/2000 Document Revised: 12/28/2015 Document Reviewed: 03/29/2013 Elsevier Interactive Patient Education  2017 [employer].   If you need a refill  on your cardiac medications before your next appointment, please call your pharmacy.

## 2016-08-01 ENCOUNTER — Telehealth (HOSPITAL_COMMUNITY): Payer: Self-pay | Admitting: *Deleted

## 2016-08-01 NOTE — Telephone Encounter (Signed)
Left message on voicemail per DPR in reference to upcoming appointment scheduled on 08/07/16 at 7:45. with detailed instructions given per Myocardial Perfusion Study Information Sheet for the test. LM to arrive 15 minutes early, and that it is imperative to arrive on time for appointment to keep from having the test rescheduled. If you need to cancel or reschedule your appointment, please call the office within 24 hours of your appointment. Failure to do so may result in a cancellation of your appointment, and a $50 no show fee. Phone number given for call back for any questions. Veronia Beets

## 2016-08-07 ENCOUNTER — Ambulatory Visit (HOSPITAL_COMMUNITY): Payer: Managed Care, Other (non HMO) | Attending: Cardiovascular Disease

## 2016-08-07 DIAGNOSIS — I4510 Unspecified right bundle-branch block: Secondary | ICD-10-CM | POA: Insufficient documentation

## 2016-08-07 DIAGNOSIS — I499 Cardiac arrhythmia, unspecified: Secondary | ICD-10-CM | POA: Diagnosis not present

## 2016-08-07 DIAGNOSIS — R0602 Shortness of breath: Secondary | ICD-10-CM | POA: Diagnosis present

## 2016-08-07 DIAGNOSIS — I459 Conduction disorder, unspecified: Secondary | ICD-10-CM | POA: Diagnosis not present

## 2016-08-07 DIAGNOSIS — I10 Essential (primary) hypertension: Secondary | ICD-10-CM | POA: Diagnosis not present

## 2016-08-07 DIAGNOSIS — I429 Cardiomyopathy, unspecified: Secondary | ICD-10-CM | POA: Insufficient documentation

## 2016-08-07 DIAGNOSIS — I4430 Unspecified atrioventricular block: Secondary | ICD-10-CM | POA: Diagnosis not present

## 2016-08-07 LAB — MYOCARDIAL PERFUSION IMAGING
CHL CUP MPHR: 158 {beats}/min
CHL CUP NUCLEAR SDS: 0
CHL CUP NUCLEAR SRS: 5
CHL CUP NUCLEAR SSS: 5
CSEPEDS: 31 s
CSEPEW: 5.2 METS
CSEPPHR: 148 {beats}/min
Exercise duration (min): 3 min
LHR: 0.27
Percent HR: 94 %
RPE: 19
Rest HR: 59 {beats}/min
TID: 1.05

## 2016-08-07 MED ORDER — TECHNETIUM TC 99M TETROFOSMIN IV KIT
10.5000 | PACK | Freq: Once | INTRAVENOUS | Status: AC | PRN
Start: 2016-08-07 — End: 2016-08-07
  Administered 2016-08-07: 10.5 via INTRAVENOUS
  Filled 2016-08-07: qty 11

## 2016-08-07 MED ORDER — REGADENOSON 0.4 MG/5ML IV SOLN
0.4000 mg | Freq: Once | INTRAVENOUS | Status: AC
  Administered 2016-08-07: 0.4 mg via INTRAVENOUS

## 2016-08-07 MED ORDER — TECHNETIUM TC 99M TETROFOSMIN IV KIT
31.8000 | PACK | Freq: Once | INTRAVENOUS | Status: AC | PRN
  Administered 2016-08-07: 31.8 via INTRAVENOUS
  Filled 2016-08-07: qty 32

## 2016-08-08 ENCOUNTER — Other Ambulatory Visit: Payer: Self-pay | Admitting: Physician Assistant

## 2016-08-08 ENCOUNTER — Telehealth: Payer: Self-pay

## 2016-08-08 ENCOUNTER — Encounter: Payer: Self-pay | Admitting: Physician Assistant

## 2016-08-08 NOTE — Progress Notes (Signed)
Reviewed Mr. Bunker nuc with Dr. Angelena Form.  This patient was recently evaluated for episodic dizziness and SOB with improvement in symptoms with decreasing atenolol. However, due to persistent symptoms, he underwent nuclear stress test. Per report, baseline EKG showed NSR with 2:1 block. With exercise he had more 2:1 block as well as frequent PVCs. This prompted switch to [facility] with nuclear results showing a fixed defect but no ischemia. The stress test tech reviewed the test with Dr. Lovena Le (DOD) who felt that the patient would likely need PPM. He called Dr. Angelena Form to review this information. Dr. Angelena Form says that Dr. Lovena Le felt the rhythm changes with exercise could be concerning for ischemia. Per discussion with Dr. Angelena Form, we are recommending definitive left heart cath. I called patient to review above information. Risks and benefits of cardiac catheterization have been discussed with the patient.  These include bleeding, infection, kidney damage, stroke, heart attack, death. The patient understands these risks and is willing to proceed. This has been arranged for 08/10/15 with Dr. Irish Lack. I have also spoken with Tommye Standard NP with EP as well as Dr. Lovena Le to help facilitate EP consultation upon his arrival -- because if cath is negative and PPM is indicated, it would be worthwhile to proceed tomorrow. (Per discussion with Rennis Harding in cath lab, schedule currently appears permitting.) I also spoke with Dr. Irish Lack to keep him in the loop. Will get PT/INR when he comes in tomorrow. CBC/BMET on 12/26 were normal. Note TSH in 05/2016 by PCP was normal. Our clinic nurse called the patient with the final scheduling details and instructions once those were known.  Amybeth Sieg PA-C

## 2016-08-08 NOTE — Telephone Encounter (Signed)
New Message   Patient returning call from [facility].

## 2016-08-08 NOTE — Telephone Encounter (Signed)
-----   Message from Charlie Pitter, [place] sent at 08/08/2016  8:41 AM EST ----- Reviewed stress test with Dr. Angelena Form. Per review, patient had baseline EKG showing NSR 2:1 block. He exercised but developed continued 2:1 block and frequent PVCs, prompting change to Lexiscan. Findings above. This had been reviewed with Dr. Lovena Le who feels patient will likely need pacemaker. Dr. Lovena Le did raise concern for possible ischemia given his rhythm changes during exercise. Per review with Dr. Angelena Form, recommend to proceed with cardiac cath. Risks and benefits of cardiac catheterization have been discussed with the patient.  These include bleeding, infection, kidney damage, stroke, heart attack, death.  The patient understands these risks and is willing to proceed. Will ask nurse to help assist with scheduling this. The patient is willing to proceed tomorrow. ER precautions reviewed with patient. His labs 07/30/16 were fine - just needs PT/INR upon arrival to short stay. Dayna Dunn PA-C

## 2016-08-08 NOTE — Telephone Encounter (Signed)
Called patient back with instructions. Patient will need to be at hospital at 8:30 instead of 5:30. Time has changed for the procedure. Patient verbalized understanding of all instructions, and will call back with any questions.

## 2016-08-08 NOTE — Telephone Encounter (Signed)
Left message for patient to call back. Will go over cardiac cath instructions for tomorrow with patient when he calls back. Patient's procedure is at 7:30 and he will need to arrive at [facility] at 5:30 am.

## 2016-08-08 NOTE — Telephone Encounter (Signed)
Called patient to let him know that an EP NP will meet him in the morning at the hospital to discuss a pacemaker. Patient verbalized understanding.

## 2016-08-09 ENCOUNTER — Ambulatory Visit (HOSPITAL_COMMUNITY): Admit: 2016-08-09 | Payer: Managed Care, Other (non HMO) | Admitting: Internal Medicine

## 2016-08-09 ENCOUNTER — Encounter (HOSPITAL_COMMUNITY): Payer: Self-pay | Admitting: Interventional Cardiology

## 2016-08-09 ENCOUNTER — Encounter (HOSPITAL_COMMUNITY)
Admission: RE | Disposition: A | Discharge status: Home / Self Care | Payer: Self-pay | Source: Ambulatory Visit | Attending: Interventional Cardiology

## 2016-08-09 ENCOUNTER — Ambulatory Visit (HOSPITAL_COMMUNITY)
Admission: RE | Admit: 2016-08-09 | Discharge: 2016-08-10 | Disposition: A | Discharge status: Home / Self Care | Payer: Managed Care, Other (non HMO) | Source: Ambulatory Visit | Attending: Interventional Cardiology | Admitting: Interventional Cardiology

## 2016-08-09 DIAGNOSIS — I352 Nonrheumatic aortic (valve) stenosis with insufficiency: Secondary | ICD-10-CM | POA: Insufficient documentation

## 2016-08-09 DIAGNOSIS — Z87891 Personal history of nicotine dependence: Secondary | ICD-10-CM | POA: Diagnosis not present

## 2016-08-09 DIAGNOSIS — Z7982 Long term (current) use of aspirin: Secondary | ICD-10-CM | POA: Insufficient documentation

## 2016-08-09 DIAGNOSIS — R9439 Abnormal result of other cardiovascular function study: Secondary | ICD-10-CM | POA: Diagnosis not present

## 2016-08-09 DIAGNOSIS — K7689 Other specified diseases of liver: Secondary | ICD-10-CM | POA: Insufficient documentation

## 2016-08-09 DIAGNOSIS — I4510 Unspecified right bundle-branch block: Secondary | ICD-10-CM | POA: Diagnosis not present

## 2016-08-09 DIAGNOSIS — I441 Atrioventricular block, second degree: Secondary | ICD-10-CM

## 2016-08-09 DIAGNOSIS — Z953 Presence of xenogenic heart valve: Secondary | ICD-10-CM | POA: Insufficient documentation

## 2016-08-09 DIAGNOSIS — Z950 Presence of cardiac pacemaker: Secondary | ICD-10-CM

## 2016-08-09 DIAGNOSIS — Z8249 Family history of ischemic heart disease and other diseases of the circulatory system: Secondary | ICD-10-CM | POA: Insufficient documentation

## 2016-08-09 DIAGNOSIS — I2510 Atherosclerotic heart disease of native coronary artery without angina pectoris: Secondary | ICD-10-CM | POA: Diagnosis not present

## 2016-08-09 DIAGNOSIS — I5022 Chronic systolic (congestive) heart failure: Secondary | ICD-10-CM

## 2016-08-09 DIAGNOSIS — I428 Other cardiomyopathies: Secondary | ICD-10-CM | POA: Insufficient documentation

## 2016-08-09 DIAGNOSIS — R350 Frequency of micturition: Secondary | ICD-10-CM | POA: Insufficient documentation

## 2016-08-09 DIAGNOSIS — Z8551 Personal history of malignant neoplasm of bladder: Secondary | ICD-10-CM | POA: Insufficient documentation

## 2016-08-09 DIAGNOSIS — Z7951 Long term (current) use of inhaled steroids: Secondary | ICD-10-CM | POA: Diagnosis not present

## 2016-08-09 DIAGNOSIS — R351 Nocturia: Secondary | ICD-10-CM | POA: Insufficient documentation

## 2016-08-09 DIAGNOSIS — I110 Hypertensive heart disease with heart failure: Secondary | ICD-10-CM | POA: Insufficient documentation

## 2016-08-09 HISTORY — PX: EP IMPLANTABLE DEVICE: SHX172B

## 2016-08-09 HISTORY — PX: CARDIAC CATHETERIZATION: SHX172

## 2016-08-09 LAB — CBC
HEMATOCRIT: 38.8 % — AB (ref 39.0–52.0)
HEMOGLOBIN: 13.7 g/dL (ref 13.0–17.0)
MCH: 31.8 pg (ref 26.0–34.0)
MCHC: 35.3 g/dL (ref 30.0–36.0)
MCV: 90 fL (ref 78.0–100.0)
Platelets: 227 10*3/uL (ref 150–400)
RBC: 4.31 MIL/uL (ref 4.22–5.81)
RDW: 13.3 % (ref 11.5–15.5)
WBC: 5.6 10*3/uL (ref 4.0–10.5)

## 2016-08-09 LAB — CREATININE, SERUM
CREATININE: 0.84 mg/dL (ref 0.61–1.24)
GFR calc Af Amer: >60 mL/min (ref >60)

## 2016-08-09 LAB — SURGICAL PCR SCREEN
MRSA, PCR: NEGATIVE
Staphylococcus aureus: NEGATIVE

## 2016-08-09 LAB — PROTIME-INR
INR: 0.96
Prothrombin Time: 12.8 seconds (ref 11.4–15.2)

## 2016-08-09 SURGERY — BIV PACEMAKER INSERTION CRT-P

## 2016-08-09 SURGERY — LEFT HEART CATH AND CORONARY ANGIOGRAPHY

## 2016-08-09 MED ORDER — SODIUM CHLORIDE 0.9 % IR SOLN
80.0000 mg | Status: AC
  Administered 2016-08-09: 80 mg

## 2016-08-09 MED ORDER — FENTANYL CITRATE (PF) 100 MCG/2ML IJ SOLN
INTRAMUSCULAR | Status: AC
  Filled 2016-08-09: qty 2

## 2016-08-09 MED ORDER — HEPARIN SODIUM (PORCINE) 1000 UNIT/ML IJ SOLN
INTRAMUSCULAR | Status: AC
  Filled 2016-08-09: qty 1

## 2016-08-09 MED ORDER — HEPARIN SODIUM (PORCINE) 5000 UNIT/ML IJ SOLN
5000.0000 [IU] | Freq: Three times a day (TID) | INTRAMUSCULAR | Status: DC
  Administered 2016-08-09 – 2016-08-10 (×2): 5000 [IU] via SUBCUTANEOUS
  Filled 2016-08-09 (×2): qty 1

## 2016-08-09 MED ORDER — HEPARIN (PORCINE) IN NACL 2-0.9 UNIT/ML-% IJ SOLN
INTRAMUSCULAR | Status: DC | PRN
  Administered 2016-08-09: 10 mL via INTRA_ARTERIAL

## 2016-08-09 MED ORDER — IOPAMIDOL (ISOVUE-370) INJECTION 76%
INTRAVENOUS | Status: AC
  Filled 2016-08-09: qty 50

## 2016-08-09 MED ORDER — SODIUM CHLORIDE 0.9 % IV SOLN: 250.0000 mL | INTRAVENOUS | Status: DC | PRN

## 2016-08-09 MED ORDER — MUPIROCIN 2 % EX OINT
TOPICAL_OINTMENT | CUTANEOUS | Status: AC
  Filled 2016-08-09: qty 22

## 2016-08-09 MED ORDER — SODIUM CHLORIDE 0.9 % WEIGHT BASED INFUSION: 1.0000 mL/kg/h | INTRAVENOUS | Status: DC

## 2016-08-09 MED ORDER — HEPARIN SODIUM (PORCINE) 1000 UNIT/ML IJ SOLN
INTRAMUSCULAR | Status: DC | PRN
  Administered 2016-08-09: 4500 [IU] via INTRAVENOUS

## 2016-08-09 MED ORDER — CEFAZOLIN SODIUM-DEXTROSE 2-3 GM-% IV SOLR
INTRAVENOUS | Status: DC | PRN
  Administered 2016-08-09: 2 g via INTRAVENOUS

## 2016-08-09 MED ORDER — TAMSULOSIN HCL 0.4 MG PO CAPS
0.4000 mg | ORAL_CAPSULE | Freq: Two times a day (BID) | ORAL | Status: DC
  Administered 2016-08-09 – 2016-08-10 (×2): 0.4 mg via ORAL
  Filled 2016-08-09 (×2): qty 1

## 2016-08-09 MED ORDER — ASPIRIN 81 MG PO CHEW: 81.0000 mg | CHEWABLE_TABLET | ORAL | Status: DC

## 2016-08-09 MED ORDER — ZOLPIDEM TARTRATE 5 MG PO TABS
5.0000 mg | ORAL_TABLET | Freq: Every evening | ORAL | Status: DC | PRN
  Administered 2016-08-09: 5 mg via ORAL
  Filled 2016-08-09: qty 1

## 2016-08-09 MED ORDER — HEPARIN (PORCINE) IN NACL 2-0.9 UNIT/ML-% IJ SOLN
INTRAMUSCULAR | Status: DC | PRN
  Administered 2016-08-09: 1000 mL

## 2016-08-09 MED ORDER — LIDOCAINE HCL (PF) 1 % IJ SOLN
INTRAMUSCULAR | Status: DC | PRN
  Administered 2016-08-09: 2 mL

## 2016-08-09 MED ORDER — FENTANYL CITRATE (PF) 100 MCG/2ML IJ SOLN
INTRAMUSCULAR | Status: DC | PRN
  Administered 2016-08-09: 12.5 ug via INTRAVENOUS
  Administered 2016-08-09: 25 ug via INTRAVENOUS
  Administered 2016-08-09 (×2): 12.5 ug via INTRAVENOUS
  Administered 2016-08-09: 25 ug via INTRAVENOUS
  Administered 2016-08-09 (×3): 12.5 ug via INTRAVENOUS

## 2016-08-09 MED ORDER — SODIUM CHLORIDE 0.9 % IV SOLN
INTRAVENOUS | Status: AC
  Administered 2016-08-09: 15:00:00 via INTRAVENOUS

## 2016-08-09 MED ORDER — IOPAMIDOL (ISOVUE-370) INJECTION 76%
INTRAVENOUS | Status: DC | PRN
  Administered 2016-08-09: 10 mL via INTRAVENOUS

## 2016-08-09 MED ORDER — SODIUM CHLORIDE 0.9 % IV SOLN
INTRAVENOUS | Status: DC
  Administered 2016-08-09: 13:00:00 via INTRAVENOUS

## 2016-08-09 MED ORDER — FENTANYL CITRATE (PF) 100 MCG/2ML IJ SOLN
INTRAMUSCULAR | Status: DC | PRN
  Administered 2016-08-09: 50 ug via INTRAVENOUS
  Administered 2016-08-09: 25 ug via INTRAVENOUS

## 2016-08-09 MED ORDER — SODIUM CHLORIDE 0.9 % WEIGHT BASED INFUSION
3.0000 mL/kg/h | INTRAVENOUS | Status: DC
  Administered 2016-08-09: 3 mL/kg/h via INTRAVENOUS

## 2016-08-09 MED ORDER — HEPARIN (PORCINE) IN NACL 2-0.9 UNIT/ML-% IJ SOLN
INTRAMUSCULAR | Status: AC
  Filled 2016-08-09: qty 1000

## 2016-08-09 MED ORDER — ACETAMINOPHEN 325 MG PO TABS
650.0000 mg | ORAL_TABLET | ORAL | Status: DC | PRN
  Administered 2016-08-09 – 2016-08-10 (×2): 650 mg via ORAL
  Filled 2016-08-09 (×2): qty 2

## 2016-08-09 MED ORDER — MIDAZOLAM HCL 5 MG/5ML IJ SOLN
INTRAMUSCULAR | Status: DC | PRN
  Administered 2016-08-09 (×9): 1 mg via INTRAVENOUS

## 2016-08-09 MED ORDER — HEPARIN (PORCINE) IN NACL 2-0.9 UNIT/ML-% IJ SOLN
INTRAMUSCULAR | Status: AC
  Filled 2016-08-09: qty 500

## 2016-08-09 MED ORDER — LIDOCAINE HCL (PF) 1 % IJ SOLN
INTRAMUSCULAR | Status: DC | PRN
  Administered 2016-08-09: 40 mL

## 2016-08-09 MED ORDER — IOPAMIDOL (ISOVUE-370) INJECTION 76%
INTRAVENOUS | Status: AC
  Filled 2016-08-09: qty 100

## 2016-08-09 MED ORDER — HEPARIN SODIUM (PORCINE) 5000 UNIT/ML IJ SOLN: 5000.0000 [IU] | Freq: Three times a day (TID) | INTRAMUSCULAR | Status: DC

## 2016-08-09 MED ORDER — SODIUM CHLORIDE 0.9% FLUSH
3.0000 mL | Freq: Two times a day (BID) | INTRAVENOUS | Status: DC
  Administered 2016-08-09 – 2016-08-10 (×3): 3 mL via INTRAVENOUS

## 2016-08-09 MED ORDER — CEFAZOLIN SODIUM-DEXTROSE 2-4 GM/100ML-% IV SOLN: 2.0000 g | INTRAVENOUS | Status: DC

## 2016-08-09 MED ORDER — MUPIROCIN 2 % EX OINT: 1.0000 "application " | TOPICAL_OINTMENT | Freq: Once | CUTANEOUS | Status: DC

## 2016-08-09 MED ORDER — ASPIRIN EC 81 MG PO TBEC
81.0000 mg | DELAYED_RELEASE_TABLET | Freq: Every day | ORAL | Status: DC
  Administered 2016-08-10: 81 mg via ORAL
  Filled 2016-08-09: qty 1

## 2016-08-09 MED ORDER — HYDROCODONE-ACETAMINOPHEN 5-325 MG PO TABS
0.5000 | ORAL_TABLET | Freq: Every day | ORAL | Status: DC | PRN
  Administered 2016-08-09: 1 via ORAL
  Filled 2016-08-09: qty 1

## 2016-08-09 MED ORDER — MIDAZOLAM HCL 2 MG/2ML IJ SOLN
INTRAMUSCULAR | Status: DC | PRN
  Administered 2016-08-09: 1 mg via INTRAVENOUS
  Administered 2016-08-09: 2 mg via INTRAVENOUS

## 2016-08-09 MED ORDER — LIDOCAINE HCL (PF) 1 % IJ SOLN
INTRAMUSCULAR | Status: AC
  Filled 2016-08-09: qty 60

## 2016-08-09 MED ORDER — ASPIRIN 81 MG PO CHEW: 81.0000 mg | CHEWABLE_TABLET | Freq: Every day | ORAL | Status: DC

## 2016-08-09 MED ORDER — IOPAMIDOL (ISOVUE-370) INJECTION 76%
INTRAVENOUS | Status: DC | PRN
  Administered 2016-08-09: 120 mL via INTRA_ARTERIAL

## 2016-08-09 MED ORDER — CEFAZOLIN IN D5W 1 GM/50ML IV SOLN
1.0000 g | Freq: Four times a day (QID) | INTRAVENOUS | Status: AC
  Administered 2016-08-09 – 2016-08-10 (×3): 1 g via INTRAVENOUS
  Filled 2016-08-09 (×3): qty 50

## 2016-08-09 MED ORDER — MIDAZOLAM HCL 2 MG/2ML IJ SOLN
INTRAMUSCULAR | Status: AC
  Filled 2016-08-09: qty 2

## 2016-08-09 MED ORDER — SODIUM CHLORIDE 0.9 % IV SOLN: INTRAVENOUS | Status: DC

## 2016-08-09 MED ORDER — SODIUM CHLORIDE 0.9 % IR SOLN
Status: AC
  Filled 2016-08-09: qty 2

## 2016-08-09 MED ORDER — ACETAMINOPHEN 325 MG PO TABS: 325.0000 mg | ORAL_TABLET | ORAL | Status: DC | PRN

## 2016-08-09 MED ORDER — FLUTICASONE PROPIONATE 50 MCG/ACT NA SUSP: 1.0000 | Freq: Two times a day (BID) | NASAL | Status: DC | PRN

## 2016-08-09 MED ORDER — ONDANSETRON HCL 4 MG/2ML IJ SOLN: 4.0000 mg | Freq: Four times a day (QID) | INTRAMUSCULAR | Status: DC | PRN

## 2016-08-09 MED ORDER — SODIUM CHLORIDE 0.9% FLUSH: 3.0000 mL | INTRAVENOUS | Status: DC | PRN

## 2016-08-09 MED ORDER — VERAPAMIL HCL 2.5 MG/ML IV SOLN
INTRAVENOUS | Status: AC
  Filled 2016-08-09: qty 2

## 2016-08-09 MED ORDER — LIDOCAINE HCL (PF) 1 % IJ SOLN
INTRAMUSCULAR | Status: AC
  Filled 2016-08-09: qty 30

## 2016-08-09 MED ORDER — CEFAZOLIN SODIUM-DEXTROSE 2-4 GM/100ML-% IV SOLN
INTRAVENOUS | Status: AC
  Filled 2016-08-09: qty 100

## 2016-08-09 MED ORDER — CHLORHEXIDINE GLUCONATE 4 % EX LIQD: 60.0000 mL | Freq: Once | CUTANEOUS | Status: DC

## 2016-08-09 MED ORDER — HEPARIN (PORCINE) IN NACL 2-0.9 UNIT/ML-% IJ SOLN
INTRAMUSCULAR | Status: DC | PRN
  Administered 2016-08-09: 500 mL

## 2016-08-09 MED ORDER — MIDAZOLAM HCL 5 MG/5ML IJ SOLN
INTRAMUSCULAR | Status: AC
  Filled 2016-08-09: qty 5

## 2016-08-09 SURGICAL SUPPLY — 11 items
CATH EXPO 5F MPA-1 (CATHETERS) ×3 IMPLANT
CATH IMPULSE 5F ANG/FL3.5 (CATHETERS) ×3 IMPLANT
DEVICE RAD COMP TR BAND LRG (VASCULAR PRODUCTS) ×3 IMPLANT
GLIDESHEATH SLEND SS 6F .021 (SHEATH) ×3 IMPLANT
GUIDEWIRE INQWIRE 1.5J.035X260 (WIRE) ×1 IMPLANT
INQWIRE 1.5J .035X260CM (WIRE) ×3
KIT HEART LEFT (KITS) ×3 IMPLANT
PACK CARDIAC CATHETERIZATION (CUSTOM PROCEDURE TRAY) ×3 IMPLANT
TRANSDUCER W/STOPCOCK (MISCELLANEOUS) ×3 IMPLANT
TUBING CIL FLEX 10 FLL-RA (TUBING) ×3 IMPLANT
WIRE HI TORQ VERSACORE-J 145CM (WIRE) ×3 IMPLANT

## 2016-08-09 SURGICAL SUPPLY — 15 items
ADAPTER SEALING SSA-EW-09 (MISCELLANEOUS) ×2 IMPLANT
CABLE SURGICAL S-101-97-12 (CABLE) ×2 IMPLANT
CATH ATTAIN COM SURV 6250V-MB2 (CATHETERS) ×2 IMPLANT
CATH HEX JOS 2-5-2 65CM 6F REP (CATHETERS) ×2 IMPLANT
DEVICE CRTP PERCEPTA QUAD MRI (Pacemaker) ×2 IMPLANT
LEAD ATTAIN PERFORM ST 4398-88 (Lead) ×2 IMPLANT
LEAD CAPSURE NOVUS 5076-52CM (Lead) ×2 IMPLANT
LEAD CAPSURE NOVUS 5076-58CM (Lead) ×2 IMPLANT
PAD DEFIB LIFELINK (PAD) ×2 IMPLANT
SHEATH CLASSIC 7F (SHEATH) ×6 IMPLANT
SHEATH CLASSIC 9.5F (SHEATH) ×2 IMPLANT
SLITTER 6232ADJ (MISCELLANEOUS) ×2 IMPLANT
TOOL TRANSVALV INSERT TVI-07 (MISCELLANEOUS) ×2 IMPLANT
TRAY PACEMAKER INSERTION (PACKS) ×2 IMPLANT
WIRE ACUITY WHISPER EDS 4648 (WIRE) ×2 IMPLANT

## 2016-08-09 NOTE — H&P (View-Only) (Signed)
Reviewed Mr. Paco nuc with Dr. Angelena Form.  This patient was recently evaluated for episodic dizziness and SOB with improvement in symptoms with decreasing atenolol. However, due to persistent symptoms, he underwent nuclear stress test. Per report, baseline EKG showed NSR with 2:1 block. With exercise he had more 2:1 block as well as frequent PVCs. This prompted switch to [facility] with nuclear results showing a fixed defect but no ischemia. The stress test tech reviewed the test with Dr. Lovena Le (DOD) who felt that the patient would likely need PPM. He called Dr. Angelena Form to review this information. Dr. Angelena Form says that Dr. Lovena Le felt the rhythm changes with exercise could be concerning for ischemia. Per discussion with Dr. Angelena Form, we are recommending definitive left heart cath. I called patient to review above information. Risks and benefits of cardiac catheterization have been discussed with the patient.  These include bleeding, infection, kidney damage, stroke, heart attack, death. The patient understands these risks and is willing to proceed. This has been arranged for 08/10/15 with Dr. Irish Lack. I have also spoken with Tommye Standard NP with EP as well as Dr. Lovena Le to help facilitate EP consultation upon his arrival -- because if cath is negative and PPM is indicated, it would be worthwhile to proceed tomorrow. (Per discussion with Rennis Harding in cath lab, schedule currently appears permitting.) I also spoke with Dr. Irish Lack to keep him in the loop. Will get PT/INR when he comes in tomorrow. CBC/BMET on 12/26 were normal. Note TSH in 05/2016 by PCP was normal. Our clinic nurse called the patient with the final scheduling details and instructions once those were known.  Dayna Dunn PA-C

## 2016-08-09 NOTE — Consult Note (Signed)
ELECTROPHYSIOLOGY CONSULT NOTE    Patient ID: [name] MRN: [id number], DOB/AGE: 63-Feb-1957 63 y.o.  Admit date: 08/09/2016 Date of Consult: 08/09/2016   Primary Physician: Nilda Simmer, MD Primary Cardiologist: Dr. Angelena Form Requesting MD: Dr. Milton Ferguson  Reason for Consultation: heart block  HPI: [name] is a 61 y.o. male bicuspid AV/aortic insufficiency s/p bioprosthetic AVR and aortic root replacement 05/2009, NICM (previous EF 45-50% in 2009, 50-55% in 2014), RBBB, bladder CA s/p resection 2014, HTN, ?diffuse hepatocellular disease, prior heavy alcohol use who presents for routine follow-up. [place] 08/2008 showed no angiographic evidence of CAD, mild global LV systolic dysfunction, mod AI, mild dilation of aortic root - EF was 45-50% in 2009, with f/u 07/2013 showing mild LVH, EF 50-55%, normal AVR, prosthetic aortic root 89mm, mildly dilated RV/RA (however, see below about EF being possibly lower).   He was seen in the office recently by Sharrell Ku, PA for c/o worsening exertional tolerance, usually very fit and with few if any intolerances to physical work, routinely riding his bike many miles.  He has also noticed some orthostatic-type dizziness upon standing. However, he had been able to walk 9 miles over the course of a few days in [place] without any limitation. Some of his dyspnea was suspected to be due to chronotropic incompetence. His atenolol was decreased.  Hinton Dyer Dunn's note from 07/30/16 observed: 2D echo 07/10/16: EF 40-45%, grade 1 DD, bioprosthetic valve functioning normally, mildly dilated ascending aorta (78mm aortic root, 76mm ascending aortic diameter), mild-mod dilated RV, RV systolic function mildly reduced, mildly dilated RA - I had Dr. Aundra Dubin (reading physician) view his echo from 2014 since most other EF assessments were in the range of this recent echo, and he agreed that the echo in 2014 also had a lower EF than reported (i.e. lower than the 50-55%  stated), and he felt that this most recent EF was slightly lower than prior but not markedly so.  The patient was sent for stress testing given symptoms per report, baseline EKG showed NSR with 2:1 block. With exercise he had more 2:1 block as well as frequent PVCs. This prompted switch to [facility] with nuclear results showing a fixed defect but no ischemia. The stress test tech reviewed the test with Dr. Lovena Le (DOD) who felt that the patient would likely need PPM. He called Dr. Angelena Form to review this information. Dr. Angelena Form says that Dr. Lovena Le felt the rhythm changes with exercise could be concerning for ischemia. Per discussion with Dr. Angelena Form, we are recommending definitive left heart cath.   He comes this morning for LHC and possible PPM implant pending cath findings.  He is feeling well this morning without recent illness, fever or any active complaints.  Past Medical History:  Diagnosis Date  . Abnormal LFTs    a. eval in 2017 for this - abd [place] with diffuse hepatocellular disease.  . Aortic insufficiency    a. bicuspid AV/AI s/p bioprosthetic AVR and aortic root replacement 05/2009.  [name] Bicuspid aortic valve   . Bladder cancer ([place])    recurrent bladder tumor   . Dilated aortic root ([place])   . Essential hypertension   . Former consumption of alcohol   . Frequency of urination   . Hepatocellular dysfunction   . History of aortic insufficiency   . NICM (nonischemic cardiomyopathy) ([place])    a. Previous EF 45-50% in 2009 - echo in 2014 reported EF 50-55% but upon review felt to be lower - f/u echo  07/2016 with EF 40-45%. (no CAD by cath in 2010).  . Nocturia   . RBBB   . S/P aortic valve replacement with bioprosthetic valve 05-10-2009   BY DR Ricard Dillon   CARDIOLOGIST-  DR [facility]     Surgical History:  Past Surgical History:  Procedure Laterality Date  . APPENDECTOMY  AGE 72  . BIOLOGICAL BENTALL AORTIC ROOT REPLACEMENT W/ PERICARDIAL TISSUE VALVE AND SYNTHETIC AORTIC GRAFT   05-10-2009  DR OWENS   BICUSPID AV W/ SEVERE AI &  ANEURYSM OF AORTIC ROOT AND PROXIMAL ASCENDING THORACIC AORTA  . CARDIAC CATHETERIZATION  08-10-2008  DR MCALHANY   NO EVIDENCE CAD/ MILD GLOBAL LVSF/ MODERATE AORTIC INSUFFICIENCY/ MILD DILATION OF THE AORTIC ROOT/ EF 45-50%  . CATARACT EXTRACTION W/ INTRAOCULAR LENS IMPLANT Right 2015  . CYSTOSCOPY W/ RETROGRADES Bilateral 12/31/2012   Procedure: CYSTOSCOPY WITH RETROGRADE PYELOGRAM With CYSTOGRAM AND DEEP BLADDER BIOPSY ;  Surgeon: Molli Hazard, MD;  Location: [facility];  Service: Urology;  Laterality: Bilateral;  . CYSTOSCOPY W/ RETROGRADES Bilateral 10/12/2015   Procedure: BILATERAL RETROGRADE PYELOGRAM AND POST OP MITOMYCIN C;  Surgeon: Ardis Hughs, MD;  Location: [facility];  Service: Urology;  Laterality: Bilateral;  . CYSTOSCOPY WITH BIOPSY N/A 02/22/2013   Procedure: CYSTOSCOPY WITH BIOPSY;  Surgeon: Molli Hazard, MD;  Location: [facility];  Service: Urology;  Laterality: N/A;  . CYSTOSCOPY WITH BIOPSY N/A 08/02/2013   Procedure: CYSTOSCOPY WITH BLADDER BIOPSY AND  BILATERAL RETROGRADES;  Surgeon: Molli Hazard, MD;  Location: [facility];  Service: Urology;  Laterality: N/A;  . HYDROCELE EXCISION / REPAIR  2009  . TRANSTHORACIC ECHOCARDIOGRAM  07-23-2013  DR MCALHANY   mild LVH,  ef 50-55%/   NORMAL FUNCTIONING BIOPROSTHETIC AORTIC VALVE (mean granient 23mmHg, peak gradient 54mmHg)/ 65mm prosthetic aortic root/  trivial PR/  mild RAE/  mild dilated RV  . TRANSURETHRAL RESECTION OF BLADDER TUMOR N/A 12/31/2012   Procedure: TRANSURETHRAL RESECTION OF BLADDER TUMOR (TURBT);  Surgeon: Molli Hazard, MD;  Location: [facility];  Service: Urology;  Laterality: N/A;  . TRANSURETHRAL RESECTION OF BLADDER TUMOR N/A 10/12/2015   Procedure: TRANSURETHRAL RESECTION OF BLADDER TUMOR (TURBT);  Surgeon: Ardis Hughs, MD;  Location:  [facility];  Service: Urology;  Laterality: N/A;     Prescriptions Prior to Admission  Medication Sig Dispense Refill Last Dose  . aspirin EC 81 MG tablet Take 81 mg by mouth daily.   Taking  . fluticasone (FLONASE) 50 MCG/ACT nasal spray Place 1-2 sprays into both nostrils 2 (two) times daily as needed for allergies or rhinitis.     [name] HYDROcodone-acetaminophen (NORCO/VICODIN) 5-325 MG tablet Take 0.5-1 tablets by mouth daily as needed for moderate pain.    Taking  . Melatonin 10 MG CAPS Take 10 mg by mouth at bedtime as needed (sleep).     . meloxicam (MOBIC) 15 MG tablet Take 15 mg by mouth daily.     . Multiple Vitamin (MULTIVITAMIN) tablet Take 1 tablet by mouth 2 (two) times daily.    Taking  . Omega-3 Fatty Acids (FISH OIL ULTRA) 1400 MG CAPS Take 1,400 mg by mouth daily.     . Pseudoephedrine-Acetaminophen (THERAFLU SINUS MAX ST PO) Take 1 capsule by mouth daily as needed (cold symtpoms).     . tamsulosin (FLOMAX) 0.4 MG CAPS capsule Take 0.4 mg by mouth 2 (two) times daily.    Taking  .  zolpidem (AMBIEN) 10 MG tablet Take 5 mg by mouth at bedtime as needed for sleep. May take an additional 5mg s as needed for sleep if the first dose doesn't fully work   Taking    Inpatient Medications:  . mupirocin ointment      . aspirin  81 mg Oral Pre-Cath  .  ceFAZolin (ANCEF) IV  2 g Intravenous On Call  . chlorhexidine  60 mL Topical Once  . gentamicin irrigation  80 mg Irrigation On Call  . mupirocin ointment  1 application Topical Once    Allergies: No Known Allergies  Social History   Social History  . Marital status: Single    Spouse name: N/A  . Number of children: N/A  . Years of education: N/A   Occupational History  . Not on file.   Social History Main Topics  . Smoking status: Former Smoker    Packs/day: 2.00    Years: 25.00    Types: Cigarettes    Quit date: 12/30/2007  . Smokeless tobacco: Never Used  . Alcohol use No  . Drug use: No  . Sexual  activity: Not on file   Other Topics Concern  . Not on file   Social History Narrative   Tobacco use 1 1/2 ppd for 35 years- stopped 2010   No alcohol    No illicit drugs   Single    2 children     Family History  Problem Relation Age of Onset  . Mitral valve prolapse Father      Review of Systems: All other systems reviewed and are otherwise negative except as noted above.  Physical Exam: Vitals:   08/09/16 0841  BP: (!) 152/92  Pulse: 64  Resp: 18  Temp: 97.7 F (36.5 C)  TempSrc: Oral  SpO2: 99%  Weight: 185 lb (83.9 kg)  Height: 6' (1.829 m)    GEN- The patient is well appearing, alert and oriented x 3 today.   HEENT: normocephalic, atraumatic; sclera clear, conjunctiva pink; hearing intact; oropharynx clear; neck supple, no JVP Lymph- no cervical lymphadenopathy Lungs- Clear to ausculation bilaterally, normal work of breathing.  No wheezes, rales, rhonchi Heart- Regular rate and rhythm, 1/6 SM, rubs or gallops, PMI not laterally displaced GI- soft, non-tender, non-distended Extremities- no clubbing, cyanosis, or edema MS- no significant deformity or atrophy Skin- warm and dry, no rash or lesion Psych- euthymic mood, full affect Neuro- no gross deficits observed  Labs:   Lab Results  Component Value Date   WBC 8.8 07/30/2016   HGB 14.3 07/30/2016   HCT 43.1 07/30/2016   MCV 96.0 07/30/2016   PLT 224 07/30/2016       EKG: 07/30/16, SR, RBBB  08/07/16: stress myoview  There was no ST segment deviation noted during stress.  This is an intermediate risk study. 1. Non-gated study.  2. 2:1 AV block and possible type 2 2nd degree AV block noted.  3. Fixed small, mild mid to apical anteroseptal perfusion defect.  Small area of infarction versus attenuation (study not gated, cannot assess wall motion).  No ischemia.  4. Intermediate risk because of heart block.   07/10/16: TTE Study Conclusions - Left ventricle: The cavity size was mildly dilated.  Wall   thickness was normal. Systolic function was mildly to moderately   reduced. The estimated ejection fraction was in the range of 40%   to 45%. Diffuse hypokinesis. Doppler parameters are consistent   with abnormal left ventricular relaxation (grade 1 diastolic  dysfunction). - Aortic valve: Bioprosthetic aortic valve appears to function   normally. There was no regurgitation. Mean gradient (S): 11 mm   Hg. - Aorta: Status post aortic root replacement. Mildly dilated   ascending aorta. Aortic root dimension: 41 mm (ED). Ascending   aortic diameter: 38 mm (S). - Mitral valve: Mildly calcified annulus. - Right ventricle: The cavity size was mildly to moderately   dilated. Systolic function was mildly reduced. - Right atrium: The atrium was mildly dilated. - Pulmonary arteries: No complete TR doppler jet so unable to   estimate PA systolic pressure. - Inferior vena cava: The vessel was normal in size. The   respirophasic diameter changes were in the normal range (= 50%),   consistent with normal central venous pressure. Impressions: - Mildly dilated LV with EF 40-45%, diffuse hypokinesis. Mildly   dilated RV with mildly decreased systolic function. Bioprosthetic   aortic valve appears to function normally.    Assessment and Plan:   1. Fatigue, exertional intolerance, no syncope     2:1 AV block observed     Pending heart cath, if no CAD to explain conduction disease will plan for PPM  Risks/benefits of PPM implant were discussed with the patient by Dr. Lovena Le, he is agreeable to proceed Pre-op labs reviewed  2. Bicuspid AV (re-do)     functioning well  3. NICM     Stable, no evidence of CHF  4. HTN     Recently off atenolol       Signed, Tommye Standard, PA-C 08/09/2016 9:22 AM  EP Attending  Patient seen and examined. Agree with the findings as noted above. He presents today with symptomatic 2:1 AV block in the setting of prior AVR/Bental procedure. He has  progressively conduction system disease. His exam is noted above and reflects my findings as well. He had exercise induced PVC's and has undergone cardiac cath demonstrating no obstructive CAD. He will undergo biv PPM insertion as his conduction system disease will only progress and he has class 2 symptoms with exertion.   Mikle Bosworth.D.

## 2016-08-09 NOTE — H&P (Signed)
Expand All Collapse All   [] Hide copied text   ELECTROPHYSIOLOGY CONSULT NOTE    Patient ID: [name] MRN: [id number], DOB/AGE: 03-19-1956 61 y.o.  Admit date: 08/09/2016 Date of Consult: 08/09/2016   Primary Physician: Nilda Simmer, MD Primary Cardiologist: Dr. Angelena Form Requesting MD: Dr. Milton Ferguson  Reason for Consultation: heart block  HPI: [name] is a 61 y.o. male bicuspid AV/aortic insufficiency s/p bioprosthetic AVR and aortic root replacement 05/2009, NICM (previous EF 45-50% in 2009, 50-55% in 2014), RBBB, bladder CA s/p resection 2014, HTN, ?diffuse hepatocellular disease, prior heavy alcohol use who presents for routine follow-up. [place] 08/2008 showed no angiographic evidence of CAD, mild global LV systolic dysfunction, mod AI, mild dilation of aortic root - EF was 45-50% in 2009, with f/u 07/2013 showing mild LVH, EF 50-55%, normal AVR, prosthetic aortic root 12mm, mildly dilated RV/RA (however, see below about EF being possibly lower).   He was seen in the office recently by Sharrell Ku, PA for c/o worsening exertional tolerance, usually very fit and with few if any intolerances to physical work, routinely riding his bike many miles.  He has also noticed some orthostatic-type dizziness upon standing. However, he had been able to walk 9 miles over the course of a few days in [place] without any limitation. Some of his dyspnea was suspected to be due to chronotropic incompetence.His atenolol was decreased.  Hinton Dyer Dunn's note from 07/30/16 observed: 2D echo 07/10/16: EF 40-45%, grade 1 DD, bioprosthetic valve functioning normally, mildly dilated ascending aorta (18mm aortic root, 86mm ascending aortic diameter), mild-mod dilated RV, RV systolic function mildly reduced, mildly dilated RA - I had Dr. Aundra Dubin (reading physician) view his echo from 2014 since most other EF assessments were in the range of this recent echo, and he agreed that the echo in 2014 also  had a lower EF than reported (i.e. lower than the 50-55% stated), and hefelt that this most recent EF was slightly lower than prior but not markedly so.  The patient was sent for stress testing given symptoms per report, baseline EKG showed NSR with 2:1 block. With exercise he had more 2:1 block as well as frequent PVCs. This prompted switch to [facility] with nuclear results showing a fixed defect but no ischemia. The stress test tech reviewed the test with Dr. Lovena Le (DOD) who felt that the patient would likely need PPM. He called Dr. Angelena Form to review this information. Dr. Angelena Form says that Dr. Lovena Le felt the rhythm changes with exercise could be concerning for ischemia. Per discussion with Dr. Angelena Form, we are recommending definitive left heart cath.   He comes this morning for LHC and possible PPM implant pending cath findings.  He is feeling well this morning without recent illness, fever or any active complaints.      Past Medical History:  Diagnosis Date  . Abnormal LFTs    a. eval in 2017 for this - abd [place] with diffuse hepatocellular disease.  . Aortic insufficiency    a. bicuspid AV/AI s/p bioprosthetic AVR and aortic root replacement 05/2009.  [name] Bicuspid aortic valve   . Bladder cancer ([place])    recurrent bladder tumor   . Dilated aortic root ([place])   . Essential hypertension   . Former consumption of alcohol   . Frequency of urination   . Hepatocellular dysfunction   . History of aortic insufficiency   . NICM (nonischemic cardiomyopathy) ([place])    a. Previous EF 45-50% in 2009 - echo in 2014 reported  EF 50-55% but upon review felt to be lower - f/u echo 07/2016 with EF 40-45%. (no CAD by cath in 2010).  . Nocturia   . RBBB   . S/P aortic valve replacement with bioprosthetic valve 05-10-2009   BY DR Ricard Dillon   CARDIOLOGIST-  DR [facility]     Surgical History:       Past Surgical History:  Procedure Laterality Date  . APPENDECTOMY  AGE 32  . BIOLOGICAL  BENTALL AORTIC ROOT REPLACEMENT W/ PERICARDIAL TISSUE VALVE AND SYNTHETIC AORTIC GRAFT  05-10-2009  DR OWENS   BICUSPID AV W/ SEVERE AI &  ANEURYSM OF AORTIC ROOT AND PROXIMAL ASCENDING THORACIC AORTA  . CARDIAC CATHETERIZATION  08-10-2008  DR MCALHANY   NO EVIDENCE CAD/ MILD GLOBAL LVSF/ MODERATE AORTIC INSUFFICIENCY/ MILD DILATION OF THE AORTIC ROOT/ EF 45-50%  . CATARACT EXTRACTION W/ INTRAOCULAR LENS IMPLANT Right 2015  . CYSTOSCOPY W/ RETROGRADES Bilateral 12/31/2012   Procedure: CYSTOSCOPY WITH RETROGRADE PYELOGRAM With CYSTOGRAM AND DEEP BLADDER BIOPSY ;  Surgeon: Molli Hazard, MD;  Location: [facility];  Service: Urology;  Laterality: Bilateral;  . CYSTOSCOPY W/ RETROGRADES Bilateral 10/12/2015   Procedure: BILATERAL RETROGRADE PYELOGRAM AND POST OP MITOMYCIN C;  Surgeon: Ardis Hughs, MD;  Location: [facility];  Service: Urology;  Laterality: Bilateral;  . CYSTOSCOPY WITH BIOPSY N/A 02/22/2013   Procedure: CYSTOSCOPY WITH BIOPSY;  Surgeon: Molli Hazard, MD;  Location: [facility];  Service: Urology;  Laterality: N/A;  . CYSTOSCOPY WITH BIOPSY N/A 08/02/2013   Procedure: CYSTOSCOPY WITH BLADDER BIOPSY AND  BILATERAL RETROGRADES;  Surgeon: Molli Hazard, MD;  Location[facility];  Service: Urology;  Laterality: N/A;  . HYDROCELE EXCISION / REPAIR  2009  . TRANSTHORACIC ECHOCARDIOGRAM  07-23-2013  DR MCALHANY   mild LVH,  ef 50-55%/   NORMAL FUNCTIONING BIOPROSTHETIC AORTIC VALVE (mean granient 9mmHg, peak gradient 70mmHg)/ 80mm prosthetic aortic root/  trivial PR/  mild RAE/  mild dilated RV  . TRANSURETHRAL RESECTION OF BLADDER TUMOR N/A 12/31/2012   Procedure: TRANSURETHRAL RESECTION OF BLADDER TUMOR (TURBT);  Surgeon: Molli Hazard, MD;  Location: [facility];  Service: Urology;  Laterality: N/A;  . TRANSURETHRAL RESECTION OF BLADDER TUMOR N/A 10/12/2015    Procedure: TRANSURETHRAL RESECTION OF BLADDER TUMOR (TURBT);  Surgeon: Ardis Hughs, MD;  Location: [facility];  Service: Urology;  Laterality: N/A;            Prescriptions Prior to Admission  Medication Sig Dispense Refill Last Dose  . aspirin EC 81 MG tablet Take 81 mg by mouth daily.   Taking  . fluticasone (FLONASE) 50 MCG/ACT nasal spray Place 1-2 sprays into both nostrils 2 (two) times daily as needed for allergies or rhinitis.     [name] HYDROcodone-acetaminophen (NORCO/VICODIN) 5-325 MG tablet Take 0.5-1 tablets by mouth daily as needed for moderate pain.    Taking  . Melatonin 10 MG CAPS Take 10 mg by mouth at bedtime as needed (sleep).     . meloxicam (MOBIC) 15 MG tablet Take 15 mg by mouth daily.     . Multiple Vitamin (MULTIVITAMIN) tablet Take 1 tablet by mouth 2 (two) times daily.    Taking  . Omega-3 Fatty Acids (FISH OIL ULTRA) 1400 MG CAPS Take 1,400 mg by mouth daily.     . Pseudoephedrine-Acetaminophen (THERAFLU SINUS MAX ST PO) Take 1 capsule by mouth daily as needed (cold symtpoms).     [name]  tamsulosin (FLOMAX) 0.4 MG CAPS capsule Take 0.4 mg by mouth 2 (two) times daily.    Taking  . zolpidem (AMBIEN) 10 MG tablet Take 5 mg by mouth at bedtime as needed for sleep. May take an additional 5mg s as needed for sleep if the first dose doesn't fully work   Taking    Inpatient Medications:  . mupirocin ointment      . aspirin  81 mg Oral Pre-Cath  .  ceFAZolin (ANCEF) IV  2 g Intravenous On Call  . chlorhexidine  60 mL Topical Once  . gentamicin irrigation  80 mg Irrigation On Call  . mupirocin ointment  1 application Topical Once    Allergies: No Known Allergies  Social History   Social History  . Marital status: Single    Spouse name: N/A  . Number of children: N/A  . Years of education: N/A      Occupational History  . Not on file.        Social History Main Topics  . Smoking status: Former Smoker     Packs/day: 2.00    Years: 25.00    Types: Cigarettes    Quit date: 12/30/2007  . Smokeless tobacco: Never Used  . Alcohol use No  . Drug use: No  . Sexual activity: Not on file       Other Topics Concern  . Not on file      Social History Narrative   Tobacco use 1 1/2 ppd for 35 years- stopped 2010   No alcohol    No illicit drugs   Single    2 children          Family History  Problem Relation Age of Onset  . Mitral valve prolapse Father      Review of Systems: All other systems reviewed and are otherwise negative except as noted above.  Physical Exam:    Vitals:   08/09/16 0841  BP: (!) 152/92  Pulse: 64  Resp: 18  Temp: 97.7 F (36.5 C)  TempSrc: Oral  SpO2: 99%  Weight: 185 lb (83.9 kg)  Height: 6' (1.829 m)    GEN- The patient is well appearing, alert and oriented x 3 today.   HEENT: normocephalic, atraumatic; sclera clear, conjunctiva pink; hearing intact; oropharynx clear; neck supple, no JVP Lymph- no cervical lymphadenopathy Lungs- Clear to ausculation bilaterally, normal work of breathing.  No wheezes, rales, rhonchi Heart- Regular rate and rhythm, 1/6 SM, rubs or gallops, PMI not laterally displaced GI- soft, non-tender, non-distended Extremities- no clubbing, cyanosis, or edema MS- no significant deformity or atrophy Skin- warm and dry, no rash or lesion Psych- euthymic mood, full affect Neuro- no gross deficits observed  Labs:   Recent Labs       Lab Results  Component Value Date   WBC 8.8 07/30/2016   HGB 14.3 07/30/2016   HCT 43.1 07/30/2016   MCV 96.0 07/30/2016   PLT 224 07/30/2016         EKG: 07/30/16, SR, RBBB  08/07/16: stress myoview  There was no ST segment deviation noted during stress.  This is an intermediate risk study. 1. Non-gated study.  2. 2:1 AV block and possible type 2 2nd degree AV block noted.  3. Fixed small, mild mid to apical anteroseptal perfusion defect. Small area of  infarction versus attenuation (study not gated, cannot assess wall motion). No ischemia.  4. Intermediate risk because of heart block.   07/10/16: TTE Study Conclusions - Left  ventricle: The cavity size was mildly dilated. Wall thickness was normal. Systolic function was mildly to moderately reduced. The estimated ejection fraction was in the range of 40% to 45%. Diffuse hypokinesis. Doppler parameters are consistent with abnormal left ventricular relaxation (grade 1 diastolic dysfunction). - Aortic valve: Bioprosthetic aortic valve appears to function normally. There was no regurgitation. Mean gradient (S): 11 mm Hg. - Aorta: Status post aortic root replacement. Mildly dilated ascending aorta. Aortic root dimension: 41 mm (ED). Ascending aortic diameter: 38 mm (S). - Mitral valve: Mildly calcified annulus. - Right ventricle: The cavity size was mildly to moderately dilated. Systolic function was mildly reduced. - Right atrium: The atrium was mildly dilated. - Pulmonary arteries: No complete TR doppler jet so unable to estimate PA systolic pressure. - Inferior vena cava: The vessel was normal in size. The respirophasic diameter changes were in the normal range (= 50%), consistent with normal central venous pressure. Impressions: - Mildly dilated LV with EF 40-45%, diffuse hypokinesis. Mildly dilated RV with mildly decreased systolic function. Bioprosthetic aortic valve appears to function normally.    Assessment and Plan:   1. Fatigue, exertional intolerance, no syncope     2:1 AV block observed     Pending heart cath, if no CAD to explain conduction disease will plan for PPM  Risks/benefits of PPM implant were discussed with the patient by Dr. Lovena Le, he is agreeable to proceed Pre-op labs reviewed  2. Bicuspid AV (re-do)     functioning well  3. NICM     Stable, no evidence of CHF  4. HTN     Recently off atenolol        Signed, Tommye Standard, PA-C 08/09/2016 9:22 AM     EP Attending  Patient seen and examined. He has high grade AV block with Symptomatic 2:1 AV block. Because of significant ventricular ectopy during his stress test, he has undergone cardiac catheterization which demonstrated no obstructive coronary disease. His left ventricular function is moderately impaired estimated at 40%. We will plan to proceed with insertion of a biventricular pacemaker.  Cristopher Peru, M.D.

## 2016-08-09 NOTE — Interval H&P Note (Signed)
Cath Lab Visit (complete for each Cath Lab visit)  Clinical Evaluation Leading to the Procedure:   ACS: No.  Non-ACS:    Anginal Classification: CCS II  Anti-ischemic medical therapy: Minimal Therapy (1 class of medications)  Non-Invasive Test Results: Intermediate-risk stress test findings: cardiac mortality 1-3%/year  Prior CABG: No previous CABG  Ectopy with exertion.  Heart block. May need pacer.    History and Physical Interval Note:  08/09/2016 10:49 AM  [name]  has presented today for surgery, with the diagnosis of abnormal stress test  The various methods of treatment have been discussed with the patient and family. After consideration of risks, benefits and other options for treatment, the patient has consented to  Procedure(s): Left Heart Cath and Coronary Angiography (N/A) as a surgical intervention .  The patient's history has been reviewed, patient examined, no change in status, stable for surgery.  I have reviewed the patient's chart and labs.  Questions were answered to the patient's satisfaction.     Larae Grooms

## 2016-08-09 NOTE — Discharge Instructions (Signed)
° ° ° ° ° °  Supplemental Discharge Instructions for  Pacemaker/Defibrillator Patients  Activity No heavy lifting or vigorous activity with your left/right arm for 6 to 8 weeks.  Do not raise your left/right arm above your head for one week.  Gradually raise your affected arm as drawn below.             08/13/16                       08/14/16                    08/15/16                  08/16/16 __  NO DRIVING for  1 week   ; you may begin driving on  [id number]   .  WOUND CARE - Keep the wound area clean and dry.  Do not get this area wet for one week. No showers for one week; you may shower on 08/16/16  . - The tape/steri-strips on your wound will fall off; do not pull them off.  No bandage is needed on the site.  DO  NOT apply any creams, oils, or ointments to the wound area. - If you notice any drainage or discharge from the wound, any swelling or bruising at the site, or you develop a fever > 101? F after you are discharged home, call the office at once.  Special Instructions - You are still able to use cellular telephones; use the ear opposite the side where you have your pacemaker/defibrillator.  Avoid carrying your cellular phone near your device. - When traveling through airports, show security personnel your identification card to avoid being screened in the metal detectors.  Ask the security personnel to use the hand wand. - Avoid arc welding equipment, MRI testing (magnetic resonance imaging), TENS units (transcutaneous nerve stimulators).  Call the office for questions about other devices. - Avoid electrical appliances that are in poor condition or are not properly grounded. - Microwave ovens are safe to be near or to operate.  Additional information for defibrillator patients should your device go off: - If your device goes off ONCE and you feel fine afterward, notify the device clinic nurses. - If your device goes off ONCE and you do not feel well afterward, call 911. - If your device  goes off TWICE, call 911. - If your device goes off THREE times in one day, call 911.  DO NOT DRIVE YOURSELF OR A FAMILY MEMBER WITH A DEFIBRILLATOR TO THE HOSPITAL--CALL 911.

## 2016-08-09 NOTE — Discharge Summary (Signed)
ELECTROPHYSIOLOGY PROCEDURE DISCHARGE SUMMARY    Patient ID: [name],  MRN: [id number], DOB/AGE: January 10, 1956 61 y.o.  Admit date: 08/09/2016 Discharge date: 08/10/2016  Primary Care Physician: Nilda Simmer, MD Primary Cardiologist: Dr. Angelena Form Electrophysiologist: Dr. Lovena Le  Primary Discharge Diagnosis:  1. High degree AVblock, 2:1  Secondary Discharge Diagnosis:  1. VHS w/bioprosthetic AVR 2. NICM 3. HTN  No Known Allergies   Procedures This Admission:  1. 08/09/16: LHC, Dr. Irish Lack  The left ventricular ejection fraction is 45-50% by visual estimate.  There is mild aortic valve stenosis, 25 mm Hg gradient on pullback.  Ost RCA to Prox RCA lesion, 25 %stenosed.  Minimal left sided disease.  LV end diastolic pressure is normal. 2.  Implantation of a Medtronic Dual chamber Biventricular PPM on 08/09/16 by Dr Lovena Le.    There were no immediate post procedure complications. 3.  CXR on 08/10/2016 demonstrated no pneumothorax status post device implantation.   Brief HPI: [name] is a 61 y.o. male was referred for LHC and possible PPM secondary to decrease in exertional capacity and noted for 2:1 heart block when he presented for stress testing.  Cath was pursued to evaluate any ischemic etiology for his heart block (his BB had been held previously). Past medical history includes VHD w/AVR, NICM (EF 40-45%), HTN. Risks, benefits, and alternatives to [facility] and PPM implantation were reviewed with the patient who wished to proceed with both procedures.   Hospital Course:  The patient was admitted and underwent LHC without obstructive disease and no intervention followed by implantation of a PPM with details as outlined above.  He was monitored on telemetry overnight which demonstrated NSR with BiV pacing. Cardiac cath site is stable, Left chest was without hematoma or ecchymosis. The device was interrogated and found to be functioning normally. CXR was obtained and  demonstrated no pneumothorax status post device implantation. Wound care, arm mobility, and restrictions post LHC and PPM were reviewed with the patient. His BPs were moderately elevated and he was discharged on  Coreg 6.25mg  BID. The patient was examined by Dr. Cristopher Peru and considered stable for discharge to home.    Physical Exam: Vitals:   08/09/16 1554 08/09/16 2140 08/10/16 0210 08/10/16 0637  BP: (!) 151/89 (!) 157/85 (!) 163/87 131/90  Pulse: 62 66 74 68  Resp: 18 14 15    Temp: 97.9 F (36.6 C) 98.1 F (36.7 C) 98.1 F (36.7 C) 98.7 F (37.1 C)  TempSrc: Oral Oral Oral Oral  SpO2: 100% 94% 93% 98%  Weight: 187 lb 11.2 oz (85.1 kg)   186 lb 9.6 oz (84.6 kg)  Height: 6' (1.829 m)        GEN- The patient is well appearing, alert and oriented x 3 today.   HEENT: normocephalic, atraumatic; sclera clear, conjunctiva pink; hearing intact; oropharynx clear; neck supple, no JVP Lungs- Clear to ausculation bilaterally, normal work of breathing.  No wheezes, rales, rhonchi, no hematoma Heart- Regular rate and rhythm, no murmurs, rubs or gallops, PMI not laterally displaced GI- soft, non-tender, non-distended Extremities- no clubbing, cyanosis, or edema MS- no significant deformity or atrophy Skin- warm and dry, no rash or lesion, left chest without hematoma/ecchymosis Psych- euthymic mood, full affect Neuro- no gross deficits   Labs:   Lab Results  Component Value Date   WBC 5.6 08/09/2016   HGB 13.7 08/09/2016   HCT 38.8 (L) 08/09/2016   MCV 90.0 08/09/2016   PLT 227 08/09/2016  Recent Labs Lab 08/09/16 1539  CREATININE 0.84    Discharge Medications:  Allergies as of 08/10/2016   No Known Allergies     Medication List    TAKE these medications   aspirin EC 81 MG tablet Take 81 mg by mouth daily.   carvedilol 6.25 MG tablet Commonly known as:  COREG Take 1 tablet (6.25 mg total) by mouth 2 (two) times daily with a meal.   FISH OIL ULTRA 1400 MG  Caps Take 1,400 mg by mouth daily.   fluticasone 50 MCG/ACT nasal spray Commonly known as:  FLONASE Place 1-2 sprays into both nostrils 2 (two) times daily as needed for allergies or rhinitis.   HYDROcodone-acetaminophen 5-325 MG tablet Commonly known as:  NORCO/VICODIN Take 0.5-1 tablets by mouth daily as needed for moderate pain.   Melatonin 10 MG Caps Take 10 mg by mouth at bedtime as needed (sleep).   meloxicam 15 MG tablet Commonly known as:  MOBIC Take 15 mg by mouth daily.   multivitamin tablet Take 1 tablet by mouth 2 (two) times daily.   tamsulosin 0.4 MG Caps capsule Commonly known as:  FLOMAX Take 0.4 mg by mouth 2 (two) times daily.   zolpidem 10 MG tablet Commonly known as:  AMBIEN Take 5 mg by mouth at bedtime as needed for sleep. May take an additional 5mg s as needed for sleep if the first dose doesn't fully work       Disposition:  Home   Follow-up Information    Cristopher Peru, MD Follow up on 08/19/2016.   Specialty:  Cardiology Why:  2:30PM, wound check/nursing visit only Contact information: [address] [address] [phone]           Duration of Discharge Encounter: Greater than 30 minutes including physician time.  Signed, 08/10/2016 10:37 AM  EP Attending  Patient seen and examined. Agree with above. His device is working normally. DC home. Usual followup.  Mikle Bosworth.D.

## 2016-08-10 ENCOUNTER — Ambulatory Visit (HOSPITAL_COMMUNITY): Payer: Managed Care, Other (non HMO)

## 2016-08-10 ENCOUNTER — Encounter (HOSPITAL_COMMUNITY): Payer: Self-pay

## 2016-08-10 DIAGNOSIS — I441 Atrioventricular block, second degree: Secondary | ICD-10-CM | POA: Diagnosis not present

## 2016-08-10 MED ORDER — CARVEDILOL 6.25 MG PO TABS: 6.2500 mg | ORAL_TABLET | Freq: Two times a day (BID) | ORAL | 6 refills | Status: DC

## 2016-08-10 NOTE — Progress Notes (Signed)
Pt discharge education provided to wife and pt at bedside. Iv discontinued, catheter intact and telemetry box removed. Pt has all belongings in hand, including pacemaker booklet and box, and discharge paperwork. Pt discharged via wheelchair with volunteer service.   [name]

## 2016-08-12 ENCOUNTER — Telehealth: Payer: Self-pay

## 2016-08-12 ENCOUNTER — Encounter (HOSPITAL_COMMUNITY): Payer: Self-pay | Admitting: Internal Medicine

## 2016-08-12 NOTE — Telephone Encounter (Signed)
Received incoming call from pt. Pt stated he was told he would get a return to work letter at time of d/c for PPM implant. Pt is upset because he did not receive the letter. Informed Dr. Lovena Le will be in the office tomorrow (08/13/16). Pt stated the letter needs to state okay for pt to return to work on 08/19/16. I will forward to Dr. Lovena Le to advise. Informed I will f/u with pt tomorrow.

## 2016-08-13 ENCOUNTER — Telehealth: Payer: Self-pay

## 2016-08-13 NOTE — Telephone Encounter (Signed)
Pt needs note to return to work on 08/19/16. Pt stated he was supposed to get it at d/c from hospital but did not. Will forward to Dr. Lovena Le to advise.

## 2016-08-13 NOTE — Telephone Encounter (Signed)
Called, unable to reach pt. Left voice message return to work letter is ready to be picked up, or I can fax it. Requested return call.

## 2016-08-13 NOTE — Telephone Encounter (Signed)
He may return 1/15 with no limitations. GT

## 2016-08-14 NOTE — Telephone Encounter (Signed)
New Message   Pt returning phone call, and said he can pick up letter.

## 2016-08-15 NOTE — Telephone Encounter (Signed)
Pt walked in to pick up return to work letter.

## 2016-08-19 ENCOUNTER — Ambulatory Visit: Payer: Managed Care, Other (non HMO)

## 2016-08-20 ENCOUNTER — Encounter: Payer: Self-pay | Admitting: *Deleted

## 2016-08-20 ENCOUNTER — Ambulatory Visit (INDEPENDENT_AMBULATORY_CARE_PROVIDER_SITE_OTHER): Payer: Managed Care, Other (non HMO) | Admitting: *Deleted

## 2016-08-20 DIAGNOSIS — I428 Other cardiomyopathies: Secondary | ICD-10-CM | POA: Diagnosis not present

## 2016-08-20 DIAGNOSIS — I441 Atrioventricular block, second degree: Secondary | ICD-10-CM | POA: Diagnosis not present

## 2016-08-20 NOTE — Progress Notes (Signed)
Wound check appointment. Steri-strips removed. Wound without redness or edema. Incision edges approximated, wound well healed. Normal device function. Thresholds, sensing, and impedances consistent with implant measurements. Device programmed at 3.5V for extra safety margin until 3 month visit. Histogram distribution appropriate for patient and level of activity. No mode switches or high ventricular rates noted. Patient educated about wound care, arm mobility, lifting restrictions. ROV in 11/2016 w. GT

## 2016-08-29 ENCOUNTER — Encounter (HOSPITAL_COMMUNITY): Payer: Self-pay

## 2016-08-29 ENCOUNTER — Emergency Department (HOSPITAL_COMMUNITY): Payer: Managed Care, Other (non HMO)

## 2016-08-29 ENCOUNTER — Emergency Department (HOSPITAL_COMMUNITY)
Admission: EM | Admit: 2016-08-29 | Discharge: 2016-08-29 | Disposition: A | Discharge status: Home / Self Care | Payer: Managed Care, Other (non HMO) | Attending: Emergency Medicine | Admitting: Emergency Medicine

## 2016-08-29 DIAGNOSIS — Z87891 Personal history of nicotine dependence: Secondary | ICD-10-CM | POA: Diagnosis not present

## 2016-08-29 DIAGNOSIS — Z79899 Other long term (current) drug therapy: Secondary | ICD-10-CM | POA: Insufficient documentation

## 2016-08-29 DIAGNOSIS — Z8551 Personal history of malignant neoplasm of bladder: Secondary | ICD-10-CM | POA: Insufficient documentation

## 2016-08-29 DIAGNOSIS — Z041 Encounter for examination and observation following transport accident: Secondary | ICD-10-CM | POA: Insufficient documentation

## 2016-08-29 DIAGNOSIS — Y939 Activity, unspecified: Secondary | ICD-10-CM | POA: Insufficient documentation

## 2016-08-29 DIAGNOSIS — Z7982 Long term (current) use of aspirin: Secondary | ICD-10-CM | POA: Diagnosis not present

## 2016-08-29 DIAGNOSIS — Y999 Unspecified external cause status: Secondary | ICD-10-CM | POA: Diagnosis not present

## 2016-08-29 DIAGNOSIS — Y92410 Unspecified street and highway as the place of occurrence of the external cause: Secondary | ICD-10-CM | POA: Insufficient documentation

## 2016-08-29 DIAGNOSIS — I10 Essential (primary) hypertension: Secondary | ICD-10-CM | POA: Insufficient documentation

## 2016-08-29 MED ORDER — ACETAMINOPHEN 325 MG PO TABS
650.0000 mg | ORAL_TABLET | Freq: Once | ORAL | Status: AC
  Administered 2016-08-29: 650 mg via ORAL
  Filled 2016-08-29: qty 2
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
## 2016-09-03 DIAGNOSIS — K760 Fatty (change of) liver, not elsewhere classified: Secondary | ICD-10-CM | POA: Insufficient documentation

## 2016-09-25 ENCOUNTER — Emergency Department (HOSPITAL_COMMUNITY): Payer: Managed Care, Other (non HMO)

## 2016-09-25 ENCOUNTER — Encounter (HOSPITAL_COMMUNITY): Payer: Self-pay | Admitting: *Deleted

## 2016-09-25 ENCOUNTER — Telehealth (INDEPENDENT_AMBULATORY_CARE_PROVIDER_SITE_OTHER): Payer: Self-pay | Admitting: *Deleted

## 2016-09-25 ENCOUNTER — Observation Stay (HOSPITAL_COMMUNITY)
Admission: EM | Admit: 2016-09-25 | Discharge: 2016-09-27 | Disposition: A | Discharge status: Home / Self Care | Payer: Managed Care, Other (non HMO) | Attending: Internal Medicine | Admitting: Internal Medicine

## 2016-09-25 DIAGNOSIS — Z8551 Personal history of malignant neoplasm of bladder: Secondary | ICD-10-CM | POA: Insufficient documentation

## 2016-09-25 DIAGNOSIS — Q231 Congenital insufficiency of aortic valve: Secondary | ICD-10-CM | POA: Insufficient documentation

## 2016-09-25 DIAGNOSIS — S32401A Unspecified fracture of right acetabulum, initial encounter for closed fracture: Secondary | ICD-10-CM | POA: Diagnosis not present

## 2016-09-25 DIAGNOSIS — I5022 Chronic systolic (congestive) heart failure: Secondary | ICD-10-CM | POA: Diagnosis not present

## 2016-09-25 DIAGNOSIS — I428 Other cardiomyopathies: Secondary | ICD-10-CM | POA: Diagnosis not present

## 2016-09-25 DIAGNOSIS — K429 Umbilical hernia without obstruction or gangrene: Secondary | ICD-10-CM | POA: Diagnosis not present

## 2016-09-25 DIAGNOSIS — I110 Hypertensive heart disease with heart failure: Secondary | ICD-10-CM | POA: Diagnosis not present

## 2016-09-25 DIAGNOSIS — I10 Essential (primary) hypertension: Secondary | ICD-10-CM | POA: Diagnosis present

## 2016-09-25 DIAGNOSIS — Z950 Presence of cardiac pacemaker: Secondary | ICD-10-CM | POA: Diagnosis not present

## 2016-09-25 DIAGNOSIS — T1490XA Injury, unspecified, initial encounter: Secondary | ICD-10-CM

## 2016-09-25 DIAGNOSIS — I441 Atrioventricular block, second degree: Secondary | ICD-10-CM | POA: Insufficient documentation

## 2016-09-25 DIAGNOSIS — I708 Atherosclerosis of other arteries: Secondary | ICD-10-CM | POA: Insufficient documentation

## 2016-09-25 DIAGNOSIS — Y92481 Parking lot as the place of occurrence of the external cause: Secondary | ICD-10-CM | POA: Diagnosis not present

## 2016-09-25 DIAGNOSIS — M8580 Other specified disorders of bone density and structure, unspecified site: Secondary | ICD-10-CM | POA: Diagnosis not present

## 2016-09-25 DIAGNOSIS — S3219XA Other fracture of sacrum, initial encounter for closed fracture: Secondary | ICD-10-CM | POA: Insufficient documentation

## 2016-09-25 DIAGNOSIS — Z87891 Personal history of nicotine dependence: Secondary | ICD-10-CM | POA: Diagnosis not present

## 2016-09-25 DIAGNOSIS — I4510 Unspecified right bundle-branch block: Secondary | ICD-10-CM | POA: Diagnosis not present

## 2016-09-25 DIAGNOSIS — Y9355 Activity, bike riding: Secondary | ICD-10-CM | POA: Insufficient documentation

## 2016-09-25 DIAGNOSIS — S32599A Other specified fracture of unspecified pubis, initial encounter for closed fracture: Secondary | ICD-10-CM | POA: Diagnosis present

## 2016-09-25 DIAGNOSIS — S32591A Other specified fracture of right pubis, initial encounter for closed fracture: Secondary | ICD-10-CM | POA: Diagnosis not present

## 2016-09-25 DIAGNOSIS — I429 Cardiomyopathy, unspecified: Secondary | ICD-10-CM | POA: Diagnosis not present

## 2016-09-25 DIAGNOSIS — S32501A Unspecified fracture of right pubis, initial encounter for closed fracture: Secondary | ICD-10-CM | POA: Diagnosis not present

## 2016-09-25 DIAGNOSIS — Z7982 Long term (current) use of aspirin: Secondary | ICD-10-CM | POA: Diagnosis not present

## 2016-09-25 DIAGNOSIS — S32491A Other specified fracture of right acetabulum, initial encounter for closed fracture: Secondary | ICD-10-CM | POA: Diagnosis not present

## 2016-09-25 DIAGNOSIS — M25551 Pain in right hip: Secondary | ICD-10-CM | POA: Diagnosis present

## 2016-09-25 DIAGNOSIS — S32409A Unspecified fracture of unspecified acetabulum, initial encounter for closed fracture: Secondary | ICD-10-CM | POA: Diagnosis present

## 2016-09-25 DIAGNOSIS — Z953 Presence of xenogenic heart valve: Secondary | ICD-10-CM

## 2016-09-25 DIAGNOSIS — S32509A Unspecified fracture of unspecified pubis, initial encounter for closed fracture: Secondary | ICD-10-CM | POA: Diagnosis present

## 2016-09-25 LAB — CBC WITH DIFFERENTIAL/PLATELET
BASOS PCT: 0 %
Basophils Absolute: 0 10*3/uL (ref 0.0–0.1)
Eosinophils Absolute: 0.4 10*3/uL (ref 0.0–0.7)
Eosinophils Relative: 4 %
HEMATOCRIT: 39.3 % (ref 39.0–52.0)
HEMOGLOBIN: 13.8 g/dL (ref 13.0–17.0)
Lymphocytes Relative: 18 %
Lymphs Abs: 1.6 10*3/uL (ref 0.7–4.0)
MCH: 32.2 pg (ref 26.0–34.0)
MCHC: 35.1 g/dL (ref 30.0–36.0)
MCV: 91.8 fL (ref 78.0–100.0)
MONOS PCT: 6 %
Monocytes Absolute: 0.6 10*3/uL (ref 0.1–1.0)
NEUTROS PCT: 72 %
Neutro Abs: 6.2 10*3/uL (ref 1.7–7.7)
Platelets: 198 10*3/uL (ref 150–400)
RBC: 4.28 MIL/uL (ref 4.22–5.81)
RDW: 13.7 % (ref 11.5–15.5)
WBC: 8.8 10*3/uL (ref 4.0–10.5)

## 2016-09-25 LAB — COMPREHENSIVE METABOLIC PANEL
ALBUMIN: 3.9 g/dL (ref 3.5–5.0)
ALK PHOS: 48 U/L (ref 38–126)
ALT: 34 U/L (ref 17–63)
ANION GAP: 8 (ref 5–15)
AST: 40 U/L (ref 15–41)
BILIRUBIN TOTAL: 0.8 mg/dL (ref 0.3–1.2)
BUN: 12 mg/dL (ref 6–20)
CALCIUM: 9.4 mg/dL (ref 8.9–10.3)
CO2: 25 mmol/L (ref 22–32)
Chloride: 105 mmol/L (ref 101–111)
Creatinine, Ser: 0.99 mg/dL (ref 0.61–1.24)
GFR calc Af Amer: >60 mL/min (ref >60)
GFR calc non Af Amer: >60 mL/min (ref >60)
GLUCOSE: 109 mg/dL — AB (ref 65–99)
Potassium: 4.1 mmol/L (ref 3.5–5.1)
Sodium: 138 mmol/L (ref 135–145)
TOTAL PROTEIN: 6.8 g/dL (ref 6.5–8.1)

## 2016-09-25 LAB — TYPE AND SCREEN
ABO/RH(D): A POS
Antibody Screen: NEGATIVE

## 2016-09-25 LAB — PROTIME-INR
INR: 1.09
Prothrombin Time: 14.1 seconds (ref 11.4–15.2)

## 2016-09-25 MED ORDER — MORPHINE SULFATE (PF) 4 MG/ML IV SOLN
4.0000 mg | Freq: Once | INTRAVENOUS | Status: AC
  Administered 2016-09-25: 4 mg via INTRAVENOUS
  Filled 2016-09-25: qty 1

## 2016-09-25 MED ORDER — IOPAMIDOL (ISOVUE-300) INJECTION 61%
INTRAVENOUS | Status: AC
  Administered 2016-09-25: 100 mL
  Filled 2016-09-25: qty 100

## 2016-09-25 MED ORDER — POLYETHYLENE GLYCOL 3350 17 G PO PACK: 17.0000 g | PACK | Freq: Every day | ORAL | Status: DC | PRN

## 2016-09-25 MED ORDER — ONDANSETRON HCL 4 MG PO TABS: 4.0000 mg | ORAL_TABLET | Freq: Four times a day (QID) | ORAL | Status: DC | PRN

## 2016-09-25 MED ORDER — ACETAMINOPHEN 650 MG RE SUPP: 650.0000 mg | Freq: Four times a day (QID) | RECTAL | Status: DC | PRN

## 2016-09-25 MED ORDER — ONDANSETRON HCL 4 MG/2ML IJ SOLN: 4.0000 mg | Freq: Four times a day (QID) | INTRAMUSCULAR | Status: DC | PRN

## 2016-09-25 MED ORDER — TAMSULOSIN HCL 0.4 MG PO CAPS
0.4000 mg | ORAL_CAPSULE | Freq: Two times a day (BID) | ORAL | Status: DC
  Administered 2016-09-25 – 2016-09-27 (×4): 0.4 mg via ORAL
  Filled 2016-09-25 (×4): qty 1

## 2016-09-25 MED ORDER — SODIUM CHLORIDE 0.9 % IV SOLN
INTRAVENOUS | Status: AC
  Administered 2016-09-25: 22:00:00 via INTRAVENOUS

## 2016-09-25 MED ORDER — SODIUM CHLORIDE 0.9 % IV BOLUS (SEPSIS)
1000.0000 mL | Freq: Once | INTRAVENOUS | Status: AC
  Administered 2016-09-25: 1000 mL via INTRAVENOUS

## 2016-09-25 MED ORDER — TRAMADOL HCL 50 MG PO TABS
50.0000 mg | ORAL_TABLET | Freq: Four times a day (QID) | ORAL | Status: AC
  Administered 2016-09-26 (×4): 50 mg via ORAL
  Filled 2016-09-25 (×4): qty 1

## 2016-09-25 MED ORDER — METHOCARBAMOL 500 MG PO TABS
500.0000 mg | ORAL_TABLET | Freq: Four times a day (QID) | ORAL | Status: DC | PRN
  Administered 2016-09-25 – 2016-09-27 (×6): 500 mg via ORAL
  Filled 2016-09-25 (×6): qty 1

## 2016-09-25 MED ORDER — HYDROCODONE-ACETAMINOPHEN 5-325 MG PO TABS
0.5000 | ORAL_TABLET | Freq: Four times a day (QID) | ORAL | Status: DC | PRN
  Administered 2016-09-25 – 2016-09-27 (×7): 1 via ORAL
  Filled 2016-09-25 (×7): qty 1

## 2016-09-25 MED ORDER — ATENOLOL 12.5 MG HALF TABLET
12.5000 mg | ORAL_TABLET | Freq: Two times a day (BID) | ORAL | Status: DC
  Administered 2016-09-25 – 2016-09-27 (×3): 12.5 mg via ORAL
  Filled 2016-09-25 (×4): qty 1

## 2016-09-25 MED ORDER — ZOLPIDEM TARTRATE 5 MG PO TABS
5.0000 mg | ORAL_TABLET | Freq: Every evening | ORAL | Status: DC | PRN
  Administered 2016-09-26 – 2016-09-27 (×2): 5 mg via ORAL
  Filled 2016-09-25 (×2): qty 1

## 2016-09-25 MED ORDER — METHOCARBAMOL 1000 MG/10ML IJ SOLN
500.0000 mg | Freq: Four times a day (QID) | INTRAVENOUS | Status: DC | PRN
  Filled 2016-09-25: qty 5

## 2016-09-25 MED ORDER — ACETAMINOPHEN 325 MG PO TABS: 650.0000 mg | ORAL_TABLET | Freq: Four times a day (QID) | ORAL | Status: DC | PRN

## 2016-09-25 MED ORDER — SENNA 8.6 MG PO TABS
1.0000 | ORAL_TABLET | Freq: Two times a day (BID) | ORAL | Status: DC
  Administered 2016-09-25 – 2016-09-27 (×4): 8.6 mg via ORAL
  Filled 2016-09-25 (×4): qty 1
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
## 2016-09-25 NOTE — Telephone Encounter (Signed)
FYI

## 2016-09-25 NOTE — ED Notes (Signed)
Pt back from CT

## 2016-09-25 NOTE — Progress Notes (Signed)
Orthopedic Tech Progress Note Patient Details:  [name] [birth year] [id number]  Patient ID: [name], male   DOB: August 04, 1956, 61 y.o.   MRN: [id number]   [name] 09/25/2016, 12:51 PM Made level 2 trauma visit
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
## 2016-09-25 NOTE — Telephone Encounter (Signed)
Please call her back sounds good thanks
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
## 2016-09-26 DIAGNOSIS — S32401A Unspecified fracture of right acetabulum, initial encounter for closed fracture: Secondary | ICD-10-CM

## 2016-09-26 DIAGNOSIS — S32591A Other specified fracture of right pubis, initial encounter for closed fracture: Secondary | ICD-10-CM | POA: Diagnosis not present

## 2016-09-26 LAB — COMPREHENSIVE METABOLIC PANEL
ALBUMIN: 3.4 g/dL — AB (ref 3.5–5.0)
ALT: 28 U/L (ref 17–63)
ANION GAP: 7 (ref 5–15)
AST: 35 U/L (ref 15–41)
Alkaline Phosphatase: 45 U/L (ref 38–126)
BUN: 8 mg/dL (ref 6–20)
CHLORIDE: 106 mmol/L (ref 101–111)
CO2: 26 mmol/L (ref 22–32)
Calcium: 8.9 mg/dL (ref 8.9–10.3)
Creatinine, Ser: 0.85 mg/dL (ref 0.61–1.24)
GFR calc non Af Amer: >60 mL/min (ref >60)
GLUCOSE: 103 mg/dL — AB (ref 65–99)
Potassium: 3.8 mmol/L (ref 3.5–5.1)
SODIUM: 139 mmol/L (ref 135–145)
Total Bilirubin: 1.2 mg/dL (ref 0.3–1.2)
Total Protein: 6 g/dL — ABNORMAL LOW (ref 6.5–8.1)

## 2016-09-26 LAB — CBC
HCT: 37.6 % — ABNORMAL LOW (ref 39.0–52.0)
HEMOGLOBIN: 12.8 g/dL — AB (ref 13.0–17.0)
MCH: 31.6 pg (ref 26.0–34.0)
MCHC: 34 g/dL (ref 30.0–36.0)
MCV: 92.8 fL (ref 78.0–100.0)
Platelets: 159 10*3/uL (ref 150–400)
RBC: 4.05 MIL/uL — AB (ref 4.22–5.81)
RDW: 13.9 % (ref 11.5–15.5)
WBC: 6.9 10*3/uL (ref 4.0–10.5)

## 2016-09-26 LAB — TSH: TSH: 2.375 u[IU]/mL (ref 0.350–4.500)

## 2016-09-26 LAB — PHOSPHORUS: Phosphorus: 2.8 mg/dL (ref 2.5–4.6)

## 2016-09-26 LAB — MAGNESIUM: MAGNESIUM: 1.7 mg/dL (ref 1.7–2.4)

## 2016-09-26 LAB — HIV ANTIBODY (ROUTINE TESTING W REFLEX): HIV SCREEN 4TH GENERATION: NONREACTIVE

## 2016-09-26 MED ORDER — HYDROCODONE-ACETAMINOPHEN 5-325 MG PO TABS: 1.0000 | ORAL_TABLET | Freq: Four times a day (QID) | ORAL | 0 refills | Status: DC | PRN

## 2016-09-26 MED ORDER — SENNA 8.6 MG PO TABS: 1.0000 | ORAL_TABLET | Freq: Every day | ORAL | 0 refills | Status: DC | PRN

## 2016-09-26 MED ORDER — METHOCARBAMOL 500 MG PO TABS: 500.0000 mg | ORAL_TABLET | Freq: Four times a day (QID) | ORAL | 0 refills | Status: DC | PRN

## 2016-09-26 MED ORDER — ENOXAPARIN SODIUM 40 MG/0.4ML ~~LOC~~ SOLN
40.0000 mg | SUBCUTANEOUS | Status: DC
  Administered 2016-09-26 – 2016-09-27 (×2): 40 mg via SUBCUTANEOUS
  Filled 2016-09-26 (×2): qty 0.4

## 2016-09-26 MED ORDER — ACETAMINOPHEN 325 MG PO TABS: 650.0000 mg | ORAL_TABLET | Freq: Four times a day (QID) | ORAL | Status: DC | PRN

## 2016-09-26 MED ORDER — HYDROMORPHONE HCL 2 MG/ML IJ SOLN: 1.0000 mg | INTRAMUSCULAR | Status: DC | PRN
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
## 2016-09-26 NOTE — Progress Notes (Signed)
[facility] paged patient bp 98/74 HR 80 Atenolol 12.5 was scheduled to be given. When I told patient pt his bp he stated that at home he would not take it and he refused. When [facility] returned call I informed that pt refused to take medication. Arthor Captain LPN
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
## 2016-09-27 DIAGNOSIS — S32401A Unspecified fracture of right acetabulum, initial encounter for closed fracture: Secondary | ICD-10-CM | POA: Diagnosis not present

## 2016-09-27 LAB — VITAMIN D 25 HYDROXY (VIT D DEFICIENCY, FRACTURES): VIT D 25 HYDROXY: 33.3 ng/mL (ref 30.0–100.0)
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
## 2016-09-27 NOTE — Progress Notes (Signed)
Pt stable - mobilizing ok Ready for dc I added rx for pain meds for next week until I see him

## 2016-09-27 NOTE — Progress Notes (Signed)
Discharge instructions and prescriptions reviewed with patient. Patient stated understanding. IV removed. Patient is waiting for his ride for transportation

## 2016-09-27 NOTE — Progress Notes (Signed)
Late Entry G-Codes from 10-13-16    2016/10/13 1408  PT G-Codes **NOT FOR INPATIENT CLASS**  Functional Assessment Tool Used AM-PAC 6 Clicks Basic Mobility;Clinical judgement  Functional Limitation Mobility: Walking and moving around  Mobility: Walking and Moving Around Current Status [phone]) CJ  Mobility: Walking and Moving Around Goal Status [phone]) CH  Scheryl Marten PT, DPT  [phone]
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
## 2016-10-09 ENCOUNTER — Ambulatory Visit (INDEPENDENT_AMBULATORY_CARE_PROVIDER_SITE_OTHER): Payer: Self-pay | Admitting: Orthopedic Surgery

## 2016-10-09 ENCOUNTER — Ambulatory Visit (INDEPENDENT_AMBULATORY_CARE_PROVIDER_SITE_OTHER): Payer: Managed Care, Other (non HMO)

## 2016-10-09 ENCOUNTER — Encounter (INDEPENDENT_AMBULATORY_CARE_PROVIDER_SITE_OTHER): Payer: Self-pay | Admitting: Orthopedic Surgery

## 2016-10-09 ENCOUNTER — Encounter (INDEPENDENT_AMBULATORY_CARE_PROVIDER_SITE_OTHER): Payer: Self-pay

## 2016-10-09 DIAGNOSIS — S32401D Unspecified fracture of right acetabulum, subsequent encounter for fracture with routine healing: Secondary | ICD-10-CM | POA: Diagnosis not present

## 2016-10-09 DIAGNOSIS — S32591D Other specified fracture of right pubis, subsequent encounter for fracture with routine healing: Secondary | ICD-10-CM

## 2016-10-09 MED ORDER — CYCLOBENZAPRINE HCL 10 MG PO TABS: 10.0000 mg | ORAL_TABLET | Freq: Three times a day (TID) | ORAL | 0 refills | Status: DC | PRN

## 2016-10-09 MED ORDER — HYDROCODONE-ACETAMINOPHEN 5-325 MG PO TABS: 1.0000 | ORAL_TABLET | Freq: Three times a day (TID) | ORAL | 0 refills | Status: DC | PRN

## 2016-10-09 NOTE — Progress Notes (Signed)
Post-Op Visit Note   Patient: [name]           Date of Birth: 15-Sep-1955           MRN: [id number] Visit Date: 10/09/2016 PCP: Nilda Simmer, MD   Assessment & Plan:  Chief Complaint:  Chief Complaint  Patient presents with  . Pelvis - Fracture, Follow-up, Pain   Visit Diagnoses:  1. Closed fracture of ramus of right pubis with routine healing, subsequent encounter   2. Closed nondisplaced fracture of right acetabulum with routine healing, unspecified portion of acetabulum, subsequent encounter     Plan: [name] is a 61 year old patient with right inferior and superior rami fractures which do extend into the acetabulum along with a sacral fracture.  The nonweightbearing right leg.  2 weeks out from injury.  Home health physical therapy comes out.  On exam patient has no pain with range of motion of the hip and good strength.  He does have a bruise on the lateral aspect of the trochanter.  Plan is to refill his Norco and add Flexeril.  Stop the Robaxin.  Out of work for more weeks.  He is okay to be nonweightbearing for 2 more weeks partial weightbearing 50% for that fifth week and then weightbearing as tolerated in the sixth week.  I'll see him back after he is been putting more weight on it during that final week.  Follow-Up Instructions: No Follow-up on file.   Orders:  Orders Placed This Encounter  Procedures  . XR Pelvis 1-2 Views   No orders of the defined types were placed in this encounter.   Imaging: Xr Pelvis 1-2 Views  Result Date: 10/09/2016 AP pelvis and inlet view outlet view reviewed.  Patient has healing inferior and superior rami fractures with extension into the acetabulum.  These fractures are essentially nondisplaced and unchanged from the CT scan done 2 weeks ago.  Remainder of the bony pelvis normal   PMFS History: Patient Active Problem List   Diagnosis Date Noted  . Pubic bone fracture ([place]) 09/25/2016  . Pubic ramus fracture ([place]) 09/25/2016   . Acetabular fracture ([place]) 09/25/2016  . Chronic systolic CHF (congestive heart failure) ([place]) 09/25/2016  . Heart block AV second degree 08/09/2016  . Mobitz type 2 second degree atrioventricular block 08/09/2016  . Abnormal stress test   . Bicuspid aortic valve   . Aortic insufficiency   . S/P aortic valve replacement with bioprosthetic valve   . NICM (nonischemic cardiomyopathy) ([place])   . RBBB   . Essential hypertension   . AORTIC VALVE DISORDERS 01/30/2009   Past Medical History:  Diagnosis Date  . Abnormal LFTs    a. eval in 2017 for this - abd [place] with diffuse hepatocellular disease.  . Aortic insufficiency    a. bicuspid AV/AI s/p bioprosthetic AVR and aortic root replacement 05/2009.  [name] Bicuspid aortic valve   . Bladder cancer ([place])    recurrent bladder tumor   . Dilated aortic root ([name])   . Essential hypertension   . Former consumption of alcohol   . Frequency of urination   . Hepatocellular dysfunction   . History of aortic insufficiency   . NICM (nonischemic cardiomyopathy) ([place])    a. Previous EF 45-50% in 2009 - echo in 2014 reported EF 50-55% but upon review felt to be lower - f/u echo 07/2016 with EF 40-45%. (no CAD by cath in 2010).  . Nocturia   . RBBB   . S/P aortic  valve replacement with bioprosthetic valve 05-10-2009   BY DR Ricard Dillon   CARDIOLOGIST-  DR Angelena Form    Family History  Problem Relation Age of Onset  . Mitral valve prolapse Father     Past Surgical History:  Procedure Laterality Date  . APPENDECTOMY  AGE 12  . BIOLOGICAL BENTALL AORTIC ROOT REPLACEMENT W/ PERICARDIAL TISSUE VALVE AND SYNTHETIC AORTIC GRAFT  05-10-2009  DR OWENS   BICUSPID AV W/ SEVERE AI &  ANEURYSM OF AORTIC ROOT AND PROXIMAL ASCENDING THORACIC AORTA  . CARDIAC CATHETERIZATION  08-10-2008  DR MCALHANY   NO EVIDENCE CAD/ MILD GLOBAL LVSF/ MODERATE AORTIC INSUFFICIENCY/ MILD DILATION OF THE AORTIC ROOT/ EF 45-50%  . CARDIAC CATHETERIZATION N/A 08/09/2016   Procedure: Left  Heart Cath and Coronary Angiography;  Surgeon: Jettie Booze, MD;  Location: [facility];  Service: Cardiovascular;  Laterality: N/A;  . CATARACT EXTRACTION W/ INTRAOCULAR LENS IMPLANT Right 2015  . CYSTOSCOPY W/ RETROGRADES Bilateral 12/31/2012   Procedure: CYSTOSCOPY WITH RETROGRADE PYELOGRAM With CYSTOGRAM AND DEEP BLADDER BIOPSY ;  Surgeon: Molli Hazard, MD;  Location[facility];  Service: Urology;  Laterality: Bilateral;  . CYSTOSCOPY W/ RETROGRADES Bilateral 10/12/2015   Procedure: BILATERAL RETROGRADE PYELOGRAM AND POST OP MITOMYCIN C;  Surgeon: Ardis Hughs, MD;  Location: [facility];  Service: Urology;  Laterality: Bilateral;  . CYSTOSCOPY WITH BIOPSY N/A 02/22/2013   Procedure: CYSTOSCOPY WITH BIOPSY;  Surgeon: Molli Hazard, MD;  Location: [facility];  Service: Urology;  Laterality: N/A;  . CYSTOSCOPY WITH BIOPSY N/A 08/02/2013   Procedure: CYSTOSCOPY WITH BLADDER BIOPSY AND  BILATERAL RETROGRADES;  Surgeon: Molli Hazard, MD;  Location: [facility];  Service: Urology;  Laterality: N/A;  . EP IMPLANTABLE DEVICE N/A 08/09/2016   Procedure: BiV Pacemaker Insertion CRT-P;  Surgeon: Evans Lance, MD;  Location: [place] [facility];  Service: Cardiovascular;  Laterality: N/A;  . HYDROCELE EXCISION / REPAIR  2009  . TRANSTHORACIC ECHOCARDIOGRAM  07-23-2013  DR MCALHANY   mild LVH,  ef 50-55%/   NORMAL FUNCTIONING BIOPROSTHETIC AORTIC VALVE (mean granient 30mmHg, peak gradient 41mmHg)/ 90mm prosthetic aortic root/  trivial PR/  mild RAE/  mild dilated RV  . TRANSURETHRAL RESECTION OF BLADDER TUMOR N/A 12/31/2012   Procedure: TRANSURETHRAL RESECTION OF BLADDER TUMOR (TURBT);  Surgeon: Molli Hazard, MD;  Location: [facility];  Service: Urology;  Laterality: N/A;  . TRANSURETHRAL RESECTION OF BLADDER TUMOR N/A 10/12/2015   Procedure: TRANSURETHRAL RESECTION OF BLADDER TUMOR  (TURBT);  Surgeon: Ardis Hughs, MD;  Location: [facility];  Service: Urology;  Laterality: N/A;   Social History   Occupational History  . Not on file.   Social History Main Topics  . Smoking status: Former Smoker    Packs/day: 2.00    Years: 25.00    Types: Cigarettes    Quit date: 12/30/2007  . Smokeless tobacco: Never Used  . Alcohol use No  . Drug use: No  . Sexual activity: Not on file

## 2016-10-10 ENCOUNTER — Encounter (INDEPENDENT_AMBULATORY_CARE_PROVIDER_SITE_OTHER): Payer: Self-pay

## 2016-10-10 ENCOUNTER — Ambulatory Visit (INDEPENDENT_AMBULATORY_CARE_PROVIDER_SITE_OTHER): Payer: Managed Care, Other (non HMO) | Admitting: Cardiovascular Disease

## 2016-10-10 ENCOUNTER — Encounter: Payer: Self-pay | Admitting: Cardiovascular Disease

## 2016-10-10 VITALS — BP 146/60 | HR 94 | Ht 72.0 in | Wt 185.0 lb

## 2016-10-10 DIAGNOSIS — Q231 Congenital insufficiency of aortic valve: Secondary | ICD-10-CM | POA: Diagnosis not present

## 2016-10-10 DIAGNOSIS — I10 Essential (primary) hypertension: Secondary | ICD-10-CM

## 2016-10-10 DIAGNOSIS — I441 Atrioventricular block, second degree: Secondary | ICD-10-CM

## 2016-10-10 DIAGNOSIS — I2510 Atherosclerotic heart disease of native coronary artery without angina pectoris: Secondary | ICD-10-CM | POA: Diagnosis not present

## 2016-10-10 DIAGNOSIS — Z953 Presence of xenogenic heart valve: Secondary | ICD-10-CM

## 2016-10-10 MED ORDER — CARVEDILOL 3.125 MG PO TABS: 3.1250 mg | ORAL_TABLET | Freq: Two times a day (BID) | ORAL | 3 refills | Status: DC
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
## 2016-10-10 NOTE — Patient Instructions (Signed)
Medication Instructions:  Your physician has recommended you make the following change in your medication:  Stop atenolol Start Coreg 3.125 mg by mouth twice daily.   Labwork: none  Testing/Procedures: none  Follow-Up: Your physician recommends that you schedule a follow-up appointment in: 12 months. Please call our office in about 9 months to schedule this appointment    Any Other Special Instructions Will Be Listed Below (If Applicable).     If you need a refill on your cardiac medications before your next appointment, please call your pharmacy.

## 2016-10-22 ENCOUNTER — Other Ambulatory Visit: Payer: Self-pay | Admitting: Urology

## 2016-10-22 NOTE — Patient Instructions (Signed)
JOHNATHIN VANDERSCHAAF  10/22/2016   Your procedure is scheduled on: 10/25/2016    Report to [facility] Main  Entrance take [place]  elevators to 3rd floor to  [place] at    0525 AM.  Call this number if you have problems the morning of surgery [phone]   Remember: ONLY 1 PERSON MAY GO WITH YOU TO SHORT STAY TO GET  READY MORNING OF [place].  Do not eat food or drink liquids :After Midnight.     Take these medicines the morning of surgery with A SIP OF WATER: Carvedilol ( coreg), Hydrocodone if needed, Tamsulosin ( Flomax)                                 You may not have any metal on your body including hair pins and              piercings  Do not wear jewelry,  lotions, powders or perfumes, deodorant                         Men may shave face and neck.   Do not bring valuables to the hospital. [place].  Contacts, dentures or bridgework may not be worn into surgery. .     Patients discharged the day of surgery will not be allowed to drive home.  Name and phone number of your driver:                Please read over the following fact sheets you were given: _____________________________________________________________________             [facility] - Preparing for Surgery Before surgery, you can play an important role.  Because skin is not sterile, your skin needs to be as free of germs as possible.  You can reduce the number of germs on your skin by washing with CHG (chlorahexidine gluconate) soap before surgery.  CHG is an antiseptic cleaner which kills germs and bonds with the skin to continue killing germs even after washing. Please DO NOT use if you have an allergy to CHG or antibacterial soaps.  If your skin becomes reddened/irritated stop using the CHG and inform your nurse when you arrive at Short Stay. Do not shave (including legs and underarms) for at least 48 hours prior to the  first CHG shower.  You may shave your face/neck. Please follow these instructions carefully:  1.  Shower with CHG Soap the night before surgery and the  morning of Surgery.  2.  If you choose to wash your hair, wash your hair first as usual with your  normal  shampoo.  3.  After you shampoo, rinse your hair and body thoroughly to remove the  shampoo.                           4.  Use CHG as you would any other liquid soap.  You can apply chg directly  to the skin and wash                       Gently with a scrungie or clean washcloth.  5.  Apply the CHG Soap to your body ONLY FROM THE NECK DOWN.   Do not use on face/ open                           Wound or open sores. Avoid contact with eyes, ears mouth and genitals (private parts).                       Wash face,  Genitals (private parts) with your normal soap.             6.  Wash thoroughly, paying special attention to the area where your surgery  will be performed.  7.  Thoroughly rinse your body with warm water from the neck down.  8.  DO NOT shower/wash with your normal soap after using and rinsing off  the CHG Soap.                9.  Pat yourself dry with a clean towel.            10.  Wear clean pajamas.            11.  Place clean sheets on your bed the night of your first shower and do not  sleep with pets. Day of Surgery : Do not apply any lotions/deodorants the morning of surgery.  Please wear clean clothes to the hospital/surgery center.  FAILURE TO FOLLOW THESE INSTRUCTIONS MAY RESULT IN THE CANCELLATION OF YOUR SURGERY PATIENT SIGNATURE_________________________________  NURSE SIGNATURE__________________________________  ________________________________________________________________________

## 2016-10-22 NOTE — Progress Notes (Signed)
EKG-09/26/2016-epic  08/09/2016- BiV Pacer Insertion -epic  08/09/16- Cardiac Catheterizaton - epic  08/07/2016-Stress Test- epic  07/10/16- Echo- [facility] [place]- 10/10/2016-epic  CXR-08/10/16-EPIC

## 2016-10-23 ENCOUNTER — Encounter (HOSPITAL_COMMUNITY): Payer: Self-pay

## 2016-10-23 ENCOUNTER — Encounter (HOSPITAL_COMMUNITY)
Admission: RE | Admit: 2016-10-23 | Discharge: 2016-10-23 | Disposition: A | Discharge status: Home / Self Care | Payer: Managed Care, Other (non HMO) | Source: Ambulatory Visit | Attending: Urology | Admitting: Urology

## 2016-10-23 DIAGNOSIS — S329XXD Fracture of unspecified parts of lumbosacral spine and pelvis, subsequent encounter for fracture with routine healing: Secondary | ICD-10-CM | POA: Diagnosis not present

## 2016-10-23 DIAGNOSIS — Z950 Presence of cardiac pacemaker: Secondary | ICD-10-CM | POA: Diagnosis not present

## 2016-10-23 DIAGNOSIS — Z87891 Personal history of nicotine dependence: Secondary | ICD-10-CM | POA: Diagnosis not present

## 2016-10-23 DIAGNOSIS — F329 Major depressive disorder, single episode, unspecified: Secondary | ICD-10-CM | POA: Diagnosis not present

## 2016-10-23 DIAGNOSIS — C670 Malignant neoplasm of trigone of bladder: Secondary | ICD-10-CM | POA: Diagnosis present

## 2016-10-23 DIAGNOSIS — I4510 Unspecified right bundle-branch block: Secondary | ICD-10-CM | POA: Diagnosis not present

## 2016-10-23 DIAGNOSIS — Z79899 Other long term (current) drug therapy: Secondary | ICD-10-CM | POA: Diagnosis not present

## 2016-10-23 DIAGNOSIS — R51 Headache: Secondary | ICD-10-CM | POA: Diagnosis not present

## 2016-10-23 DIAGNOSIS — D090 Carcinoma in situ of bladder: Secondary | ICD-10-CM | POA: Diagnosis not present

## 2016-10-23 DIAGNOSIS — Z8551 Personal history of malignant neoplasm of bladder: Secondary | ICD-10-CM | POA: Diagnosis not present

## 2016-10-23 DIAGNOSIS — Z952 Presence of prosthetic heart valve: Secondary | ICD-10-CM | POA: Diagnosis not present

## 2016-10-23 DIAGNOSIS — N3289 Other specified disorders of bladder: Secondary | ICD-10-CM | POA: Diagnosis not present

## 2016-10-23 HISTORY — DX: Cardiac murmur, unspecified: R01.1

## 2016-10-23 HISTORY — DX: Headache: R51

## 2016-10-23 HISTORY — DX: Presence of cardiac pacemaker: Z95.0

## 2016-10-23 HISTORY — DX: Headache, unspecified: R51.9

## 2016-10-23 LAB — CBC
HEMATOCRIT: 40.4 % (ref 39.0–52.0)
HEMOGLOBIN: 13.9 g/dL (ref 13.0–17.0)
MCH: 31.3 pg (ref 26.0–34.0)
MCHC: 34.4 g/dL (ref 30.0–36.0)
MCV: 91 fL (ref 78.0–100.0)
PLATELETS: 223 10*3/uL (ref 150–400)
RBC: 4.44 MIL/uL (ref 4.22–5.81)
RDW: 13.7 % (ref 11.5–15.5)
WBC: 7.5 10*3/uL (ref 4.0–10.5)

## 2016-10-23 LAB — BASIC METABOLIC PANEL
ANION GAP: 7 (ref 5–15)
BUN: 22 mg/dL — ABNORMAL HIGH (ref 6–20)
CO2: 26 mmol/L (ref 22–32)
Calcium: 10 mg/dL (ref 8.9–10.3)
Chloride: 107 mmol/L (ref 101–111)
Creatinine, Ser: 1.01 mg/dL (ref 0.61–1.24)
GFR calc non Af Amer: >60 mL/min (ref >60)
Glucose, Bld: 93 mg/dL (ref 65–99)
POTASSIUM: 4.1 mmol/L (ref 3.5–5.1)
SODIUM: 140 mmol/L (ref 135–145)

## 2016-10-24 ENCOUNTER — Encounter (HOSPITAL_COMMUNITY): Payer: Self-pay | Admitting: Anesthesiology

## 2016-10-24 NOTE — Anesthesia Preprocedure Evaluation (Addendum)
Anesthesia Evaluation  Patient identified by MRN, date of birth, ID band Patient awake    Reviewed: Allergy & Precautions, H&P , NPO status , Patient's Chart, lab work & pertinent test results, reviewed documented beta blocker date and time   Airway Mallampati: I  TM Distance: >3 FB Neck ROM: Full    Dental  (+) Teeth Intact, Dental Advisory Given   Pulmonary former smoker,    Pulmonary exam normal breath sounds clear to auscultation       Cardiovascular Exercise Tolerance: Good hypertension, + Peripheral Vascular Disease and +CHF  Normal cardiovascular exam+ dysrhythmias + pacemaker + Valvular Problems/Murmurs  Rhythm:Regular Rate:Normal  S/P AVR 2010, most recent Echo stable with normally functioning valve  ECG: RBBB   Neuro/Psych  Headaches, PSYCHIATRIC DISORDERS Depression negative neurological ROS  negative psych ROS   GI/Hepatic negative GI ROS, Neg liver ROS,   Endo/Other  negative endocrine ROS  Renal/GU negative Renal ROS  negative genitourinary   Musculoskeletal negative musculoskeletal ROS (+)   Abdominal   Peds negative pediatric ROS (+)  Hematology negative hematology ROS (+)   Anesthesia Other Findings   Reproductive/Obstetrics negative OB ROS                           Lab Results  Component Value Date   WBC 7.5 10/23/2016   HGB 13.9 10/23/2016   HCT 40.4 10/23/2016   MCV 91.0 10/23/2016   PLT 223 10/23/2016   Lab Results  Component Value Date   CREATININE 1.01 10/23/2016   BUN 22 (H) 10/23/2016   NA 140 10/23/2016   K 4.1 10/23/2016   CL 107 10/23/2016   CO2 26 10/23/2016   Lab Results  Component Value Date   INR 1.09 09/25/2016   INR 0.96 08/09/2016   INR 1.51 (H) 05/10/2009   Echo: Left ventricle: The cavity size was mildly dilated. Wall   thickness was normal. Systolic function was mildly to moderately   reduced. The estimated ejection fraction was in  the range of 40%   to 45%. Diffuse hypokinesis. Doppler parameters are consistent   with abnormal left ventricular relaxation (grade 1 diastolic   dysfunction). - Aortic valve: Bioprosthetic aortic valve appears to function   normally. There was no regurgitation. Mean gradient (S): 11 mm   Hg. - Aorta: Status post aortic root replacement. Mildly dilated   ascending aorta. Aortic root dimension: 41 mm (ED). Ascending   aortic diameter: 38 mm (S). - Mitral valve: Mildly calcified annulus. - Right ventricle: The cavity size was mildly to moderately   dilated. Systolic function was mildly reduced. - Right atrium: The atrium was mildly dilated. - Pulmonary arteries: No complete TR doppler jet so unable to   estimate PA systolic pressure. - Inferior vena cava: The vessel was normal in size. The   respirophasic diameter changes were in the normal range (= 50%),   consistent with normal central venous pressure.  Anesthesia Physical  Anesthesia Plan  ASA: III  Anesthesia Plan: General   Post-op Pain Management:    Induction: Intravenous  Airway Management Planned: LMA  Additional Equipment:   Intra-op Plan:   Post-operative Plan: Extubation in OR  Informed Consent: I have reviewed the patients History and Physical, chart, labs and discussed the procedure including the risks, benefits and alternatives for the proposed anesthesia with the patient or authorized representative who has indicated his/her understanding and acceptance.   Dental advisory given  Plan Discussed  with: CRNA  Anesthesia Plan Comments:         Anesthesia Quick Evaluation

## 2016-10-25 ENCOUNTER — Encounter (HOSPITAL_COMMUNITY)
Admission: RE | Disposition: A | Discharge status: Home / Self Care | Payer: Self-pay | Source: Ambulatory Visit | Attending: Urology

## 2016-10-25 ENCOUNTER — Ambulatory Visit (HOSPITAL_COMMUNITY): Payer: Managed Care, Other (non HMO)

## 2016-10-25 ENCOUNTER — Ambulatory Visit (HOSPITAL_COMMUNITY)
Admission: RE | Admit: 2016-10-25 | Discharge: 2016-10-25 | Disposition: A | Discharge status: Home / Self Care | Payer: Managed Care, Other (non HMO) | Source: Ambulatory Visit | Attending: Urology | Admitting: Urology

## 2016-10-25 ENCOUNTER — Encounter (HOSPITAL_COMMUNITY): Payer: Self-pay | Admitting: *Deleted

## 2016-10-25 ENCOUNTER — Ambulatory Visit (HOSPITAL_COMMUNITY): Payer: Managed Care, Other (non HMO) | Admitting: Anesthesiology

## 2016-10-25 DIAGNOSIS — Z87891 Personal history of nicotine dependence: Secondary | ICD-10-CM | POA: Insufficient documentation

## 2016-10-25 DIAGNOSIS — Z950 Presence of cardiac pacemaker: Secondary | ICD-10-CM | POA: Insufficient documentation

## 2016-10-25 DIAGNOSIS — Z952 Presence of prosthetic heart valve: Secondary | ICD-10-CM | POA: Insufficient documentation

## 2016-10-25 DIAGNOSIS — R51 Headache: Secondary | ICD-10-CM | POA: Insufficient documentation

## 2016-10-25 DIAGNOSIS — D090 Carcinoma in situ of bladder: Secondary | ICD-10-CM | POA: Insufficient documentation

## 2016-10-25 DIAGNOSIS — F329 Major depressive disorder, single episode, unspecified: Secondary | ICD-10-CM | POA: Insufficient documentation

## 2016-10-25 DIAGNOSIS — S329XXD Fracture of unspecified parts of lumbosacral spine and pelvis, subsequent encounter for fracture with routine healing: Secondary | ICD-10-CM | POA: Insufficient documentation

## 2016-10-25 DIAGNOSIS — C678 Malignant neoplasm of overlapping sites of bladder: Secondary | ICD-10-CM

## 2016-10-25 DIAGNOSIS — Z8551 Personal history of malignant neoplasm of bladder: Secondary | ICD-10-CM | POA: Insufficient documentation

## 2016-10-25 DIAGNOSIS — Z79899 Other long term (current) drug therapy: Secondary | ICD-10-CM | POA: Insufficient documentation

## 2016-10-25 DIAGNOSIS — I4510 Unspecified right bundle-branch block: Secondary | ICD-10-CM | POA: Insufficient documentation

## 2016-10-25 DIAGNOSIS — N3289 Other specified disorders of bladder: Secondary | ICD-10-CM | POA: Insufficient documentation

## 2016-10-25 DIAGNOSIS — Z419 Encounter for procedure for purposes other than remedying health state, unspecified: Secondary | ICD-10-CM

## 2016-10-25 HISTORY — PX: TRANSURETHRAL RESECTION OF BLADDER TUMOR WITH MITOMYCIN-C: SHX6459

## 2016-10-25 HISTORY — PX: CYSTOSCOPY W/ RETROGRADES: SHX1426

## 2016-10-25 SURGERY — TRANSURETHRAL RESECTION OF BLADDER TUMOR WITH MITOMYCIN-C
Anesthesia: General | Site: Urethra

## 2016-10-25 MED ORDER — IOHEXOL 300 MG/ML  SOLN
INTRAMUSCULAR | Status: DC | PRN
  Administered 2016-10-25: 20 mL via URETHRAL

## 2016-10-25 MED ORDER — PHENYLEPHRINE 40 MCG/ML (10ML) SYRINGE FOR IV PUSH (FOR BLOOD PRESSURE SUPPORT)
PREFILLED_SYRINGE | INTRAVENOUS | Status: AC
  Filled 2016-10-25: qty 10

## 2016-10-25 MED ORDER — FENTANYL CITRATE (PF) 100 MCG/2ML IJ SOLN
INTRAMUSCULAR | Status: DC | PRN
  Administered 2016-10-25: 100 ug via INTRAVENOUS
  Administered 2016-10-25: 25 ug via INTRAVENOUS

## 2016-10-25 MED ORDER — MITOMYCIN CHEMO FOR BLADDER INSTILLATION 40 MG
40.0000 mg | Freq: Once | INTRAVENOUS | Status: AC
  Administered 2016-10-25: 40 mg via INTRAVESICAL
  Filled 2016-10-25: qty 40

## 2016-10-25 MED ORDER — SUGAMMADEX SODIUM 200 MG/2ML IV SOLN
INTRAVENOUS | Status: DC | PRN
  Administered 2016-10-25: 200 mg via INTRAVENOUS

## 2016-10-25 MED ORDER — ROCURONIUM BROMIDE 50 MG/5ML IV SOSY
PREFILLED_SYRINGE | INTRAVENOUS | Status: AC
  Filled 2016-10-25: qty 5

## 2016-10-25 MED ORDER — MEPERIDINE HCL 50 MG/ML IJ SOLN: 6.2500 mg | INTRAMUSCULAR | Status: DC | PRN

## 2016-10-25 MED ORDER — SUCCINYLCHOLINE CHLORIDE 200 MG/10ML IV SOSY
PREFILLED_SYRINGE | INTRAVENOUS | Status: DC | PRN
  Administered 2016-10-25: 100 mg via INTRAVENOUS

## 2016-10-25 MED ORDER — CIPROFLOXACIN IN D5W 400 MG/200ML IV SOLN
INTRAVENOUS | Status: AC
  Filled 2016-10-25: qty 200

## 2016-10-25 MED ORDER — SUGAMMADEX SODIUM 200 MG/2ML IV SOLN
INTRAVENOUS | Status: AC
  Filled 2016-10-25: qty 2

## 2016-10-25 MED ORDER — LIDOCAINE 2% (20 MG/ML) 5 ML SYRINGE
INTRAMUSCULAR | Status: DC | PRN
  Administered 2016-10-25: 100 mg via INTRAVENOUS

## 2016-10-25 MED ORDER — HYDROMORPHONE HCL 1 MG/ML IJ SOLN
0.2500 mg | INTRAMUSCULAR | Status: DC | PRN
  Administered 2016-10-25 (×2): 0.5 mg via INTRAVENOUS

## 2016-10-25 MED ORDER — MIDAZOLAM HCL 2 MG/2ML IJ SOLN
INTRAMUSCULAR | Status: AC
  Filled 2016-10-25: qty 2

## 2016-10-25 MED ORDER — PHENYLEPHRINE HCL 10 MG/ML IJ SOLN
INTRAMUSCULAR | Status: AC
  Filled 2016-10-25: qty 3

## 2016-10-25 MED ORDER — LACTATED RINGERS IV SOLN: INTRAVENOUS | Status: DC

## 2016-10-25 MED ORDER — EPHEDRINE 5 MG/ML INJ
INTRAVENOUS | Status: AC
  Filled 2016-10-25: qty 10

## 2016-10-25 MED ORDER — HYDROMORPHONE HCL 1 MG/ML IJ SOLN
INTRAMUSCULAR | Status: AC
  Filled 2016-10-25: qty 0.5

## 2016-10-25 MED ORDER — PROMETHAZINE HCL 25 MG/ML IJ SOLN: 6.2500 mg | INTRAMUSCULAR | Status: DC | PRN

## 2016-10-25 MED ORDER — PROPOFOL 10 MG/ML IV BOLUS
INTRAVENOUS | Status: DC | PRN
  Administered 2016-10-25: 200 mg via INTRAVENOUS
  Administered 2016-10-25: 100 mg via INTRAVENOUS

## 2016-10-25 MED ORDER — EPHEDRINE SULFATE-NACL 50-0.9 MG/10ML-% IV SOSY
PREFILLED_SYRINGE | INTRAVENOUS | Status: DC | PRN
  Administered 2016-10-25: 10 mg via INTRAVENOUS
  Administered 2016-10-25 (×2): 5 mg via INTRAVENOUS

## 2016-10-25 MED ORDER — FENTANYL CITRATE (PF) 250 MCG/5ML IJ SOLN
INTRAMUSCULAR | Status: AC
Start: 2016-10-25 — End: 2016-10-25
  Filled 2016-10-25: qty 5

## 2016-10-25 MED ORDER — CIPROFLOXACIN IN D5W 400 MG/200ML IV SOLN
400.0000 mg | INTRAVENOUS | Status: AC
  Administered 2016-10-25: 400 mg via INTRAVENOUS

## 2016-10-25 MED ORDER — LIDOCAINE 2% (20 MG/ML) 5 ML SYRINGE
INTRAMUSCULAR | Status: AC
  Filled 2016-10-25: qty 5

## 2016-10-25 MED ORDER — DEXAMETHASONE SODIUM PHOSPHATE 10 MG/ML IJ SOLN
INTRAMUSCULAR | Status: AC
  Filled 2016-10-25: qty 1

## 2016-10-25 MED ORDER — PHENAZOPYRIDINE HCL 200 MG PO TABS
200.0000 mg | ORAL_TABLET | Freq: Once | ORAL | Status: AC | PRN
  Administered 2016-10-25: 200 mg via ORAL
  Filled 2016-10-25: qty 1

## 2016-10-25 MED ORDER — MIDAZOLAM HCL 5 MG/5ML IJ SOLN
INTRAMUSCULAR | Status: DC | PRN
  Administered 2016-10-25: 2 mg via INTRAVENOUS

## 2016-10-25 MED ORDER — HYDROMORPHONE HCL 1 MG/ML IJ SOLN
INTRAMUSCULAR | Status: AC
  Administered 2016-10-25: 0.5 mg via INTRAVENOUS
  Filled 2016-10-25: qty 0.5

## 2016-10-25 MED ORDER — LACTATED RINGERS IV SOLN
INTRAVENOUS | Status: DC | PRN
  Administered 2016-10-25: 07:00:00 via INTRAVENOUS

## 2016-10-25 MED ORDER — PHENAZOPYRIDINE HCL 200 MG PO TABS: 200.0000 mg | ORAL_TABLET | Freq: Three times a day (TID) | ORAL | 0 refills | Status: DC | PRN

## 2016-10-25 MED ORDER — SODIUM CHLORIDE 0.9 % IR SOLN
Status: DC | PRN
  Administered 2016-10-25: 6000 mL via INTRAVESICAL

## 2016-10-25 MED ORDER — ROCURONIUM BROMIDE 10 MG/ML (PF) SYRINGE
PREFILLED_SYRINGE | INTRAVENOUS | Status: DC | PRN
  Administered 2016-10-25: 25 mg via INTRAVENOUS

## 2016-10-25 MED ORDER — HYDROCODONE-ACETAMINOPHEN 5-325 MG PO TABS
1.0000 | ORAL_TABLET | Freq: Once | ORAL | Status: AC | PRN
  Administered 2016-10-25: 1 via ORAL
  Filled 2016-10-25: qty 1

## 2016-10-25 MED ORDER — ONDANSETRON HCL 4 MG/2ML IJ SOLN
INTRAMUSCULAR | Status: AC
  Filled 2016-10-25: qty 2

## 2016-10-25 MED ORDER — HYDROCODONE-ACETAMINOPHEN 5-325 MG PO TABS: 1.0000 | ORAL_TABLET | Freq: Three times a day (TID) | ORAL | 0 refills | Status: DC | PRN

## 2016-10-25 MED ORDER — PROPOFOL 10 MG/ML IV BOLUS
INTRAVENOUS | Status: AC
  Filled 2016-10-25: qty 40

## 2016-10-25 MED ORDER — MITOMYCIN CHEMO FOR BLADDER INSTILLATION 40 MG: 40.0000 mg | Freq: Once | INTRAVENOUS | Status: DC

## 2016-10-25 SURGICAL SUPPLY — 20 items
BAG URINE DRAINAGE (UROLOGICAL SUPPLIES) ×4 IMPLANT
BAG URO CATCHER STRL LF (MISCELLANEOUS) ×4 IMPLANT
BASKET DAKOTA 1.9FR 11X120 (BASKET) IMPLANT
BASKET ZERO TIP NITINOL 2.4FR (BASKET) IMPLANT
CATH FOLEY 2WAY SLVR  5CC 18FR (CATHETERS) ×2
CATH FOLEY 2WAY SLVR 5CC 18FR (CATHETERS) ×2 IMPLANT
CATH URET 5FR 28IN OPEN ENDED (CATHETERS) ×4 IMPLANT
CLOTH BEACON ORANGE TIMEOUT ST (SAFETY) ×4 IMPLANT
GLOVE BIOGEL M STRL SZ7.5 (GLOVE) ×4 IMPLANT
GOWN STRL REUS W/TWL XL LVL3 (GOWN DISPOSABLE) ×4 IMPLANT
GUIDEWIRE ANG ZIPWIRE 038X150 (WIRE) IMPLANT
GUIDEWIRE STR DUAL SENSOR (WIRE) ×4 IMPLANT
MANIFOLD NEPTUNE II (INSTRUMENTS) ×4 IMPLANT
PACK CYSTO (CUSTOM PROCEDURE TRAY) ×4 IMPLANT
SHEATH ACCESS URETERAL 24CM (SHEATH) IMPLANT
SHEATH ACCESS URETERAL 38CM (SHEATH) IMPLANT
SHEATH ACCESS URETERAL 54CM (SHEATH) IMPLANT
TUBING CONNECTING 10 (TUBING) ×3 IMPLANT
TUBING CONNECTING 10' (TUBING) ×1
WIRE COONS/BENSON .038X145CM (WIRE) IMPLANT

## 2016-10-25 NOTE — Anesthesia Postprocedure Evaluation (Signed)
Anesthesia Post Note  Patient: [name]  Procedure(s) Performed: Procedure(s) (LRB): TRANSURETHRAL RESECTION OF BLADDER TUMOR WITH MITOMYCIN-C (N/A) CYSTOSCOPY WITH RETROGRADE PYELOGRAM (Bilateral)  Patient location during evaluation: PACU Anesthesia Type: General Level of consciousness: awake and alert Pain management: pain level controlled Vital Signs Assessment: post-procedure vital signs reviewed and stable Respiratory status: spontaneous breathing, nonlabored ventilation, respiratory function stable and patient connected to nasal cannula oxygen Cardiovascular status: blood pressure returned to baseline and stable Postop Assessment: no signs of nausea or vomiting Anesthetic complications: no       Last Vitals:  Vitals:   10/25/16 1048 10/25/16 1230  BP: 121/87 122/84  Pulse: 70 72  Resp: 16 18  Temp: 36.4 C     Last Pain:  Vitals:   10/25/16 1230  TempSrc:   PainSc: [place]

## 2016-10-25 NOTE — Discharge Instructions (Signed)

## 2016-10-25 NOTE — Op Note (Signed)
Preoperative diagnosis:  1. Recurrence of his bladder cancer   Postoperative diagnosis:  1. Same   Procedure: 1. Retrograde pyelograms bilaterally with interpretation 2. TURBT less than 2 cm in size 3. Postoperative instillation of mitomycin-C  Surgeon: Ardis Hughs, MD  Anesthesia: General  Complications: None  Intraoperative findings:  #1:10 mL of Omnipaque contrast was instilled into the patient's right ureteral orifice using a 5 [place] open-ended catheter demonstrating a normal caliber ureter all the way up to the right renal pelvis. The patient had sharp calyces with no filling defects or hydronephrosis.  #2:10 mL of Omnipaque contrast was instilled into the patient's left ureteral orifice using a 5 French ureteral catheter demonstrating a normal caliber ureter all the way up to the left renal pelvis. The patient had sharp calyces with no filling defects or hydronephrosis.  #3: The patient had a 5-7 mm lesion on the left lateral bladder wall as well as a abnormal appearing area just proximal to this as it relates to the bladder neck. Both of these areas were resected and sent separately.  EBL: Minimal  Specimens:  #1: Left lateral wall tumor #2: resection of scar tissue left lateral wall   Indication: [name] is a 61 y.o. patient with history of high-grade bladder cancer with several recurrences. His last recurrence was in March 2017, this was low-grade. On cystoscopy in the clinic for routine surveillance of his bladder cancer he was noted to have a small recurrence..  After reviewing the management options for treatment, he elected to proceed with the above surgical procedure(s). We have discussed the potential benefits and risks of the procedure, side effects of the proposed treatment, the likelihood of the patient achieving the goals of the procedure, and any potential problems that might occur during the procedure or recuperation. Informed consent has been  obtained.  Description of procedure:  The patient was taken to the operating room and general anesthesia was induced.  The patient was placed in the dorsal lithotomy position, prepped and draped in the usual sterile fashion, and preoperative antibiotics were administered. A preoperative time-out was performed.   52 French 30 cystoscope was gently passed to the patient's urethra into the bladder under visual guidance. Then 60 cystoscopic evaluation was performed demonstrating the above findings. There were no other unusual or abnormal findings. The ureteral orifices were orthotopic.   Using a 5 French opening ureteral catheter I performed retrograde pyelograms bilaterally as described above. These were normal.  I then dilated the patient's urethral meatus to 30 [place] and then inserted a 26 French resectoscope sheath using the visual obturator. I then exchanged this for the loop and proceeded to resect the patient's recurrent bladder tumor in the left lateral wall. I also resected the scar tissue just proximal to this as it appeared abnormal. Hemostasis was noted to be excellent.  I then inserted an 21 French Foley catheter. The patient was subsequently extubated and returned to the PACU in stable condition.  In the PACU, 40 mL's with 40 mg of mitomycin-C was instilled into the patient's bladder through an 63 French Foley catheter.  This was allowed to dwell for 60-90 minutes prior to emptying the Foley catheter and removing it. Ardis Hughs, M.D.

## 2016-10-25 NOTE — Transfer of Care (Signed)
Immediate Anesthesia Transfer of Care Note  Patient: [name]  Procedure(s) Performed: Procedure(s): TRANSURETHRAL RESECTION OF BLADDER TUMOR WITH MITOMYCIN-C (N/A) CYSTOSCOPY WITH RETROGRADE PYELOGRAM (Bilateral)  Patient Location: PACU  Anesthesia Type:General  Level of Consciousness:  sedated, patient cooperative and responds to stimulation  Airway & Oxygen Therapy:Patient Spontanous Breathing and Patient connected to face mask oxgen  Post-op Assessment:  Report given to PACU RN and Post -op Vital signs reviewed and stable  Post vital signs:  Reviewed and stable  Last Vitals:  Vitals:   10/25/16 0541  BP: (!) 131/94  Pulse: 77  Resp: 18  Temp: 34.1 C    Complications: No apparent anesthesia complications

## 2016-10-25 NOTE — Anesthesia Procedure Notes (Signed)
Procedure Name: Intubation Date/Time: 10/25/2016 7:55 AM Performed by: Markeia Harkless, Virgel Gess Pre-anesthesia Checklist: Patient identified, Emergency Drugs available, Suction available, Patient being monitored and Timeout performed Patient Re-evaluated:Patient Re-evaluated prior to inductionOxygen Delivery Method: Circle system utilized Preoxygenation: Pre-oxygenation with 100% oxygen Intubation Type: IV induction Ventilation: Mask ventilation without difficulty Laryngoscope Size: Mac and 4 Grade View: Grade I Tube type: Oral Tube size: 7.5 mm Number of attempts: 1 Airway Equipment and Method: Stylet Placement Confirmation: ETT inserted through vocal cords under direct vision,  positive ETCO2,  CO2 detector and breath sounds checked- equal and bilateral Secured at: 22 cm Tube secured with: Tape Dental Injury: Teeth and Oropharynx as per pre-operative assessment  Comments: LMA 5 proseal would not seat properly.  Ett used, easy mask.
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
## 2016-11-06 ENCOUNTER — Ambulatory Visit (INDEPENDENT_AMBULATORY_CARE_PROVIDER_SITE_OTHER): Payer: Managed Care, Other (non HMO)

## 2016-11-06 ENCOUNTER — Ambulatory Visit (INDEPENDENT_AMBULATORY_CARE_PROVIDER_SITE_OTHER): Payer: Managed Care, Other (non HMO) | Admitting: Orthopedic Surgery

## 2016-11-06 ENCOUNTER — Encounter (INDEPENDENT_AMBULATORY_CARE_PROVIDER_SITE_OTHER): Payer: Self-pay | Admitting: Orthopedic Surgery

## 2016-11-06 DIAGNOSIS — S32591D Other specified fracture of right pubis, subsequent encounter for fracture with routine healing: Secondary | ICD-10-CM

## 2016-11-06 DIAGNOSIS — S32401D Unspecified fracture of right acetabulum, subsequent encounter for fracture with routine healing: Secondary | ICD-10-CM | POA: Diagnosis not present

## 2016-11-06 DIAGNOSIS — S32501A Unspecified fracture of right pubis, initial encounter for closed fracture: Secondary | ICD-10-CM

## 2016-11-06 MED ORDER — HYDROCODONE-ACETAMINOPHEN 5-325 MG PO TABS: 1.0000 | ORAL_TABLET | Freq: Three times a day (TID) | ORAL | 0 refills | Status: DC | PRN

## 2016-11-06 MED ORDER — ONDANSETRON HCL 4 MG PO TABS: 4.0000 mg | ORAL_TABLET | Freq: Two times a day (BID) | ORAL | 0 refills | Status: DC | PRN

## 2016-11-08 NOTE — Progress Notes (Signed)
Post-Op Visit Note   Patient: [name]           Date of Birth: [birth year]           MRN: [id number] Visit Date: 11/06/2016 PCP: Nilda Simmer, MD   Assessment & Plan:  Chief Complaint:  Chief Complaint  Patient presents with  . Pelvis - Follow-up  . Hip Injury    follow up   Visit Diagnoses:  1. Closed nondisplaced fracture of right acetabulum with routine healing, unspecified portion of acetabulum, subsequent encounter   2. Closed displaced fracture of right pubis, initial encounter ([place])   3. Closed fracture of ramus of right pubis with routine healing, subsequent encounter     Plan: [name] is a 61 year old patient with pubic and acetabular rami fractures.  Patient taking hydrocodone for pain.  One half to one before bedtime.  That's refill.  He wants to return to work.  He 6 weeks out from his fractures.  On exam he has pretty good range of motion of the hip and no groin pain with internal/external rotation of the leg.  Radiographs are normal.  The lateral return to work on Monday.  Radiographs show good healing of the fracture.  I think is okay to get on the trainer as well in regards to his biking.  I'll see him back as needed  Follow-Up Instructions: No Follow-up on file.   Orders:  Orders Placed This Encounter  Procedures  . XR Pelvis 1-2 Views   Meds ordered this encounter  Medications  . HYDROcodone-acetaminophen (NORCO/VICODIN) 5-325 MG tablet    Sig: Take 1 tablet by mouth every 8 (eight) hours as needed for severe pain.    Dispense:  30 tablet    Refill:  0  . ondansetron (ZOFRAN) 4 MG tablet    Sig: Take 1 tablet (4 mg total) by mouth every 12 (twelve) hours as needed for nausea or vomiting.    Dispense:  30 tablet    Refill:  0    Imaging: No results found.  PMFS History: Patient Active Problem List   Diagnosis Date Noted  . Pubic bone fracture ([place]) 09/25/2016  . Pubic ramus fracture ([place]) 09/25/2016  . Acetabular fracture ([place])  09/25/2016  . Chronic systolic CHF (congestive heart failure) ([place]) 09/25/2016  . Heart block AV second degree 08/09/2016  . Mobitz type 2 second degree atrioventricular block 08/09/2016  . Abnormal stress test   . Bicuspid aortic valve   . Aortic insufficiency   . S/P aortic valve replacement with bioprosthetic valve   . NICM (nonischemic cardiomyopathy) ([place])   . RBBB   . Essential hypertension   . AORTIC VALVE DISORDERS 01/30/2009   Past Medical History:  Diagnosis Date  . Abnormal LFTs    a. eval in 2017 for this - abd [place] with diffuse hepatocellular disease.  . Aortic insufficiency    a. bicuspid AV/AI s/p bioprosthetic AVR and aortic root replacement 05/2009.  [name] Bicuspid aortic valve   . Bladder cancer ([place])    recurrent bladder tumor   . Depression   . Dilated aortic root ([place])   . Essential hypertension   . Former consumption of alcohol   . Frequency of urination   . Headache    hx of cluster headaches  . Heart murmur   . Hepatocellular dysfunction   . History of aortic insufficiency   . NICM (nonischemic cardiomyopathy) ([place])    a. Previous EF 45-50% in 2009 - echo in 2014  reported EF 50-55% but upon review felt to be lower - f/u echo 07/2016 with EF 40-45%. (no CAD by cath in 2010).  . Nocturia   . Presence of permanent cardiac pacemaker    Left chest  . RBBB    hx of  . S/P aortic valve replacement with bioprosthetic valve 05-10-2009   BY DR Ricard Dillon   CARDIOLOGIST-  DR Angelena Form    Family History  Problem Relation Age of Onset  . Mitral valve prolapse Father     Past Surgical History:  Procedure Laterality Date  . APPENDECTOMY  AGE 17  . BIOLOGICAL BENTALL AORTIC ROOT REPLACEMENT W/ PERICARDIAL TISSUE VALVE AND SYNTHETIC AORTIC GRAFT  05-10-2009  DR OWENS   BICUSPID AV W/ SEVERE AI &  ANEURYSM OF AORTIC ROOT AND PROXIMAL ASCENDING THORACIC AORTA  . CARDIAC CATHETERIZATION  08-10-2008  DR MCALHANY   NO EVIDENCE CAD/ MILD GLOBAL LVSF/ MODERATE AORTIC  INSUFFICIENCY/ MILD DILATION OF THE AORTIC ROOT/ EF 45-50%  . CARDIAC CATHETERIZATION N/A 08/09/2016   Procedure: Left Heart Cath and Coronary Angiography;  Surgeon: Jettie Booze, MD;  Location: [place] [facility];  Service: Cardiovascular;  Laterality: N/A;  . CATARACT EXTRACTION W/ INTRAOCULAR LENS IMPLANT Right 2015  . CYSTOSCOPY W/ RETROGRADES Bilateral 12/31/2012   Procedure: CYSTOSCOPY WITH RETROGRADE PYELOGRAM With CYSTOGRAM AND DEEP BLADDER BIOPSY ;  Surgeon: Molli Hazard, MD;  Location: [facility];  Service: Urology;  Laterality: Bilateral;  . CYSTOSCOPY W/ RETROGRADES Bilateral 10/12/2015   Procedure: BILATERAL RETROGRADE PYELOGRAM AND POST OP MITOMYCIN C;  Surgeon: Ardis Hughs, MD;  Location: [facility];  Service: Urology;  Laterality: Bilateral;  . CYSTOSCOPY W/ RETROGRADES Bilateral 10/25/2016   Procedure: CYSTOSCOPY WITH RETROGRADE PYELOGRAM;  Surgeon: Ardis Hughs, MD;  [facility];  Service: Urology;  Laterality: Bilateral;  . CYSTOSCOPY WITH BIOPSY N/A 02/22/2013   Procedure: CYSTOSCOPY WITH BIOPSY;  Surgeon: Molli Hazard, MD;  Location: [facility];  Service: Urology;  Laterality: N/A;  . CYSTOSCOPY WITH BIOPSY N/A 08/02/2013   Procedure: CYSTOSCOPY WITH BLADDER BIOPSY AND  BILATERAL RETROGRADES;  Surgeon: Molli Hazard, MD;  Location: [facility];  Service: Urology;  Laterality: N/A;  . EP IMPLANTABLE DEVICE N/A 08/09/2016   Procedure: BiV Pacemaker Insertion CRT-P;  Surgeon: Evans Lance, MD;  Location: [place] [facility];  Service: Cardiovascular;  Laterality: N/A;  . HYDROCELE EXCISION / REPAIR  2009  . TRANSTHORACIC ECHOCARDIOGRAM  07-23-2013  DR MCALHANY   mild LVH,  ef 50-55%/   NORMAL FUNCTIONING BIOPROSTHETIC AORTIC VALVE (mean granient 43mmHg, peak gradient 50mmHg)/ 54mm prosthetic aortic root/  trivial PR/  mild RAE/  mild dilated RV  . TRANSURETHRAL RESECTION OF  BLADDER TUMOR N/A 12/31/2012   Procedure: TRANSURETHRAL RESECTION OF BLADDER TUMOR (TURBT);  Surgeon: Molli Hazard, MD;  Location[facility];  Service: Urology;  Laterality: N/A;  . TRANSURETHRAL RESECTION OF BLADDER TUMOR N/A 10/12/2015   Procedure: TRANSURETHRAL RESECTION OF BLADDER TUMOR (TURBT);  Surgeon: Ardis Hughs, MD;  Location: [facility];  Service: Urology;  Laterality: N/A;  . TRANSURETHRAL RESECTION OF BLADDER TUMOR WITH MITOMYCIN-C N/A 10/25/2016   Procedure: TRANSURETHRAL RESECTION OF BLADDER TUMOR WITH MITOMYCIN-C;  Surgeon: Ardis Hughs, MD;  [facility];  Service: Urology;  Laterality: N/A;   Social History   Occupational History  . Not on file.   Social History Main Topics  . Smoking status:  Former Smoker    Packs/day: 2.00    Years: 25.00    Types: Cigarettes    Quit date: 12/30/2007  . Smokeless tobacco: Never Used  . Alcohol use No  . Drug use: No  . Sexual activity: Yes

## 2016-11-19 ENCOUNTER — Ambulatory Visit (INDEPENDENT_AMBULATORY_CARE_PROVIDER_SITE_OTHER): Payer: Managed Care, Other (non HMO) | Admitting: Internal Medicine

## 2016-11-19 ENCOUNTER — Encounter (INDEPENDENT_AMBULATORY_CARE_PROVIDER_SITE_OTHER): Payer: Self-pay

## 2016-11-19 ENCOUNTER — Encounter: Payer: Self-pay | Admitting: Internal Medicine

## 2016-11-19 DIAGNOSIS — I5022 Chronic systolic (congestive) heart failure: Secondary | ICD-10-CM | POA: Diagnosis not present

## 2016-11-19 DIAGNOSIS — I441 Atrioventricular block, second degree: Secondary | ICD-10-CM

## 2016-11-19 DIAGNOSIS — I428 Other cardiomyopathies: Secondary | ICD-10-CM

## 2016-11-19 LAB — CUP PACEART INCLINIC DEVICE CHECK
Battery Voltage: 3.15 V
Brady Statistic AS VP Percent: 98.13 %
Brady Statistic RA Percent Paced: 0.2 %
Brady Statistic RV Percent Paced: 39.09 %
Implantable Lead Implant Date: 20180105
Implantable Lead Implant Date: 20180105
Implantable Lead Location: 753859
Implantable Lead Location: 753860
Implantable Lead Model: 4398
Implantable Lead Model: 5076
Lead Channel Impedance Value: 456 Ohm
Lead Channel Impedance Value: 456 Ohm
Lead Channel Impedance Value: 532 Ohm
Lead Channel Impedance Value: 608 Ohm
Lead Channel Impedance Value: 703 Ohm
Lead Channel Impedance Value: 741 Ohm
Lead Channel Pacing Threshold Pulse Width: 0.4 ms
Lead Channel Pacing Threshold Pulse Width: 0.8 ms
Lead Channel Sensing Intrinsic Amplitude: 2.5 mV
Lead Channel Sensing Intrinsic Amplitude: 2.625 mV
Lead Channel Sensing Intrinsic Amplitude: 9.5 mV
Lead Channel Sensing Intrinsic Amplitude: 9.625 mV
Lead Channel Setting Pacing Amplitude: 2.5 V
Lead Channel Setting Pacing Pulse Width: 0.4 ms
Lead Channel Setting Sensing Sensitivity: 2 mV
MDC IDC LEAD IMPLANT DT: 20180105
MDC IDC LEAD LOCATION: 753858
MDC IDC MSMT BATTERY REMAINING LONGEVITY: 133 mo
MDC IDC MSMT LEADCHNL LV IMPEDANCE VALUE: 627 Ohm
MDC IDC MSMT LEADCHNL LV IMPEDANCE VALUE: 684 Ohm
MDC IDC MSMT LEADCHNL LV PACING THRESHOLD AMPLITUDE: 0.875 V
MDC IDC MSMT LEADCHNL RA IMPEDANCE VALUE: 304 Ohm
MDC IDC MSMT LEADCHNL RA IMPEDANCE VALUE: 399 Ohm
MDC IDC MSMT LEADCHNL RA PACING THRESHOLD AMPLITUDE: 0.625 V
MDC IDC MSMT LEADCHNL RV PACING THRESHOLD AMPLITUDE: 0.5 V
MDC IDC MSMT LEADCHNL RV PACING THRESHOLD PULSEWIDTH: 0.4 ms
MDC IDC PG IMPLANT DT: 20180105
MDC IDC SESS DTM: 20180417105034
MDC IDC SET LEADCHNL LV PACING AMPLITUDE: 1.5 V
MDC IDC SET LEADCHNL LV PACING PULSEWIDTH: 0.8 ms
MDC IDC SET LEADCHNL RA PACING AMPLITUDE: 2 V
MDC IDC STAT BRADY AP VP PERCENT: 0.19 %
MDC IDC STAT BRADY AP VS PERCENT: 0.01 %
MDC IDC STAT BRADY AS VS PERCENT: 1.67 %
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
## 2016-11-19 NOTE — Patient Instructions (Addendum)
Your physician recommends that you continue on your current medications as directed. Please refer to the Current Medication list given to you today.  Remote monitoring is used to monitor your Pacemaker of ICD from home. This monitoring reduces the number of office visits required to check your device to one time per year. It allows [place] to keep an eye on the functioning of your device to ensure it is working properly. You are scheduled for a device check from home on 02/19/16. You may send your transmission at any time that day. If you have a wireless device, the transmission will be sent automatically. After your physician reviews your transmission, you will receive a postcard with your next transmission date.  Your physician wants you to follow-up in: 9 months with Dr. Lovena Le.  You will receive a reminder letter in the mail two months in advance. If you don't receive a letter, please call our office to schedule the follow-up appointment.

## 2017-01-03 NOTE — Anesthesia Postprocedure Evaluation (Signed)
Anesthesia Post Note  Patient: [name]  Procedure(s) Performed: Procedure(s) (LRB): TRANSURETHRAL RESECTION OF BLADDER TUMOR WITH MITOMYCIN-C (N/A) CYSTOSCOPY WITH RETROGRADE PYELOGRAM (Bilateral)     Anesthesia Post Evaluation  Last Vitals:  Vitals:   10/25/16 1048 10/25/16 1230  BP: 121/87 122/84  Pulse: 70 72  Resp: 16 18  Temp: 36.4 C     Last Pain:  Vitals:   10/25/16 1230  TempSrc:   PainSc: 3                  Effie Berkshire

## 2017-01-03 NOTE — Addendum Note (Signed)
Addendum  created 01/03/17 1123 by Effie Berkshire, MD   Sign clinical note

## 2017-02-18 ENCOUNTER — Ambulatory Visit (INDEPENDENT_AMBULATORY_CARE_PROVIDER_SITE_OTHER): Payer: Self-pay | Admitting: *Deleted

## 2017-02-18 DIAGNOSIS — I441 Atrioventricular block, second degree: Secondary | ICD-10-CM

## 2017-02-18 DIAGNOSIS — I5022 Chronic systolic (congestive) heart failure: Secondary | ICD-10-CM

## 2017-02-18 NOTE — Progress Notes (Signed)
Remote pacemaker transmission.   

## 2017-02-19 ENCOUNTER — Encounter: Payer: Self-pay | Admitting: Cardiology

## 2017-02-20 LAB — CUP PACEART REMOTE DEVICE CHECK
Battery Remaining Longevity: 143 mo
Battery Voltage: 3.09 V
Brady Statistic AP VP Percent: 0.29 %
Brady Statistic AS VS Percent: 1.96 %
Brady Statistic RV Percent Paced: 13.14 %
Implantable Lead Implant Date: 20180105
Implantable Lead Location: 753859
Implantable Lead Location: 753860
Implantable Lead Model: 4398
Implantable Lead Model: 5076
Implantable Lead Model: 5076
Implantable Pulse Generator Implant Date: 20180105
Lead Channel Impedance Value: 304 Ohm
Lead Channel Impedance Value: 342 Ohm
Lead Channel Impedance Value: 380 Ohm
Lead Channel Impedance Value: 437 Ohm
Lead Channel Impedance Value: 456 Ohm
Lead Channel Impedance Value: 475 Ohm
Lead Channel Impedance Value: 532 Ohm
Lead Channel Impedance Value: 684 Ohm
Lead Channel Impedance Value: 722 Ohm
Lead Channel Impedance Value: 760 Ohm
Lead Channel Pacing Threshold Amplitude: 0.75 V
Lead Channel Pacing Threshold Pulse Width: 0.4 ms
Lead Channel Sensing Intrinsic Amplitude: 2.75 mV
Lead Channel Sensing Intrinsic Amplitude: 9.125 mV
Lead Channel Sensing Intrinsic Amplitude: 9.125 mV
Lead Channel Setting Pacing Amplitude: 1.25 V
Lead Channel Setting Pacing Amplitude: 2.5 V
Lead Channel Setting Pacing Pulse Width: 0.4 ms
Lead Channel Setting Pacing Pulse Width: 0.8 ms
MDC IDC LEAD IMPLANT DT: 20180105
MDC IDC LEAD IMPLANT DT: 20180105
MDC IDC LEAD LOCATION: 753858
MDC IDC MSMT LEADCHNL LV IMPEDANCE VALUE: 418 Ohm
MDC IDC MSMT LEADCHNL LV IMPEDANCE VALUE: 608 Ohm
MDC IDC MSMT LEADCHNL LV IMPEDANCE VALUE: 646 Ohm
MDC IDC MSMT LEADCHNL LV PACING THRESHOLD PULSEWIDTH: 0.8 ms
MDC IDC MSMT LEADCHNL RA IMPEDANCE VALUE: 380 Ohm
MDC IDC MSMT LEADCHNL RA PACING THRESHOLD AMPLITUDE: 0.5 V
MDC IDC MSMT LEADCHNL RA SENSING INTR AMPL: 2.75 mV
MDC IDC MSMT LEADCHNL RV PACING THRESHOLD AMPLITUDE: 0.5 V
MDC IDC MSMT LEADCHNL RV PACING THRESHOLD PULSEWIDTH: 0.4 ms
MDC IDC SESS DTM: 20180717092628
MDC IDC SET LEADCHNL RA PACING AMPLITUDE: 2 V
MDC IDC SET LEADCHNL RV SENSING SENSITIVITY: 2 mV
MDC IDC STAT BRADY AP VS PERCENT: 0.02 %
MDC IDC STAT BRADY AS VP PERCENT: 97.74 %
MDC IDC STAT BRADY RA PERCENT PACED: 0.3 %

## 2017-05-20 ENCOUNTER — Ambulatory Visit (INDEPENDENT_AMBULATORY_CARE_PROVIDER_SITE_OTHER): Payer: Self-pay | Admitting: *Deleted

## 2017-05-20 DIAGNOSIS — I5022 Chronic systolic (congestive) heart failure: Secondary | ICD-10-CM

## 2017-05-20 DIAGNOSIS — I441 Atrioventricular block, second degree: Secondary | ICD-10-CM

## 2017-05-20 NOTE — Progress Notes (Signed)
Remote pacemaker transmission.   

## 2017-05-22 LAB — CUP PACEART REMOTE DEVICE CHECK
Battery Voltage: 3.05 V
Brady Statistic AP VP Percent: 0.9 %
Brady Statistic RA Percent Paced: 0.91 %
Brady Statistic RV Percent Paced: 32.61 %
Date Time Interrogation Session: 20181016124527
Implantable Lead Implant Date: 20180105
Implantable Lead Implant Date: 20180105
Implantable Lead Location: 753858
Implantable Lead Location: 753859
Implantable Lead Model: 4398
Implantable Lead Model: 5076
Lead Channel Impedance Value: 437 Ohm
Lead Channel Impedance Value: 456 Ohm
Lead Channel Impedance Value: 646 Ohm
Lead Channel Impedance Value: 684 Ohm
Lead Channel Sensing Intrinsic Amplitude: 3.375 mV
Lead Channel Sensing Intrinsic Amplitude: 8.625 mV
Lead Channel Sensing Intrinsic Amplitude: 8.625 mV
Lead Channel Setting Pacing Amplitude: 1.25 V
Lead Channel Setting Pacing Amplitude: 2.5 V
Lead Channel Setting Pacing Pulse Width: 0.8 ms
Lead Channel Setting Sensing Sensitivity: 2 mV
MDC IDC LEAD IMPLANT DT: 20180105
MDC IDC LEAD LOCATION: 753860
MDC IDC MSMT BATTERY REMAINING LONGEVITY: 138 mo
MDC IDC MSMT LEADCHNL LV IMPEDANCE VALUE: 323 Ohm
MDC IDC MSMT LEADCHNL LV IMPEDANCE VALUE: 361 Ohm
MDC IDC MSMT LEADCHNL LV IMPEDANCE VALUE: 399 Ohm
MDC IDC MSMT LEADCHNL LV IMPEDANCE VALUE: 418 Ohm
MDC IDC MSMT LEADCHNL LV IMPEDANCE VALUE: 589 Ohm
MDC IDC MSMT LEADCHNL LV IMPEDANCE VALUE: 608 Ohm
MDC IDC MSMT LEADCHNL LV IMPEDANCE VALUE: 722 Ohm
MDC IDC MSMT LEADCHNL LV PACING THRESHOLD AMPLITUDE: 0.75 V
MDC IDC MSMT LEADCHNL LV PACING THRESHOLD PULSEWIDTH: 0.8 ms
MDC IDC MSMT LEADCHNL RA IMPEDANCE VALUE: 304 Ohm
MDC IDC MSMT LEADCHNL RA IMPEDANCE VALUE: 380 Ohm
MDC IDC MSMT LEADCHNL RA PACING THRESHOLD AMPLITUDE: 0.625 V
MDC IDC MSMT LEADCHNL RA PACING THRESHOLD PULSEWIDTH: 0.4 ms
MDC IDC MSMT LEADCHNL RA SENSING INTR AMPL: 3.375 mV
MDC IDC MSMT LEADCHNL RV IMPEDANCE VALUE: 380 Ohm
MDC IDC MSMT LEADCHNL RV PACING THRESHOLD AMPLITUDE: 0.625 V
MDC IDC MSMT LEADCHNL RV PACING THRESHOLD PULSEWIDTH: 0.4 ms
MDC IDC PG IMPLANT DT: 20180105
MDC IDC SET LEADCHNL RA PACING AMPLITUDE: 2 V
MDC IDC SET LEADCHNL RV PACING PULSEWIDTH: 0.4 ms
MDC IDC STAT BRADY AP VS PERCENT: 0.03 %
MDC IDC STAT BRADY AS VP PERCENT: 96.88 %
MDC IDC STAT BRADY AS VS PERCENT: 2.2 %

## 2017-05-23 ENCOUNTER — Encounter: Payer: Self-pay | Admitting: Cardiology

## 2017-08-19 ENCOUNTER — Ambulatory Visit (INDEPENDENT_AMBULATORY_CARE_PROVIDER_SITE_OTHER): Payer: Self-pay | Admitting: *Deleted

## 2017-08-19 DIAGNOSIS — I441 Atrioventricular block, second degree: Secondary | ICD-10-CM

## 2017-08-19 DIAGNOSIS — I5022 Chronic systolic (congestive) heart failure: Secondary | ICD-10-CM

## 2017-08-19 DIAGNOSIS — I428 Other cardiomyopathies: Secondary | ICD-10-CM

## 2017-08-20 NOTE — Progress Notes (Signed)
Remote pacemaker transmission.   

## 2017-08-22 ENCOUNTER — Encounter: Payer: Self-pay | Admitting: Cardiology

## 2017-08-24 LAB — CUP PACEART REMOTE DEVICE CHECK
Battery Remaining Longevity: 137 mo
Battery Voltage: 3.03 V
Brady Statistic AP VS Percent: 0.01 %
Brady Statistic AS VS Percent: 1.34 %
Brady Statistic RV Percent Paced: 46.67 %
Implantable Lead Implant Date: 20180105
Implantable Lead Location: 753859
Implantable Lead Location: 753860
Implantable Lead Model: 4398
Implantable Lead Model: 5076
Implantable Lead Model: 5076
Lead Channel Impedance Value: 323 Ohm
Lead Channel Impedance Value: 456 Ohm
Lead Channel Impedance Value: 494 Ohm
Lead Channel Impedance Value: 570 Ohm
Lead Channel Impedance Value: 684 Ohm
Lead Channel Impedance Value: 741 Ohm
Lead Channel Pacing Threshold Amplitude: 0.75 V
Lead Channel Pacing Threshold Pulse Width: 0.4 ms
Lead Channel Pacing Threshold Pulse Width: 0.8 ms
Lead Channel Sensing Intrinsic Amplitude: 3.5 mV
Lead Channel Sensing Intrinsic Amplitude: 7.5 mV
Lead Channel Sensing Intrinsic Amplitude: 7.5 mV
Lead Channel Setting Pacing Amplitude: 1.25 V
Lead Channel Setting Pacing Amplitude: 2.5 V
Lead Channel Setting Pacing Pulse Width: 0.4 ms
Lead Channel Setting Pacing Pulse Width: 0.8 ms
MDC IDC LEAD IMPLANT DT: 20180105
MDC IDC LEAD IMPLANT DT: 20180105
MDC IDC LEAD LOCATION: 753858
MDC IDC MSMT LEADCHNL LV IMPEDANCE VALUE: 399 Ohm
MDC IDC MSMT LEADCHNL LV IMPEDANCE VALUE: 437 Ohm
MDC IDC MSMT LEADCHNL LV IMPEDANCE VALUE: 627 Ohm
MDC IDC MSMT LEADCHNL LV IMPEDANCE VALUE: 665 Ohm
MDC IDC MSMT LEADCHNL LV IMPEDANCE VALUE: 779 Ohm
MDC IDC MSMT LEADCHNL RA IMPEDANCE VALUE: 304 Ohm
MDC IDC MSMT LEADCHNL RA IMPEDANCE VALUE: 380 Ohm
MDC IDC MSMT LEADCHNL RA PACING THRESHOLD AMPLITUDE: 0.5 V
MDC IDC MSMT LEADCHNL RA SENSING INTR AMPL: 3.5 mV
MDC IDC MSMT LEADCHNL RV IMPEDANCE VALUE: 456 Ohm
MDC IDC MSMT LEADCHNL RV PACING THRESHOLD AMPLITUDE: 0.5 V
MDC IDC MSMT LEADCHNL RV PACING THRESHOLD PULSEWIDTH: 0.4 ms
MDC IDC PG IMPLANT DT: 20180105
MDC IDC SESS DTM: 20190115103157
MDC IDC SET LEADCHNL RA PACING AMPLITUDE: 2 V
MDC IDC SET LEADCHNL RV SENSING SENSITIVITY: 2 mV
MDC IDC STAT BRADY AP VP PERCENT: 0.27 %
MDC IDC STAT BRADY AS VP PERCENT: 98.38 %
MDC IDC STAT BRADY RA PERCENT PACED: 0.29 %

## 2017-10-01 ENCOUNTER — Other Ambulatory Visit: Payer: Self-pay | Admitting: Cardiovascular Disease

## 2017-10-07 ENCOUNTER — Ambulatory Visit (INDEPENDENT_AMBULATORY_CARE_PROVIDER_SITE_OTHER): Payer: BLUE CROSS/BLUE SHIELD | Admitting: Internal Medicine

## 2017-10-07 ENCOUNTER — Encounter: Payer: Self-pay | Admitting: Internal Medicine

## 2017-10-07 VITALS — BP 136/88 | HR 82 | Ht 72.0 in | Wt 195.6 lb

## 2017-10-07 DIAGNOSIS — Z950 Presence of cardiac pacemaker: Secondary | ICD-10-CM

## 2017-10-07 DIAGNOSIS — I5022 Chronic systolic (congestive) heart failure: Secondary | ICD-10-CM | POA: Diagnosis not present

## 2017-10-07 DIAGNOSIS — I441 Atrioventricular block, second degree: Secondary | ICD-10-CM | POA: Diagnosis not present

## 2017-10-07 LAB — CUP PACEART INCLINIC DEVICE CHECK
Battery Remaining Longevity: 133 mo
Battery Voltage: 3.02 V
Brady Statistic AP VP Percent: 0.54 %
Brady Statistic AS VS Percent: 1.61 %
Brady Statistic RA Percent Paced: 0.56 %
Implantable Lead Location: 753859
Implantable Lead Location: 753860
Implantable Lead Model: 4398
Implantable Lead Model: 5076
Lead Channel Impedance Value: 418 Ohm
Lead Channel Impedance Value: 456 Ohm
Lead Channel Impedance Value: 589 Ohm
Lead Channel Impedance Value: 646 Ohm
Lead Channel Impedance Value: 665 Ohm
Lead Channel Impedance Value: 722 Ohm
Lead Channel Pacing Threshold Amplitude: 0.625 V
Lead Channel Pacing Threshold Amplitude: 0.75 V
Lead Channel Pacing Threshold Pulse Width: 0.4 ms
Lead Channel Pacing Threshold Pulse Width: 0.8 ms
Lead Channel Sensing Intrinsic Amplitude: 7.875 mV
Lead Channel Sensing Intrinsic Amplitude: 7.875 mV
Lead Channel Setting Pacing Amplitude: 1.25 V
Lead Channel Setting Pacing Amplitude: 2 V
Lead Channel Setting Pacing Amplitude: 2.5 V
Lead Channel Setting Pacing Pulse Width: 0.4 ms
Lead Channel Setting Pacing Pulse Width: 0.8 ms
Lead Channel Setting Sensing Sensitivity: 2 mV
MDC IDC LEAD IMPLANT DT: 20180105
MDC IDC LEAD IMPLANT DT: 20180105
MDC IDC LEAD IMPLANT DT: 20180105
MDC IDC LEAD LOCATION: 753858
MDC IDC MSMT LEADCHNL LV IMPEDANCE VALUE: 475 Ohm
MDC IDC MSMT LEADCHNL LV IMPEDANCE VALUE: 608 Ohm
MDC IDC MSMT LEADCHNL LV IMPEDANCE VALUE: 741 Ohm
MDC IDC MSMT LEADCHNL RA IMPEDANCE VALUE: 304 Ohm
MDC IDC MSMT LEADCHNL RA PACING THRESHOLD AMPLITUDE: 0.5 V
MDC IDC MSMT LEADCHNL RA PACING THRESHOLD PULSEWIDTH: 0.4 ms
MDC IDC MSMT LEADCHNL RA SENSING INTR AMPL: 3.375 mV
MDC IDC MSMT LEADCHNL RA SENSING INTR AMPL: 3.375 mV
MDC IDC PG IMPLANT DT: 20180105
MDC IDC SESS DTM: 20190305093456
MDC IDC STAT BRADY AP VS PERCENT: 0.02 %
MDC IDC STAT BRADY AS VP PERCENT: 97.83 %
MDC IDC STAT BRADY RV PERCENT PACED: 40.63 %

## 2017-10-07 NOTE — Progress Notes (Signed)
HPI [name] presents today for followup. He is a pleasant 62 yo man with high grade heart block, s/p remote Bentall, who also has LV dysfunction. He underwent BiV PPM insertion over 12 months ago. In the interim he has done well with no other chest pain or sob. No syncope. No other complaints.  No Known Allergies   Current Outpatient Medications  Medication Sig Dispense Refill  . aspirin EC 81 MG tablet Take 81 mg by mouth daily.    . carvedilol (COREG) 3.125 MG tablet Take 1 tablet (3.125 mg total) by mouth 2 (two) times daily. Please keep upcoming appt for future refills. Thank you 180 tablet 0  . HYDROcodone-acetaminophen (NORCO/VICODIN) 5-325 MG tablet Take 1 tablet by mouth every 8 (eight) hours as needed for severe pain. 30 tablet 0  . Melatonin 10 MG CAPS Take 10 mg by mouth at bedtime as needed (sleep).     . meloxicam (MOBIC) 15 MG tablet Take 15 mg by mouth daily.     . Multiple Vitamin (MULTIVITAMIN) tablet Take 1 tablet by mouth daily.     . Omega-3 Fatty Acids (FISH OIL ULTRA) 1400 MG CAPS Take 1,400 mg by mouth daily.    . phenazopyridine (PYRIDIUM) 200 MG tablet Take 1 tablet (200 mg total) by mouth 3 (three) times daily as needed for pain. 10 tablet 0  . tamsulosin (FLOMAX) 0.4 MG CAPS capsule Take 0.4 mg by mouth 2 (two) times daily.     [name] zolpidem (AMBIEN) 10 MG tablet Take 5 mg by mouth at bedtime as needed for sleep. May take an additional 5mg s as needed for sleep if the first dose doesn't fully work     No current facility-administered medications for this visit.      Past Medical History:  Diagnosis Date  . Abnormal LFTs    a. eval in 2017 for this - abd [place] with diffuse hepatocellular disease.  . Aortic insufficiency    a. bicuspid AV/AI s/p bioprosthetic AVR and aortic root replacement 05/2009.  [name] Bicuspid aortic valve   . Bladder cancer ([place])    recurrent bladder tumor   . Depression   . Dilated aortic root ([place])   . Essential hypertension   .  Former consumption of alcohol   . Frequency of urination   . Headache    hx of cluster headaches  . Heart murmur   . Hepatocellular dysfunction   . History of aortic insufficiency   . NICM (nonischemic cardiomyopathy) ([place])    a. Previous EF 45-50% in 2009 - echo in 2014 reported EF 50-55% but upon review felt to be lower - f/u echo 07/2016 with EF 40-45%. (no CAD by cath in 2010).  . Nocturia   . Presence of permanent cardiac pacemaker    Left chest  . RBBB    hx of  . S/P aortic valve replacement with bioprosthetic valve 05-10-2009   BY DR Ricard Dillon   CARDIOLOGIST-  DR Angelena Form    ROS:   All systems reviewed and negative except as noted in the HPI.   Past Surgical History:  Procedure Laterality Date  . APPENDECTOMY  AGE 38  . BIOLOGICAL BENTALL AORTIC ROOT REPLACEMENT W/ PERICARDIAL TISSUE VALVE AND SYNTHETIC AORTIC GRAFT  05-10-2009  DR OWENS   BICUSPID AV W/ SEVERE AI &  ANEURYSM OF AORTIC ROOT AND PROXIMAL ASCENDING THORACIC AORTA  . CARDIAC CATHETERIZATION  08-10-2008  DR Angelena Form   NO EVIDENCE CAD/ MILD GLOBAL  LVSF/ MODERATE AORTIC INSUFFICIENCY/ MILD DILATION OF THE AORTIC ROOT/ EF 45-50%  . CARDIAC CATHETERIZATION N/A 08/09/2016   Procedure: Left Heart Cath and Coronary Angiography;  Surgeon: Jettie Booze, MD;  Location: [place] [facility];  Service: Cardiovascular;  Laterality: N/A;  . CATARACT EXTRACTION W/ INTRAOCULAR LENS IMPLANT Right 2015  . CYSTOSCOPY W/ RETROGRADES Bilateral 12/31/2012   Procedure: CYSTOSCOPY WITH RETROGRADE PYELOGRAM With CYSTOGRAM AND DEEP BLADDER BIOPSY ;  Surgeon: Molli Hazard, MD;  Location[facility];  Service: Urology;  Laterality: Bilateral;  . CYSTOSCOPY W/ RETROGRADES Bilateral 10/12/2015   Procedure: BILATERAL RETROGRADE PYELOGRAM AND POST OP MITOMYCIN C;  Surgeon: Ardis Hughs, MD;  Location: [facility];  Service: Urology;  Laterality: Bilateral;  . CYSTOSCOPY W/ RETROGRADES Bilateral  10/25/2016   Procedure: CYSTOSCOPY WITH RETROGRADE PYELOGRAM;  Surgeon: Ardis Hughs, MD;  [facility];  Service: Urology;  Laterality: Bilateral;  . CYSTOSCOPY WITH BIOPSY N/A 02/22/2013   Procedure: CYSTOSCOPY WITH BIOPSY;  Surgeon: Molli Hazard, MD;  [facility];  Service: Urology;  Laterality: N/A;  . CYSTOSCOPY WITH BIOPSY N/A 08/02/2013   Procedure: CYSTOSCOPY WITH BLADDER BIOPSY AND  BILATERAL RETROGRADES;  Surgeon: Molli Hazard, MD;  Location: [facility];  Service: Urology;  Laterality: N/A;  . EP IMPLANTABLE DEVICE N/A 08/09/2016   Procedure: BiV Pacemaker Insertion CRT-P;  Surgeon: Evans Lance, MD;  Location: [place] [facility];  Service: Cardiovascular;  Laterality: N/A;  . HYDROCELE EXCISION / REPAIR  2009  . TRANSTHORACIC ECHOCARDIOGRAM  07-23-2013  DR MCALHANY   mild LVH,  ef 50-55%/   NORMAL FUNCTIONING BIOPROSTHETIC AORTIC VALVE (mean granient 46mmHg, peak gradient 36mmHg)/ 69mm prosthetic aortic root/  trivial PR/  mild RAE/  mild dilated RV  . TRANSURETHRAL RESECTION OF BLADDER TUMOR N/A 12/31/2012   Procedure: TRANSURETHRAL RESECTION OF BLADDER TUMOR (TURBT);  Surgeon: Molli Hazard, MD;  Location: [facility];  Service: Urology;  Laterality: N/A;  . TRANSURETHRAL RESECTION OF BLADDER TUMOR N/A 10/12/2015   Procedure: TRANSURETHRAL RESECTION OF BLADDER TUMOR (TURBT);  Surgeon: Ardis Hughs, MD;  Location: [facility];  Service: Urology;  Laterality: N/A;  . TRANSURETHRAL RESECTION OF BLADDER TUMOR WITH MITOMYCIN-C N/A 10/25/2016   Procedure: TRANSURETHRAL RESECTION OF BLADDER TUMOR WITH MITOMYCIN-C;  Surgeon: Ardis Hughs, MD;  [facility];  Service: Urology;  Laterality: N/A;     Family History  Problem Relation Age of Onset  . Mitral valve prolapse Father      Social History   Socioeconomic History  . Marital status: Single    Spouse name: Not on  file  . Number of children: Not on file  . Years of education: Not on file  . Highest education level: Not on file  Social Needs  . Financial resource strain: Not on file  . Food insecurity - worry: Not on file  . Food insecurity - inability: Not on file  . Transportation needs - medical: Not on file  . Transportation needs - non-medical: Not on file  Occupational History  . Not on file  Tobacco Use  . Smoking status: Former Smoker    Packs/day: 2.00    Years: 25.00    Pack years: 50.00    Types: Cigarettes    Last attempt to quit: 12/30/2007    Years since quitting: 9.7  . Smokeless tobacco: Never Used  Substance and Sexual Activity  .  Alcohol use: No  . Drug use: No  . Sexual activity: Yes  Other Topics Concern  . Not on file  Social History Narrative   Tobacco use 1 1/2 ppd for 35 years- stopped 2010   No alcohol    No illicit drugs   Single    2 children     BP 136/88   Pulse 82   Ht 6' (1.829 m)   Wt 195 lb 9.6 oz (88.7 kg)   BMI 26.53 kg/m   Physical Exam:  Well appearing 62 yo man, NAD HEENT: Unremarkable Neck:  6 cm JVD, no thyromegally Lymphatics:  No adenopathy Back:  No CVA tenderness Lungs:  Clear with no wheezes HEART:  Regular rate rhythm, no murmurs, no rubs, no clicks Abd:  soft, positive bowel sounds, no organomegally, no rebound, no guarding Ext:  2 plus pulses, no edema, no cyanosis, no clubbing Skin:  No rashes no nodules Neuro:  CN II through XII intact, motor grossly intact  EKG - nsr with biv pacing  DEVICE  Normal device function.  See PaceArt for details.   Assess/Plan: 1. Chronic systolic heart failure - his symptoms are class 1. No change. 2. PPM - his medtronic BiV PPM is working normally. Will recheck in several months. 3. HTN - his blood pressure is reasonably well controlled. He remains very active.  Mikle Bosworth.D.

## 2017-10-07 NOTE — Patient Instructions (Signed)
Medication Instructions:  Your physician recommends that you continue on your current medications as directed. Please refer to the Current Medication list given to you today.  Labwork: None ordered.  Testing/Procedures: None ordered.  Follow-Up: Your physician wants you to follow-up in: one year with Dr. Lovena Le.   You will receive a reminder letter in the mail two months in advance. If you don't receive a letter, please call our office to schedule the follow-up appointment.  Remote monitoring is used to monitor your Pacemaker from home. This monitoring reduces the number of office visits required to check your device to one time per year. It allows [place] to keep an eye on the functioning of your device to ensure it is working properly. You are scheduled for a device check from home on 11/18/2017. You may send your transmission at any time that day. If you have a wireless device, the transmission will be sent automatically. After your physician reviews your transmission, you will receive a postcard with your next transmission date.  Any Other Special Instructions Will Be Listed Below (If Applicable).  If you need a refill on your cardiac medications before your next appointment, please call your pharmacy.

## 2017-11-12 ENCOUNTER — Ambulatory Visit: Payer: Self-pay | Admitting: Cardiovascular Disease

## 2017-11-18 ENCOUNTER — Ambulatory Visit (INDEPENDENT_AMBULATORY_CARE_PROVIDER_SITE_OTHER): Payer: BLUE CROSS/BLUE SHIELD | Admitting: *Deleted

## 2017-11-18 DIAGNOSIS — I5022 Chronic systolic (congestive) heart failure: Secondary | ICD-10-CM

## 2017-11-18 DIAGNOSIS — I441 Atrioventricular block, second degree: Secondary | ICD-10-CM

## 2017-11-18 NOTE — Progress Notes (Signed)
Remote pacemaker transmission.   

## 2017-11-19 LAB — CUP PACEART REMOTE DEVICE CHECK
Battery Remaining Longevity: 71 mo
Brady Statistic AP VS Percent: 0.01 %
Brady Statistic AS VP Percent: 97.41 %
Brady Statistic AS VS Percent: 0.24 %
Brady Statistic RA Percent Paced: 2.29 %
Date Time Interrogation Session: 20190416103558
Implantable Lead Implant Date: 20180105
Implantable Lead Location: 753859
Implantable Lead Location: 753860
Implantable Pulse Generator Implant Date: 20180105
Lead Channel Impedance Value: 304 Ohm
Lead Channel Impedance Value: 342 Ohm
Lead Channel Impedance Value: 380 Ohm
Lead Channel Impedance Value: 399 Ohm
Lead Channel Impedance Value: 418 Ohm
Lead Channel Impedance Value: 418 Ohm
Lead Channel Impedance Value: 627 Ohm
Lead Channel Impedance Value: 646 Ohm
Lead Channel Pacing Threshold Amplitude: 0.5 V
Lead Channel Pacing Threshold Amplitude: 0.625 V
Lead Channel Pacing Threshold Amplitude: 0.875 V
Lead Channel Pacing Threshold Pulse Width: 0.4 ms
Lead Channel Pacing Threshold Pulse Width: 0.4 ms
Lead Channel Pacing Threshold Pulse Width: 0.8 ms
Lead Channel Sensing Intrinsic Amplitude: 2.875 mV
Lead Channel Sensing Intrinsic Amplitude: 2.875 mV
Lead Channel Setting Pacing Amplitude: 2 V
Lead Channel Setting Pacing Pulse Width: 0.4 ms
Lead Channel Setting Pacing Pulse Width: 0.8 ms
Lead Channel Setting Sensing Sensitivity: 2 mV
MDC IDC LEAD IMPLANT DT: 20180105
MDC IDC LEAD IMPLANT DT: 20180105
MDC IDC LEAD LOCATION: 753858
MDC IDC MSMT BATTERY VOLTAGE: 2.99 V
MDC IDC MSMT LEADCHNL LV IMPEDANCE VALUE: 475 Ohm
MDC IDC MSMT LEADCHNL LV IMPEDANCE VALUE: 665 Ohm
MDC IDC MSMT LEADCHNL LV IMPEDANCE VALUE: 703 Ohm
MDC IDC MSMT LEADCHNL LV IMPEDANCE VALUE: 741 Ohm
MDC IDC MSMT LEADCHNL RV IMPEDANCE VALUE: 456 Ohm
MDC IDC MSMT LEADCHNL RV IMPEDANCE VALUE: 570 Ohm
MDC IDC MSMT LEADCHNL RV SENSING INTR AMPL: 7.875 mV
MDC IDC MSMT LEADCHNL RV SENSING INTR AMPL: 7.875 mV
MDC IDC SET LEADCHNL LV PACING AMPLITUDE: 3.75 V
MDC IDC SET LEADCHNL RV PACING AMPLITUDE: 2.5 V
MDC IDC STAT BRADY AP VP PERCENT: 2.34 %
MDC IDC STAT BRADY RV PERCENT PACED: 99.74 %

## 2017-11-20 ENCOUNTER — Encounter: Payer: Self-pay | Admitting: Cardiology

## 2017-12-05 ENCOUNTER — Encounter: Payer: Self-pay | Admitting: Cardiovascular Disease

## 2017-12-12 ENCOUNTER — Telehealth: Payer: Self-pay | Admitting: Cardiovascular Disease

## 2017-12-12 NOTE — Telephone Encounter (Signed)
I spoke with pt. He needs AM appointment on Tuesday or Wednesday.  I scheduled pt to see Dr. Angelena Form on May 22,2019 at 10:15.  He is upset about previous changes in appointment dates and would like to discuss with management. Will ask practice administrator to contact pt.

## 2017-12-12 NOTE — Telephone Encounter (Signed)
New message    Patient is calling upset, demanding to speak with Dr Angelena Form. Patient's last two appointments had to be changed due to provider schedule change. Patient declined to see APP. Declined to be put on waitlist. Please call

## 2017-12-17 ENCOUNTER — Ambulatory Visit: Payer: Self-pay | Admitting: Cardiovascular Disease

## 2017-12-23 NOTE — Progress Notes (Signed)
Chief Complaint  Patient presents with  . Follow-up    Aortic valve disease   History of Present Illness: 62 yo male with history of mild CAD by cath in 2018, bicuspid aortic valve now s/p aortic valve repair with aortic root replacement ([place] procedure) by Dr. Roxy Manns in 2010, high grade heart block s/p pacemaker January 2018 here today for follow up. He had AVR in 2010 and did well following that procedure. He c/o dizziness and dyspnea in January 2018 and was found to have 2:1 heart block. Cardiac cath 08/09/16 with mild non-obstructive CAD (25% RCA stenosis). Echo December 2017 with with LVEF of 40-45%,  normally functioning bioprosthetic aortic valve. Bladder tumor resected July 2014.  He is here today for follow up. The patient denies any chest pain, dyspnea, palpitations, lower extremity edema, orthopnea, PND, dizziness, near syncope or syncope. He is riding his bike 30 miles per week.   Primary Care Physician: Delilah Shan, MD  Past Medical History:  Diagnosis Date  . Abnormal LFTs    a. eval in 2017 for this - abd [place] with diffuse hepatocellular disease.  . Aortic insufficiency    a. bicuspid AV/AI s/p bioprosthetic AVR and aortic root replacement 05/2009.  [name] Bicuspid aortic valve   . Bladder cancer ([place])    recurrent bladder tumor   . Depression   . Dilated aortic root ([place])   . Essential hypertension   . Former consumption of alcohol   . Frequency of urination   . Headache    hx of cluster headaches  . Heart murmur   . Hepatocellular dysfunction   . History of aortic insufficiency   . NICM (nonischemic cardiomyopathy) ([place])    a. Previous EF 45-50% in 2009 - echo in 2014 reported EF 50-55% but upon review felt to be lower - f/u echo 07/2016 with EF 40-45%. (no CAD by cath in 2010).  . Nocturia   . Presence of permanent cardiac pacemaker    Left chest  . RBBB    hx of  . S/P aortic valve replacement with bioprosthetic valve 05-10-2009   BY DR Ricard Dillon   CARDIOLOGIST-   DR Angelena Form    Past Surgical History:  Procedure Laterality Date  . APPENDECTOMY  AGE 2  . BIOLOGICAL BENTALL AORTIC ROOT REPLACEMENT W/ PERICARDIAL TISSUE VALVE AND SYNTHETIC AORTIC GRAFT  05-10-2009  DR OWENS   BICUSPID AV W/ SEVERE AI &  ANEURYSM OF AORTIC ROOT AND PROXIMAL ASCENDING THORACIC AORTA  . CARDIAC CATHETERIZATION  08-10-2008  DR MCALHANY   NO EVIDENCE CAD/ MILD GLOBAL LVSF/ MODERATE AORTIC INSUFFICIENCY/ MILD DILATION OF THE AORTIC ROOT/ EF 45-50%  . CARDIAC CATHETERIZATION N/A 08/09/2016   Procedure: Left Heart Cath and Coronary Angiography;  Surgeon: Jettie Booze, MD;  Location: [facility];  Service: Cardiovascular;  Laterality: N/A;  . CATARACT EXTRACTION W/ INTRAOCULAR LENS IMPLANT Right 2015  . CYSTOSCOPY W/ RETROGRADES Bilateral 12/31/2012   Procedure: CYSTOSCOPY WITH RETROGRADE PYELOGRAM With CYSTOGRAM AND DEEP BLADDER BIOPSY ;  Surgeon: Molli Hazard, MD;  Location[facility];  Service: Urology;  Laterality: Bilateral;  . CYSTOSCOPY W/ RETROGRADES Bilateral 10/12/2015   Procedure: BILATERAL RETROGRADE PYELOGRAM AND POST OP MITOMYCIN C;  Surgeon: Ardis Hughs, MD;  Location: [facility];  Service: Urology;  Laterality: Bilateral;  . CYSTOSCOPY W/ RETROGRADES Bilateral 10/25/2016   Procedure: CYSTOSCOPY WITH RETROGRADE PYELOGRAM;  Surgeon: Ardis Hughs, MD;  Location: [facility];  Service:  Urology;  Laterality: Bilateral;  . CYSTOSCOPY WITH BIOPSY N/A 02/22/2013   Procedure: CYSTOSCOPY WITH BIOPSY;  Surgeon: Molli Hazard, MD;  Location: [facility];  Service: Urology;  Laterality: N/A;  . CYSTOSCOPY WITH BIOPSY N/A 08/02/2013   Procedure: CYSTOSCOPY WITH BLADDER BIOPSY AND  BILATERAL RETROGRADES;  Surgeon: Molli Hazard, MD;  Location: [facility];  Service: Urology;  Laterality: N/A;  . EP IMPLANTABLE DEVICE N/A 08/09/2016   Procedure: BiV Pacemaker Insertion CRT-P;   Surgeon: Evans Lance, MD;  Location: [facility];  Service: Cardiovascular;  Laterality: N/A;  . HYDROCELE EXCISION / REPAIR  2009  . TRANSTHORACIC ECHOCARDIOGRAM  07-23-2013  DR MCALHANY   mild LVH,  ef 50-55%/   NORMAL FUNCTIONING BIOPROSTHETIC AORTIC VALVE (mean granient 70mmHg, peak gradient 8mmHg)/ 44mm prosthetic aortic root/  trivial PR/  mild RAE/  mild dilated RV  . TRANSURETHRAL RESECTION OF BLADDER TUMOR N/A 12/31/2012   Procedure: TRANSURETHRAL RESECTION OF BLADDER TUMOR (TURBT);  Surgeon: Molli Hazard, MD;  Location[facility];  Service: Urology;  Laterality: N/A;  . TRANSURETHRAL RESECTION OF BLADDER TUMOR N/A 10/12/2015   Procedure: TRANSURETHRAL RESECTION OF BLADDER TUMOR (TURBT);  Surgeon: Ardis Hughs, MD;  Location: [facility];  Service: Urology;  Laterality: N/A;  . TRANSURETHRAL RESECTION OF BLADDER TUMOR WITH MITOMYCIN-C N/A 10/25/2016   Procedure: TRANSURETHRAL RESECTION OF BLADDER TUMOR WITH MITOMYCIN-C;  Surgeon: Ardis Hughs, MD;  Location: [facility];  Service: Urology;  Laterality: N/A;    Current Outpatient Medications  Medication Sig Dispense Refill  . aspirin EC 81 MG tablet Take 81 mg by mouth daily.    . carvedilol (COREG) 3.125 MG tablet Take 1 tablet (3.125 mg total) by mouth 2 (two) times daily. Please keep upcoming appt for future refills. Thank you 180 tablet 0  . HYDROcodone-acetaminophen (NORCO/VICODIN) 5-325 MG tablet Take 1 tablet by mouth every 8 (eight) hours as needed for severe pain. 30 tablet 0  . Melatonin 10 MG CAPS Take 10 mg by mouth at bedtime as needed (sleep).     . meloxicam (MOBIC) 15 MG tablet Take 15 mg by mouth daily.     . Multiple Vitamin (MULTIVITAMIN) tablet Take 1 tablet by mouth daily.     . Omega-3 Fatty Acids (FISH OIL ULTRA) 1400 MG CAPS Take 1,400 mg by mouth daily.    . phenazopyridine (PYRIDIUM) 200 MG tablet Take 1 tablet (200 mg total) by mouth 3 (three) times daily  as needed for pain. 10 tablet 0  . tamsulosin (FLOMAX) 0.4 MG CAPS capsule Take 0.4 mg by mouth 2 (two) times daily.     [name] zolpidem (AMBIEN) 10 MG tablet Take 5 mg by mouth at bedtime as needed for sleep. May take an additional 5mg s as needed for sleep if the first dose doesn't fully work     No current facility-administered medications for this visit.     No Known Allergies  Social History   Socioeconomic History  . Marital status: Single    Spouse name: Not on file  . Number of children: Not on file  . Years of education: Not on file  . Highest education level: Not on file  Occupational History  . Not on file  Social Needs  . Financial resource strain: Not on file  . Food insecurity:    Worry: Not on file    Inability: Not on file  . Transportation needs:  Medical: Not on file    Non-medical: Not on file  Tobacco Use  . Smoking status: Former Smoker    Packs/day: 2.00    Years: 25.00    Pack years: 50.00    Types: Cigarettes    Last attempt to quit: 12/30/2007    Years since quitting: 9.9  . Smokeless tobacco: Never Used  Substance and Sexual Activity  . Alcohol use: No  . Drug use: No  . Sexual activity: Yes  Lifestyle  . Physical activity:    Days per week: Not on file    Minutes per session: Not on file  . Stress: Not on file  Relationships  . Social connections:    Talks on phone: Not on file    Gets together: Not on file    Attends religious service: Not on file    Active member of club or organization: Not on file    Attends meetings of clubs or organizations: Not on file    Relationship status: Not on file  . Intimate partner violence:    Fear of current or ex partner: Not on file    Emotionally abused: Not on file    Physically abused: Not on file    Forced sexual activity: Not on file  Other Topics Concern  . Not on file  Social History Narrative   Tobacco use 1 1/2 ppd for 35 years- stopped 2010   No alcohol    No illicit drugs   Single     2 children    Family History  Problem Relation Age of Onset  . Mitral valve prolapse Father     Review of Systems:  As stated in the HPI and otherwise negative.   BP 140/72   Pulse 89   Ht 6' (1.829 m)   Wt 194 lb 6.4 oz (88.2 kg)   SpO2 98%   BMI 26.37 kg/m   Physical Examination:  General: Well developed, well nourished, NAD  HEENT: OP clear, mucus membranes moist  SKIN: warm, dry. No rashes. Neuro: No focal deficits  Musculoskeletal: Muscle strength 5/5 all ext  Psychiatric: Mood and affect normal  Neck: No JVD, no carotid bruits, no thyromegaly, no lymphadenopathy.  Lungs:Clear bilaterally, no wheezes, rhonci, crackles Cardiovascular: Regular rate and rhythm. No murmurs, gallops or rubs. Abdomen:Soft. Bowel sounds present. Non-tender.  Extremities: No lower extremity edema. Pulses are 2 + in the bilateral DP/PT.  Echo December 2017: Left ventricle: The cavity size was mildly dilated. Wall   thickness was normal. Systolic function was mildly to moderately   reduced. The estimated ejection fraction was in the range of 40%   to 45%. Diffuse hypokinesis. Doppler parameters are consistent   with abnormal left ventricular relaxation (grade 1 diastolic   dysfunction). - Aortic valve: Bioprosthetic aortic valve appears to function   normally. There was no regurgitation. Mean gradient (S): 11 mm   Hg. - Aorta: Status post aortic root replacement. Mildly dilated   ascending aorta. Aortic root dimension: 41 mm (ED). Ascending   aortic diameter: 38 mm (S). - Mitral valve: Mildly calcified annulus. - Right ventricle: The cavity size was mildly to moderately   dilated. Systolic function was mildly reduced. - Right atrium: The atrium was mildly dilated. - Pulmonary arteries: No complete TR doppler jet so unable to   estimate PA systolic pressure. - Inferior vena cava: The vessel was normal in size. The   respirophasic diameter changes were in the normal range (= 50%),  consistent with normal central venous pressure.  Impressions:  - Mildly dilated LV with EF 40-45%, diffuse hypokinesis. Mildly   dilated RV with mildly decreased systolic function. Bioprosthetic   aortic valve appears to function normally.  EKG:  EKG is not  ordered today. The ekg ordered today demonstrates   Recent Labs: No results found for requested labs within last 8760 hours.   Lipid Panel No results found for: CHOL, TRIG, HDL, CHOLHDL, VLDL, LDLCALC, LDLDIRECT   Wt Readings from Last 3 Encounters:  12/24/17 194 lb 6.4 oz (88.2 kg)  10/07/17 195 lb 9.6 oz (88.7 kg)  11/19/16 194 lb 3.2 oz (88.1 kg)     Other studies Reviewed: Additional studies/ records that were reviewed today include: . Review of the above records demonstrates:    Assessment and Plan:   1. AORTIC VALVE STENOSIS s/p AVR: Last echo in December 2017 with normal function of the bioprosthetic AVR. He uses antibiotics as necessary prior to procedures where SBE prophylaxis is indicated.    2. High grade heart block: s/p pacemaker January 2018. He is followed in the device clinic by Dr. Lovena Le.    3. CAD without angina: He has no chest pain. He has mild CAD by cath in January 2018. Will continue ASA and beta blocker. He has refused to take a statin.    4. HTN: BP is well controlled.   Current medicines are reviewed at length with the patient today.  The patient does not have concerns regarding medicines.  The following changes have been made:  no change  Labs/ tests ordered today include:   No orders of the defined types were placed in this encounter.  Disposition:   FU with me in 12  months  Signed, Lauree Chandler, MD 12/24/2017 11:07 AM    [facility] [address], [place], [place]  [address] Phone: [phone]; Fax: [phone]

## 2017-12-24 ENCOUNTER — Encounter: Payer: Self-pay | Admitting: Cardiovascular Disease

## 2017-12-24 ENCOUNTER — Ambulatory Visit: Payer: BLUE CROSS/BLUE SHIELD | Admitting: Cardiovascular Disease

## 2017-12-24 VITALS — BP 140/72 | HR 89 | Ht 72.0 in | Wt 194.4 lb

## 2017-12-24 DIAGNOSIS — I2510 Atherosclerotic heart disease of native coronary artery without angina pectoris: Secondary | ICD-10-CM

## 2017-12-24 DIAGNOSIS — I441 Atrioventricular block, second degree: Secondary | ICD-10-CM

## 2017-12-24 DIAGNOSIS — I10 Essential (primary) hypertension: Secondary | ICD-10-CM

## 2017-12-24 DIAGNOSIS — Q2381 Bicuspid aortic valve: Secondary | ICD-10-CM

## 2017-12-24 DIAGNOSIS — Q231 Congenital insufficiency of aortic valve: Secondary | ICD-10-CM

## 2017-12-24 NOTE — Patient Instructions (Signed)

## 2017-12-31 ENCOUNTER — Other Ambulatory Visit: Payer: Self-pay | Admitting: Cardiovascular Disease

## 2018-02-17 ENCOUNTER — Ambulatory Visit (INDEPENDENT_AMBULATORY_CARE_PROVIDER_SITE_OTHER): Payer: BLUE CROSS/BLUE SHIELD | Admitting: *Deleted

## 2018-02-17 DIAGNOSIS — I5022 Chronic systolic (congestive) heart failure: Secondary | ICD-10-CM | POA: Diagnosis not present

## 2018-02-17 DIAGNOSIS — I428 Other cardiomyopathies: Secondary | ICD-10-CM

## 2018-02-17 NOTE — Progress Notes (Signed)
Remote pacemaker transmission.   

## 2018-02-18 ENCOUNTER — Encounter: Payer: Self-pay | Admitting: Cardiology

## 2018-02-18 LAB — CUP PACEART REMOTE DEVICE CHECK
Battery Remaining Longevity: 125 mo
Brady Statistic AP VS Percent: 0.01 %
Brady Statistic AS VP Percent: 97.39 %
Brady Statistic AS VS Percent: 1.19 %
Brady Statistic RA Percent Paced: 1.44 %
Date Time Interrogation Session: 20190716092544
Implantable Lead Implant Date: 20180105
Implantable Lead Implant Date: 20180105
Implantable Lead Location: 753858
Implantable Pulse Generator Implant Date: 20180105
Lead Channel Impedance Value: 304 Ohm
Lead Channel Impedance Value: 323 Ohm
Lead Channel Impedance Value: 399 Ohm
Lead Channel Impedance Value: 399 Ohm
Lead Channel Impedance Value: 418 Ohm
Lead Channel Impedance Value: 456 Ohm
Lead Channel Impedance Value: 494 Ohm
Lead Channel Impedance Value: 608 Ohm
Lead Channel Impedance Value: 627 Ohm
Lead Channel Impedance Value: 760 Ohm
Lead Channel Pacing Threshold Amplitude: 0.5 V
Lead Channel Pacing Threshold Amplitude: 0.5 V
Lead Channel Pacing Threshold Amplitude: 0.75 V
Lead Channel Pacing Threshold Pulse Width: 0.4 ms
Lead Channel Pacing Threshold Pulse Width: 0.4 ms
Lead Channel Sensing Intrinsic Amplitude: 3.625 mV
Lead Channel Sensing Intrinsic Amplitude: 3.625 mV
Lead Channel Setting Pacing Amplitude: 2 V
Lead Channel Setting Pacing Pulse Width: 0.4 ms
Lead Channel Setting Pacing Pulse Width: 0.8 ms
MDC IDC LEAD IMPLANT DT: 20180105
MDC IDC LEAD LOCATION: 753859
MDC IDC LEAD LOCATION: 753860
MDC IDC MSMT BATTERY VOLTAGE: 3.01 V
MDC IDC MSMT LEADCHNL LV IMPEDANCE VALUE: 684 Ohm
MDC IDC MSMT LEADCHNL LV IMPEDANCE VALUE: 722 Ohm
MDC IDC MSMT LEADCHNL LV PACING THRESHOLD PULSEWIDTH: 0.8 ms
MDC IDC MSMT LEADCHNL RV IMPEDANCE VALUE: 456 Ohm
MDC IDC MSMT LEADCHNL RV IMPEDANCE VALUE: 532 Ohm
MDC IDC MSMT LEADCHNL RV SENSING INTR AMPL: 7.5 mV
MDC IDC MSMT LEADCHNL RV SENSING INTR AMPL: 7.5 mV
MDC IDC SET LEADCHNL LV PACING AMPLITUDE: 1.25 V
MDC IDC SET LEADCHNL RV PACING AMPLITUDE: 2.5 V
MDC IDC SET LEADCHNL RV SENSING SENSITIVITY: 2 mV
MDC IDC STAT BRADY AP VP PERCENT: 1.41 %
MDC IDC STAT BRADY RV PERCENT PACED: 67.95 %

## 2018-03-12 ENCOUNTER — Encounter: Payer: Self-pay | Admitting: Gastroenterology

## 2018-04-17 ENCOUNTER — Other Ambulatory Visit: Payer: Self-pay | Admitting: Urology

## 2018-05-06 NOTE — Patient Instructions (Signed)
EITHEN CASTIGLIA  05/06/2018   Your procedure is scheduled on: 05-21-18  Report to [facility] Main  Entrance  Report to admitting at      0530 AM    Call this number if you have problems the morning of surgery [phone]   Remember: Do not eat food or drink liquids :After Midnight. BRUSH YOUR TEETH MORNING OF SURGERY AND RINSE YOUR MOUTH OUT, NO CHEWING GUM CANDY OR MINTS.     Take these medicines the morning of surgery with A SIP OF WATER: flomax, carvedilol                                You may not have any metal on your body including hair pins and              piercings  Do not wear jewelry,  lotions, powders or perfumes, deodorant            .              Men may shave face and neck.   Do not bring valuables to the hospital. [place].  Contacts, dentures or bridgework may not be worn into surgery.  Leave suitcase in the car. After surgery it may be brought to your room.                Please read over the following fact sheets you were given: _____________________________________________________________________           [facility] - Preparing for Surgery Before surgery, you can play an important role.  Because skin is not sterile, your skin needs to be as free of germs as possible.  You can reduce the number of germs on your skin by washing with CHG (chlorahexidine gluconate) soap before surgery.  CHG is an antiseptic cleaner which kills germs and bonds with the skin to continue killing germs even after washing. Please DO NOT use if you have an allergy to CHG or antibacterial soaps.  If your skin becomes reddened/irritated stop using the CHG and inform your nurse when you arrive at Short Stay. Do not shave (including legs and underarms) for at least 48 hours prior to the first CHG shower.  You may shave your face/neck. Please follow these instructions carefully:  1.  Shower with CHG Soap the  night before surgery and the  morning of Surgery.  2.  If you choose to wash your hair, wash your hair first as usual with your  normal  shampoo.  3.  After you shampoo, rinse your hair and body thoroughly to remove the  shampoo.                           4.  Use CHG as you would any other liquid soap.  You can apply chg directly  to the skin and wash                       Gently with a scrungie or clean washcloth.  5.  Apply the CHG Soap to your body ONLY FROM THE NECK DOWN.   Do not use on face/ open  Wound or open sores. Avoid contact with eyes, ears mouth and genitals (private parts).                       Wash face,  Genitals (private parts) with your normal soap.             6.  Wash thoroughly, paying special attention to the area where your surgery  will be performed.  7.  Thoroughly rinse your body with warm water from the neck down.  8.  DO NOT shower/wash with your normal soap after using and rinsing off  the CHG Soap.                9.  Pat yourself dry with a clean towel.            10.  Wear clean pajamas.            11.  Place clean sheets on your bed the night of your first shower and do not  sleep with pets. Day of Surgery : Do not apply any lotions/deodorants the morning of surgery.  Please wear clean clothes to the hospital/surgery center.  FAILURE TO FOLLOW THESE INSTRUCTIONS MAY RESULT IN THE CANCELLATION OF YOUR SURGERY PATIENT SIGNATURE_________________________________  NURSE SIGNATURE__________________________________  ________________________________________________________________________

## 2018-05-12 ENCOUNTER — Encounter (HOSPITAL_COMMUNITY)
Admission: RE | Admit: 2018-05-12 | Discharge: 2018-05-12 | Disposition: A | Discharge status: Home / Self Care | Payer: BLUE CROSS/BLUE SHIELD | Source: Ambulatory Visit | Attending: Urology | Admitting: Urology

## 2018-05-12 ENCOUNTER — Encounter (HOSPITAL_COMMUNITY): Payer: Self-pay

## 2018-05-12 ENCOUNTER — Other Ambulatory Visit: Payer: Self-pay

## 2018-05-12 DIAGNOSIS — Z01818 Encounter for other preprocedural examination: Secondary | ICD-10-CM | POA: Insufficient documentation

## 2018-05-12 DIAGNOSIS — C679 Malignant neoplasm of bladder, unspecified: Secondary | ICD-10-CM | POA: Insufficient documentation

## 2018-05-12 DIAGNOSIS — N400 Enlarged prostate without lower urinary tract symptoms: Secondary | ICD-10-CM | POA: Diagnosis not present

## 2018-05-12 LAB — BASIC METABOLIC PANEL
Anion gap: 8 (ref 5–15)
BUN: 18 mg/dL (ref 8–23)
CALCIUM: 10 mg/dL (ref 8.9–10.3)
CO2: 29 mmol/L (ref 22–32)
Chloride: 107 mmol/L (ref 98–111)
Creatinine, Ser: 1.07 mg/dL (ref 0.61–1.24)
GFR calc Af Amer: >60 mL/min (ref >60)
GLUCOSE: 92 mg/dL (ref 70–99)
Potassium: 4.6 mmol/L (ref 3.5–5.1)
Sodium: 144 mmol/L (ref 135–145)

## 2018-05-12 LAB — CBC
HEMATOCRIT: 44.8 % (ref 39.0–52.0)
Hemoglobin: 15.4 g/dL (ref 13.0–17.0)
MCH: 32.5 pg (ref 26.0–34.0)
MCHC: 34.4 g/dL (ref 30.0–36.0)
MCV: 94.5 fL (ref 80.0–100.0)
PLATELETS: 229 10*3/uL (ref 150–400)
RBC: 4.74 MIL/uL (ref 4.22–5.81)
RDW: 13.4 % (ref 11.5–15.5)
WBC: 6.7 10*3/uL (ref 4.0–10.5)
nRBC: 0 % (ref 0.0–0.2)

## 2018-05-12 NOTE — Progress Notes (Addendum)
LOV 12-24-17 Dr. Angelena Form   Last device check 02-18-18 epic  ekg 10-07-17 epic  Stress 08-09-16

## 2018-05-13 LAB — URINE CULTURE: Culture: NO GROWTH

## 2018-05-19 ENCOUNTER — Ambulatory Visit (INDEPENDENT_AMBULATORY_CARE_PROVIDER_SITE_OTHER): Payer: BLUE CROSS/BLUE SHIELD | Admitting: *Deleted

## 2018-05-19 DIAGNOSIS — I428 Other cardiomyopathies: Secondary | ICD-10-CM | POA: Diagnosis not present

## 2018-05-19 DIAGNOSIS — I5022 Chronic systolic (congestive) heart failure: Secondary | ICD-10-CM

## 2018-05-20 NOTE — Progress Notes (Signed)
Remote pacemaker transmission.   

## 2018-05-20 NOTE — Anesthesia Preprocedure Evaluation (Addendum)
Anesthesia Evaluation  Patient identified by MRN, date of birth, ID band Patient awake    Reviewed: Allergy & Precautions, H&P , NPO status , Patient's Chart, lab work & pertinent test results, reviewed documented beta blocker date and time   Airway Mallampati: II  TM Distance: >3 FB Neck ROM: Full    Dental  (+) Teeth Intact, Dental Advisory Given   Pulmonary former smoker,    breath sounds clear to auscultation       Cardiovascular Exercise Tolerance: Good hypertension, + Peripheral Vascular Disease and +CHF  + dysrhythmias + pacemaker + Valvular Problems/Murmurs  Rhythm:Regular Rate:Normal  S/P AVR 2010, most recent Echo stable with normally functioning valve  ECG: RBBB   Neuro/Psych  Headaches, PSYCHIATRIC DISORDERS Depression negative neurological ROS  negative psych ROS   GI/Hepatic negative GI ROS, Neg liver ROS,   Endo/Other  negative endocrine ROS  Renal/GU negative Renal ROS  negative genitourinary   Musculoskeletal negative musculoskeletal ROS (+)   Abdominal Normal abdominal exam  (+)   Peds negative pediatric ROS (+)  Hematology negative hematology ROS (+)   Anesthesia Other Findings   Reproductive/Obstetrics negative OB ROS                            Lab Results  Component Value Date   WBC 6.7 05/12/2018   HGB 15.4 05/12/2018   HCT 44.8 05/12/2018   MCV 94.5 05/12/2018   PLT 229 05/12/2018   Lab Results  Component Value Date   CREATININE 1.07 05/12/2018   BUN 18 05/12/2018   NA 144 05/12/2018   K 4.6 05/12/2018   CL 107 05/12/2018   CO2 29 05/12/2018   Lab Results  Component Value Date   INR 1.09 09/25/2016   INR 0.96 08/09/2016   INR 1.51 (H) 05/10/2009   Echo: Left ventricle: The cavity size was mildly dilated. Wall   thickness was normal. Systolic function was mildly to moderately   reduced. The estimated ejection fraction was in the range of 40%   to  45%. Diffuse hypokinesis. Doppler parameters are consistent   with abnormal left ventricular relaxation (grade 1 diastolic   dysfunction). - Aortic valve: Bioprosthetic aortic valve appears to function   normally. There was no regurgitation. Mean gradient (S): 11 mm   Hg. - Aorta: Status post aortic root replacement. Mildly dilated   ascending aorta. Aortic root dimension: 41 mm (ED). Ascending   aortic diameter: 38 mm (S). - Mitral valve: Mildly calcified annulus. - Right ventricle: The cavity size was mildly to moderately   dilated. Systolic function was mildly reduced. - Right atrium: The atrium was mildly dilated. - Pulmonary arteries: No complete TR doppler jet so unable to   estimate PA systolic pressure. - Inferior vena cava: The vessel was normal in size. The   respirophasic diameter changes were in the normal range (= 50%),   consistent with normal central venous pressure.  Anesthesia Physical  Anesthesia Plan  ASA: III  Anesthesia Plan: General   Post-op Pain Management:    Induction: Intravenous  PONV Risk Score and Plan: 2 and Ondansetron and Midazolam  Airway Management Planned: Oral ETT  Additional Equipment: None  Intra-op Plan:   Post-operative Plan: Extubation in OR  Informed Consent: I have reviewed the patients History and Physical, chart, labs and discussed the procedure including the risks, benefits and alternatives for the proposed anesthesia with the patient or authorized representative who has indicated  his/her understanding and acceptance.   Dental advisory given  Plan Discussed with: CRNA  Anesthesia Plan Comments: (Magnet in room)       Anesthesia Quick Evaluation

## 2018-05-21 ENCOUNTER — Ambulatory Visit (HOSPITAL_COMMUNITY): Payer: BLUE CROSS/BLUE SHIELD | Admitting: Anesthesiology

## 2018-05-21 ENCOUNTER — Encounter (HOSPITAL_COMMUNITY): Payer: Self-pay

## 2018-05-21 ENCOUNTER — Ambulatory Visit (HOSPITAL_COMMUNITY): Payer: BLUE CROSS/BLUE SHIELD

## 2018-05-21 ENCOUNTER — Encounter (HOSPITAL_COMMUNITY)
Admission: RE | Disposition: A | Discharge status: Home / Self Care | Payer: Self-pay | Source: Other Acute Inpatient Hospital | Attending: Urology

## 2018-05-21 ENCOUNTER — Observation Stay (HOSPITAL_COMMUNITY)
Admission: RE | Admit: 2018-05-21 | Discharge: 2018-05-22 | Disposition: A | Discharge status: Home / Self Care | Payer: BLUE CROSS/BLUE SHIELD | Source: Other Acute Inpatient Hospital | Attending: Urology | Admitting: Urology

## 2018-05-21 ENCOUNTER — Other Ambulatory Visit: Payer: Self-pay

## 2018-05-21 DIAGNOSIS — R350 Frequency of micturition: Secondary | ICD-10-CM | POA: Insufficient documentation

## 2018-05-21 DIAGNOSIS — N320 Bladder-neck obstruction: Secondary | ICD-10-CM | POA: Diagnosis present

## 2018-05-21 DIAGNOSIS — I441 Atrioventricular block, second degree: Secondary | ICD-10-CM | POA: Diagnosis not present

## 2018-05-21 DIAGNOSIS — Z79899 Other long term (current) drug therapy: Secondary | ICD-10-CM | POA: Insufficient documentation

## 2018-05-21 DIAGNOSIS — I5022 Chronic systolic (congestive) heart failure: Secondary | ICD-10-CM | POA: Diagnosis not present

## 2018-05-21 DIAGNOSIS — Z8551 Personal history of malignant neoplasm of bladder: Secondary | ICD-10-CM | POA: Diagnosis not present

## 2018-05-21 DIAGNOSIS — Z7982 Long term (current) use of aspirin: Secondary | ICD-10-CM | POA: Insufficient documentation

## 2018-05-21 DIAGNOSIS — N401 Enlarged prostate with lower urinary tract symptoms: Secondary | ICD-10-CM | POA: Diagnosis not present

## 2018-05-21 DIAGNOSIS — R3914 Feeling of incomplete bladder emptying: Secondary | ICD-10-CM | POA: Insufficient documentation

## 2018-05-21 DIAGNOSIS — Z950 Presence of cardiac pacemaker: Secondary | ICD-10-CM | POA: Diagnosis not present

## 2018-05-21 DIAGNOSIS — Z87891 Personal history of nicotine dependence: Secondary | ICD-10-CM | POA: Diagnosis not present

## 2018-05-21 DIAGNOSIS — I110 Hypertensive heart disease with heart failure: Secondary | ICD-10-CM | POA: Insufficient documentation

## 2018-05-21 DIAGNOSIS — C675 Malignant neoplasm of bladder neck: Secondary | ICD-10-CM

## 2018-05-21 DIAGNOSIS — Q231 Congenital insufficiency of aortic valve: Secondary | ICD-10-CM | POA: Diagnosis not present

## 2018-05-21 DIAGNOSIS — I739 Peripheral vascular disease, unspecified: Secondary | ICD-10-CM | POA: Diagnosis not present

## 2018-05-21 DIAGNOSIS — Z953 Presence of xenogenic heart valve: Secondary | ICD-10-CM | POA: Diagnosis not present

## 2018-05-21 DIAGNOSIS — R3912 Poor urinary stream: Secondary | ICD-10-CM | POA: Insufficient documentation

## 2018-05-21 DIAGNOSIS — C679 Malignant neoplasm of bladder, unspecified: Secondary | ICD-10-CM | POA: Diagnosis present

## 2018-05-21 HISTORY — PX: TRANSURETHRAL RESECTION OF PROSTATE: SHX73

## 2018-05-21 HISTORY — PX: TRANSURETHRAL RESECTION OF BLADDER TUMOR: SHX2575

## 2018-05-21 SURGERY — TURBT (TRANSURETHRAL RESECTION OF BLADDER TUMOR)
Anesthesia: General

## 2018-05-21 MED ORDER — HYDROMORPHONE HCL 1 MG/ML IJ SOLN
INTRAMUSCULAR | Status: AC
  Administered 2018-05-21: 0.25 mg via INTRAVENOUS
  Filled 2018-05-21: qty 1

## 2018-05-21 MED ORDER — FLUTICASONE PROPIONATE 50 MCG/ACT NA SUSP
1.0000 | Freq: Every day | NASAL | Status: DC | PRN
  Filled 2018-05-21: qty 16

## 2018-05-21 MED ORDER — SUGAMMADEX SODIUM 200 MG/2ML IV SOLN
INTRAVENOUS | Status: DC | PRN
  Administered 2018-05-21: 200 mg via INTRAVENOUS

## 2018-05-21 MED ORDER — MEPERIDINE HCL 50 MG/ML IJ SOLN: 6.2500 mg | INTRAMUSCULAR | Status: DC | PRN

## 2018-05-21 MED ORDER — ONDANSETRON HCL 4 MG PO TABS: 4.0000 mg | ORAL_TABLET | Freq: Three times a day (TID) | ORAL | Status: DC | PRN

## 2018-05-21 MED ORDER — HYDROCODONE-ACETAMINOPHEN 5-325 MG PO TABS: 1.0000 | ORAL_TABLET | Freq: Three times a day (TID) | ORAL | 0 refills | Status: DC | PRN

## 2018-05-21 MED ORDER — EPHEDRINE SULFATE 50 MG/ML IJ SOLN
INTRAMUSCULAR | Status: DC | PRN
  Administered 2018-05-21 (×2): 5 mg via INTRAVENOUS

## 2018-05-21 MED ORDER — BELLADONNA ALKALOIDS-OPIUM 16.2-30 MG RE SUPP
RECTAL | Status: AC
  Filled 2018-05-21: qty 1

## 2018-05-21 MED ORDER — PHENYLEPHRINE 40 MCG/ML (10ML) SYRINGE FOR IV PUSH (FOR BLOOD PRESSURE SUPPORT)
PREFILLED_SYRINGE | INTRAVENOUS | Status: DC | PRN
  Administered 2018-05-21 (×3): 80 ug via INTRAVENOUS

## 2018-05-21 MED ORDER — CEFAZOLIN SODIUM-DEXTROSE 2-4 GM/100ML-% IV SOLN
2.0000 g | INTRAVENOUS | Status: AC
  Administered 2018-05-21: 2 g via INTRAVENOUS
  Filled 2018-05-21: qty 100

## 2018-05-21 MED ORDER — ROCURONIUM BROMIDE 10 MG/ML (PF) SYRINGE
PREFILLED_SYRINGE | INTRAVENOUS | Status: AC
  Filled 2018-05-21: qty 10

## 2018-05-21 MED ORDER — SODIUM CHLORIDE 0.9 % IR SOLN
3000.0000 mL | Status: DC
  Administered 2018-05-21: 3000 mL

## 2018-05-21 MED ORDER — HYDROCODONE-ACETAMINOPHEN 5-325 MG PO TABS
1.0000 | ORAL_TABLET | ORAL | Status: DC | PRN
  Administered 2018-05-21 – 2018-05-22 (×5): 2 via ORAL
  Filled 2018-05-21 (×5): qty 2

## 2018-05-21 MED ORDER — ONDANSETRON HCL 4 MG/2ML IJ SOLN
INTRAMUSCULAR | Status: AC
  Filled 2018-05-21: qty 2

## 2018-05-21 MED ORDER — PROPOFOL 10 MG/ML IV BOLUS
INTRAVENOUS | Status: AC
  Filled 2018-05-21: qty 20

## 2018-05-21 MED ORDER — EPHEDRINE 5 MG/ML INJ
INTRAVENOUS | Status: AC
  Filled 2018-05-21: qty 10

## 2018-05-21 MED ORDER — POTASSIUM CHLORIDE 2 MEQ/ML IV SOLN: INTRAVENOUS | Status: DC

## 2018-05-21 MED ORDER — LIDOCAINE 2% (20 MG/ML) 5 ML SYRINGE
INTRAMUSCULAR | Status: AC
  Filled 2018-05-21: qty 5

## 2018-05-21 MED ORDER — PHENYLEPHRINE 40 MCG/ML (10ML) SYRINGE FOR IV PUSH (FOR BLOOD PRESSURE SUPPORT)
PREFILLED_SYRINGE | INTRAVENOUS | Status: AC
  Filled 2018-05-21: qty 10

## 2018-05-21 MED ORDER — HYDROMORPHONE HCL 1 MG/ML IJ SOLN
0.2500 mg | INTRAMUSCULAR | Status: DC | PRN
  Administered 2018-05-21 (×2): 0.25 mg via INTRAVENOUS

## 2018-05-21 MED ORDER — ZOLPIDEM TARTRATE 5 MG PO TABS
5.0000 mg | ORAL_TABLET | Freq: Every evening | ORAL | Status: DC | PRN
  Administered 2018-05-21: 5 mg via ORAL
  Filled 2018-05-21: qty 1

## 2018-05-21 MED ORDER — SODIUM CHLORIDE 0.9 % IR SOLN
Status: DC | PRN
  Administered 2018-05-21: 18000 mL via INTRAVESICAL

## 2018-05-21 MED ORDER — LIDOCAINE HCL URETHRAL/MUCOSAL 2 % EX GEL
CUTANEOUS | Status: DC | PRN
  Administered 2018-05-21: 1 via URETHRAL

## 2018-05-21 MED ORDER — MIDAZOLAM HCL 2 MG/2ML IJ SOLN
INTRAMUSCULAR | Status: AC
  Filled 2018-05-21: qty 2

## 2018-05-21 MED ORDER — LIDOCAINE HCL URETHRAL/MUCOSAL 2 % EX GEL
CUTANEOUS | Status: AC
  Filled 2018-05-21: qty 5

## 2018-05-21 MED ORDER — LIDOCAINE 2% (20 MG/ML) 5 ML SYRINGE
INTRAMUSCULAR | Status: DC | PRN
  Administered 2018-05-21: 100 mg via INTRAVENOUS

## 2018-05-21 MED ORDER — 0.9 % SODIUM CHLORIDE (POUR BTL) OPTIME
TOPICAL | Status: DC | PRN
  Administered 2018-05-21: 1000 mL

## 2018-05-21 MED ORDER — LACTATED RINGERS IV SOLN: INTRAVENOUS | Status: DC

## 2018-05-21 MED ORDER — HYDROCODONE-ACETAMINOPHEN 5-325 MG PO TABS
1.0000 | ORAL_TABLET | Freq: Three times a day (TID) | ORAL | Status: DC | PRN
  Administered 2018-05-21: 1 via ORAL
  Filled 2018-05-21: qty 1

## 2018-05-21 MED ORDER — ROCURONIUM BROMIDE 100 MG/10ML IV SOLN
INTRAVENOUS | Status: DC | PRN
  Administered 2018-05-21: 50 mg via INTRAVENOUS
  Administered 2018-05-21: 10 mg via INTRAVENOUS

## 2018-05-21 MED ORDER — ASPIRIN EC 81 MG PO TBEC: 81.0000 mg | DELAYED_RELEASE_TABLET | Freq: Every day | ORAL | Status: AC

## 2018-05-21 MED ORDER — FISH OIL ULTRA 1400 MG PO CAPS: 1400.0000 mg | ORAL_CAPSULE | Freq: Two times a day (BID) | ORAL | Status: DC

## 2018-05-21 MED ORDER — MIDAZOLAM HCL 2 MG/2ML IJ SOLN
INTRAMUSCULAR | Status: DC | PRN
  Administered 2018-05-21: 2 mg via INTRAVENOUS

## 2018-05-21 MED ORDER — PROPOFOL 10 MG/ML IV BOLUS
INTRAVENOUS | Status: DC | PRN
  Administered 2018-05-21: 160 mg via INTRAVENOUS

## 2018-05-21 MED ORDER — ADULT MULTIVITAMIN W/MINERALS CH
1.0000 | ORAL_TABLET | Freq: Every day | ORAL | Status: DC
  Administered 2018-05-22: 1 via ORAL
  Filled 2018-05-21: qty 1

## 2018-05-21 MED ORDER — DEXAMETHASONE SODIUM PHOSPHATE 10 MG/ML IJ SOLN
INTRAMUSCULAR | Status: DC | PRN
  Administered 2018-05-21: 10 mg via INTRAVENOUS

## 2018-05-21 MED ORDER — BELLADONNA ALKALOIDS-OPIUM 16.2-60 MG RE SUPP
1.0000 | Freq: Three times a day (TID) | RECTAL | Status: DC | PRN
  Administered 2018-05-21 – 2018-05-22 (×2): 1 via RECTAL
  Filled 2018-05-21 (×2): qty 1

## 2018-05-21 MED ORDER — IOHEXOL 300 MG/ML  SOLN
INTRAMUSCULAR | Status: DC | PRN
  Administered 2018-05-21: 35 mL via URETHRAL

## 2018-05-21 MED ORDER — DEXAMETHASONE SODIUM PHOSPHATE 10 MG/ML IJ SOLN
INTRAMUSCULAR | Status: AC
  Filled 2018-05-21: qty 1

## 2018-05-21 MED ORDER — FENTANYL CITRATE (PF) 250 MCG/5ML IJ SOLN
INTRAMUSCULAR | Status: AC
  Filled 2018-05-21: qty 5

## 2018-05-21 MED ORDER — ONDANSETRON HCL 4 MG/2ML IJ SOLN
INTRAMUSCULAR | Status: DC | PRN
  Administered 2018-05-21: 4 mg via INTRAVENOUS

## 2018-05-21 MED ORDER — PROMETHAZINE HCL 25 MG/ML IJ SOLN: 6.2500 mg | INTRAMUSCULAR | Status: DC | PRN

## 2018-05-21 MED ORDER — BELLADONNA ALKALOIDS-OPIUM 16.2-60 MG RE SUPP
RECTAL | Status: DC | PRN
  Administered 2018-05-21: 1 via RECTAL

## 2018-05-21 MED ORDER — MORPHINE SULFATE (PF) 2 MG/ML IV SOLN
2.0000 mg | INTRAVENOUS | Status: DC | PRN
  Administered 2018-05-21 – 2018-05-22 (×5): 2 mg via INTRAVENOUS
  Filled 2018-05-21 (×5): qty 1

## 2018-05-21 MED ORDER — OMEGA-3-ACID ETHYL ESTERS 1 G PO CAPS
1.0000 g | ORAL_CAPSULE | Freq: Two times a day (BID) | ORAL | Status: DC
  Administered 2018-05-21 – 2018-05-22 (×2): 1 g via ORAL
  Filled 2018-05-21 (×2): qty 1

## 2018-05-21 MED ORDER — CARVEDILOL 3.125 MG PO TABS
3.1250 mg | ORAL_TABLET | Freq: Two times a day (BID) | ORAL | Status: DC
  Administered 2018-05-21 – 2018-05-22 (×2): 3.125 mg via ORAL
  Filled 2018-05-21 (×2): qty 1

## 2018-05-21 MED ORDER — FENTANYL CITRATE (PF) 250 MCG/5ML IJ SOLN
INTRAMUSCULAR | Status: DC | PRN
  Administered 2018-05-21: 50 ug via INTRAVENOUS
  Administered 2018-05-21: 100 ug via INTRAVENOUS

## 2018-05-21 MED ORDER — LACTATED RINGERS IV SOLN
INTRAVENOUS | Status: DC | PRN
  Administered 2018-05-21: 07:00:00 via INTRAVENOUS

## 2018-05-21 MED ORDER — KCL IN DEXTROSE-NACL 20-5-0.45 MEQ/L-%-% IV SOLN
INTRAVENOUS | Status: DC
  Administered 2018-05-21 (×2): via INTRAVENOUS
  Filled 2018-05-21 (×3): qty 1000

## 2018-05-21 MED ORDER — ADULT MULTIVITAMIN W/MINERALS CH
1.0000 | ORAL_TABLET | Freq: Every day | ORAL | Status: DC
  Administered 2018-05-21: 1 via ORAL
  Filled 2018-05-21 (×2): qty 1

## 2018-05-21 SURGICAL SUPPLY — 25 items
BAG URINE DRAINAGE (UROLOGICAL SUPPLIES) ×4 IMPLANT
BAG URO CATCHER STRL LF (MISCELLANEOUS) ×4 IMPLANT
CATH FOLEY 2WAY SLVR 30CC 24FR (CATHETERS) IMPLANT
CATH FOLEY 3WAY 30CC 22FR (CATHETERS) ×4 IMPLANT
CATH FOLEY 3WAY 30CC 24FR (CATHETERS)
CATH URTH STD 24FR FL 3W 2 (CATHETERS) IMPLANT
COVER WAND RF STERILE (DRAPES) IMPLANT
ELECT REM PT RETURN 15FT ADLT (MISCELLANEOUS) IMPLANT
EVACUATOR MICROVAS BLADDER (UROLOGICAL SUPPLIES) IMPLANT
GLOVE BIO SURGEON STRL SZ7.5 (GLOVE) ×4 IMPLANT
GOWN STRL REUS W/ TWL XL LVL3 (GOWN DISPOSABLE) IMPLANT
GOWN STRL REUS W/TWL XL LVL3 (GOWN DISPOSABLE) ×4 IMPLANT
HOLDER FOLEY CATH W/STRAP (MISCELLANEOUS) ×4 IMPLANT
LOOP CUT BIPOLAR 24F LRG (ELECTROSURGICAL) ×4 IMPLANT
MANIFOLD NEPTUNE II (INSTRUMENTS) ×4 IMPLANT
NDL SAFETY ECLIPSE 18X1.5 (NEEDLE) IMPLANT
NEEDLE HYPO 18GX1.5 SHARP (NEEDLE)
PACK CYSTO (CUSTOM PROCEDURE TRAY) ×4 IMPLANT
SET ASPIRATION TUBING (TUBING) IMPLANT
SYR 30ML LL (SYRINGE) ×4 IMPLANT
SYRINGE IRR TOOMEY STRL 70CC (SYRINGE) ×4 IMPLANT
TUBING CONNECTING 10 (TUBING) ×3 IMPLANT
TUBING CONNECTING 10' (TUBING) ×1
TUBING UROLOGY SET (TUBING) ×4 IMPLANT
WATER STERILE IRR 3000ML UROMA (IV SOLUTION) IMPLANT
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
## 2018-05-21 NOTE — Transfer of Care (Signed)
Immediate Anesthesia Transfer of Care Note  Patient: [name]  Procedure(s) Performed: CYSTOSCOPY, TRANSURETHRAL RESECTION OF BLADDER TUMOR (TURBT), BILATERAL RETROGRADE PYELOGRAMS (Bilateral ) TRANSURETHRAL RESECTION OF THE PROSTATE (TURP) (N/A )  Patient Location: PACU  Anesthesia Type:General  Level of Consciousness: awake, alert  and oriented  Airway & Oxygen Therapy: Patient Spontanous Breathing and Patient connected to face mask oxygen  Post-op Assessment: Report given to RN and Post -op Vital signs reviewed and stable  Post vital signs: Reviewed and stable  Last Vitals:  Vitals Value Taken Time  BP 132/90 05/21/2018  9:18 AM  Temp    Pulse 93 05/21/2018  9:19 AM  Resp 8 05/21/2018  9:19 AM  SpO2 100 % 05/21/2018  9:19 AM  Vitals shown include unvalidated device data.  Last Pain:  Vitals:   05/21/18 0609  TempSrc:   PainSc: 0-No pain         Complications: No apparent anesthesia complications

## 2018-05-21 NOTE — Anesthesia Postprocedure Evaluation (Signed)
Anesthesia Post Note  Patient: [name]  Procedure(s) Performed: CYSTOSCOPY, TRANSURETHRAL RESECTION OF BLADDER TUMOR (TURBT), BILATERAL RETROGRADE PYELOGRAMS (Bilateral ) TRANSURETHRAL RESECTION OF THE PROSTATE (TURP) (N/A )     Patient location during evaluation: PACU Anesthesia Type: General Level of consciousness: awake and alert Pain management: pain level controlled Vital Signs Assessment: post-procedure vital signs reviewed and stable Respiratory status: spontaneous breathing, nonlabored ventilation, respiratory function stable and patient connected to nasal cannula oxygen Cardiovascular status: blood pressure returned to baseline and stable Postop Assessment: no apparent nausea or vomiting Anesthetic complications: no    Last Vitals:  Vitals:   05/21/18 1015 05/21/18 1030  BP: 119/85 125/89  Pulse: 68 74  Resp: 14 17  Temp: 37.2 C   SpO2: 98% 98%    Last Pain:  Vitals:   05/21/18 1231  TempSrc:   PainSc: Barry Angelicia Lessner

## 2018-05-21 NOTE — Discharge Instructions (Signed)
Transurethral Resection of the Prostate (TURP) or Greenlight laser ablation of the Prostate ° °Care After ° °Refer to this sheet in the next few weeks. These discharge instructions provide you with general information on caring for yourself after you leave the hospital. Your caregiver may also give you specific instructions. Your treatment has been planned according to the most current medical practices available, but unavoidable complications sometimes occur. If you have any problems or questions after discharge, please call your caregiver. ° °HOME CARE INSTRUCTIONS  ° °Medications °· You may receive medicine for pain management. As your level of discomfort decreases, adjustments in your pain medicines may be made.  °· Take all medicines as directed.  °· You may be given a medicine (antibiotic) to kill germs following surgery. Finish all medicines. Let your caregiver know if you have any side effects or problems from the medicine.  °· If you are on aspirin, it would be best not to restart the aspirin until the blood in the urine clears °Hygiene °· You can take a shower after surgery.  °· You should not take a bath while you still have the urethral catheter. °Activity °· You will be encouraged to get out of bed as much as possible and increase your activity level as tolerated.  °· Spend the first week in and around your home. For 3 weeks, avoid the following:  °· Straining.  °· Running.  °· Strenuous work.  °· Walks longer than a few blocks.  °· Riding for extended periods.  °· Sexual relations.  °· Do not lift heavy objects (more than 20 pounds) for at least 1 month. When lifting, use your arms instead of your abdominal muscles.  °· You will be encouraged to walk as tolerated. Do not exert yourself. Increase your activity level slowly. Remember that it is important to keep moving after an operation of any type. This cuts down on the possibility of developing blood clots.  °· Your caregiver will tell you when you  can resume driving and light housework. Discuss this at your first office visit after discharge. °Diet °· No special diet is ordered after a TURP. However, if you are on a special diet for another medical problem, it should be continued.  °· Normal fluid intake is usually recommended.  °· Avoid alcohol and caffeinated drinks for 2 weeks. They irritate the bladder. Decaffeinated drinks are okay.  °· Avoid spicy foods.  °Bladder Function °· For the first 10 days, empty the bladder whenever you feel a definite desire. Do not try to hold the urine for long periods of time.  °· Urinating once or twice a night even after you are healed is not uncommon.  °· You may see some recurrence of blood in the urine after discharge from the hospital. This usually happens within 2 weeks after the procedure.If this occurs, force fluids again as you did in the hospital and reduce your activity.  °Bowel Function °· You may experience some constipation after surgery. This can be minimized by increasing fluids and fiber in your diet. Drink enough water and fluids to keep your urine clear or pale yellow.  °· A stool softener may be prescribed for use at home. Do not strain to move your bowels.  °· If you are requiring increased pain medicine, it is important that you take stool softeners to prevent constipation. This will help to promote proper healing by reducing the need to strain to move your bowels.  °Sexual Activity °· Semen movement   after intercourse. Or, you may not have an ejaculation during erection. Ask your caregiver when you can resume sexual activity. Retrograde ejaculation and reduced semen discharge should not reduce one's pleasure of intercourse.  Postoperative Visit Arrange the date and time of your after surgery visit with your caregiver.  Return to Work After your recovery is complete, you will  be able to return to work and resume all activities. Your caregiver will inform you when you can return to work.  

## 2018-05-21 NOTE — Op Note (Signed)
Preoperative diagnosis:  1. Bladder outlet obstruction 2. Recurrent Bladder Cancer  Postoperative diagnosis:  1. same   Procedure:  1. Transurethral resection of prostate 2. Resection of bladder tumor - bladder neck, 1 cm 3. Bilateral retrograde pyelograms w/ interpretation  Surgeon: Ardis Hughs, MD   Anesthesia: General   Complications: None   Intraoperative findings: Cystoscopy demonstrated small papillary tumor at the 6 o'clock position at the bladder neck ~1cm.  Left ureter was orthotopic.  Right ureteral orifice had previously been resected and was slightly more posterior, the native orifice is still present but not in continuity w/ ureter.  Both retrograde pyelograms were normal - demonstrating normal caliber and no filling defects, with sharp calyces.  The prostate demonstrated a kissing lateral lobes with low anterior component creating a narrow bladder neck.  EBL: 100cc   Specimens:  #1. Prostate chips #2.  Bladder tumor, 6 o'clock position of the bladder neck.  Indication: [name] is a 62 y.o. patient with recurrent bladder tumor and bladder outlet obstruction with obstructive voiding symptoms.  After reviewing the management options for treatment, he elected to proceed with the above surgical procedure(s). We have discussed the potential benefits and risks of the procedure, side effects of the proposed treatment, the likelihood of the patient achieving the goals of the procedure, and any potential problems that might occur during the procedure or recuperation. Informed consent has been obtained.  Description of procedure:  The patient was taken to the operating room and general anesthesia was induced. The patient was placed in the dorsal lithotomy position, prepped and draped in the usual sterile fashion, and preoperative antibiotics were administered. A preoperative time-out was performed.   I then gently passed the 21 French 30 cystoscope into the  patient's urethra and up into the bladder under visual guidance. A 360 cystoscopic evaluation was then performed with the above findings.  At this point he is a 5 [place] open-ended ureteral catheter and 10 cc of Omnipaque contrast per side and performed bilateral retrograde pyelograms in the usual fashion with the above findings.  I then removed the 21 French cystoscopic sheath and replacement with a 26 French resectoscope sheath was passed and using the visual obturator under direct vision. An exchange the obturator for the resectoscope itself and the loop cautery.  I then resected the bladder tumor at the 6 o'clock position and remove the specimen, sending it separately.  I then proceeded to create grooves within the prostate at the 7:00 position extending the defect from the bladder neck at 7:00 down to the prostate apex just lateral to the verumontanum. I took this groove down to the capsule. I then performed a similar maneuver on the patient's left side at the 5:00 position taking the prostate down from the bladder neck to the apex and down the prostatic capsule. At this point I proceeded to resect the patient's right lateral lobe in a systematic fashion moving from the 7:00 position up to approximately 11. The resection was taken down to the prostate capsule. Hemostasis was achieved on this side prior to moving to the patient's left lateral lobe. A similar sequence was performed taking the prostate down from the 5:00 position to approximately the 1:00 position to the level of the prostatic capsule. I then completed the resection of the posterior wall as well as the area between 5:00 and 7:00 along the bladder neck. I was careful not to undermine the bladder neck or resect the inferior. I then did resect  the area that was protruding into the prostatic urethra anteriorly.   Once I was satisfied that the prostate had been adequately resected I evacuated the prostate chips using a [place] syringe. I then  reintroduced the resectoscope and ensured adequate hemostasis. I then left the bladder full and removed the resectoscope entirely. Exam under anesthesia demonstrated a normal sized prostate with no nodules.  I then placed a 22 French three-way Foley catheter. I irrigated the catheter gently and removed all debris and clots. I then placed the patient on gentle continuous bladder irrigation.  He subsequently extubated and returned to PACU in excellent condition.  Ardis Hughs, MD

## 2018-05-21 NOTE — Progress Notes (Signed)
Received patient from PACU, alert and oriented, VS obtained, telemetry monitor applied, foley catheter w/CBI intact, oriented to unit, call light placed in reach

## 2018-05-21 NOTE — Anesthesia Procedure Notes (Signed)
Procedure Name: Intubation Date/Time: 05/21/2018 7:41 AM Performed by: Sharlette Dense, CRNA Patient Re-evaluated:Patient Re-evaluated prior to induction Oxygen Delivery Method: Circle system utilized Preoxygenation: Pre-oxygenation with 100% oxygen Induction Type: IV induction Ventilation: Mask ventilation without difficulty and Oral airway inserted - appropriate to patient size Laryngoscope Size: Miller and 3 Grade View: Grade I Tube type: Oral Tube size: 8.0 mm Number of attempts: 1 Airway Equipment and Method: Stylet Placement Confirmation: ETT inserted through vocal cords under direct vision,  positive ETCO2 and breath sounds checked- equal and bilateral Secured at: 22 cm Tube secured with: Tape Dental Injury: Teeth and Oropharynx as per pre-operative assessment

## 2018-05-22 ENCOUNTER — Encounter (HOSPITAL_COMMUNITY): Payer: Self-pay | Admitting: Urology

## 2018-05-22 DIAGNOSIS — N320 Bladder-neck obstruction: Secondary | ICD-10-CM | POA: Diagnosis not present

## 2018-05-22 LAB — CBC
HEMATOCRIT: 39.8 % (ref 39.0–52.0)
HEMOGLOBIN: 13.3 g/dL (ref 13.0–17.0)
MCH: 32 pg (ref 26.0–34.0)
MCHC: 33.4 g/dL (ref 30.0–36.0)
MCV: 95.9 fL (ref 80.0–100.0)
Platelets: 182 10*3/uL (ref 150–400)
RBC: 4.15 MIL/uL — AB (ref 4.22–5.81)
RDW: 13.2 % (ref 11.5–15.5)
WBC: 11.4 10*3/uL — ABNORMAL HIGH (ref 4.0–10.5)
nRBC: 0 % (ref 0.0–0.2)

## 2018-05-22 LAB — BASIC METABOLIC PANEL
ANION GAP: 7 (ref 5–15)
BUN: 15 mg/dL (ref 8–23)
CHLORIDE: 107 mmol/L (ref 98–111)
CO2: 27 mmol/L (ref 22–32)
Calcium: 8.8 mg/dL — ABNORMAL LOW (ref 8.9–10.3)
Creatinine, Ser: 1 mg/dL (ref 0.61–1.24)
GFR calc Af Amer: >60 mL/min (ref >60)
GFR calc non Af Amer: >60 mL/min (ref >60)
GLUCOSE: 115 mg/dL — AB (ref 70–99)
POTASSIUM: 4.2 mmol/L (ref 3.5–5.1)
Sodium: 141 mmol/L (ref 135–145)

## 2018-05-22 MED ORDER — PHENAZOPYRIDINE HCL 200 MG PO TABS
200.0000 mg | ORAL_TABLET | Freq: Once | ORAL | Status: AC
  Administered 2018-05-22: 200 mg via ORAL
  Filled 2018-05-22: qty 1

## 2018-05-22 MED ORDER — PHENAZOPYRIDINE HCL 200 MG PO TABS: 200.0000 mg | ORAL_TABLET | Freq: Three times a day (TID) | ORAL | 0 refills | Status: DC | PRN

## 2018-05-22 NOTE — Discharge Summary (Signed)
Date of admission: 05/21/2018  Date of discharge: 05/22/2018  Admission diagnosis: Recurrent bladder cancer, Benign Prostatic Hypertrophy  Discharge diagnosis: same  Secondary diagnoses:  Patient Active Problem List   Diagnosis Date Noted  . Bladder cancer ([place]) 05/21/2018  . Pubic bone fracture ([place]) 09/25/2016  . Pubic ramus fracture ([place]) 09/25/2016  . Acetabular fracture ([place]) 09/25/2016  . Chronic systolic CHF (congestive heart failure) ([place]) 09/25/2016  . Heart block AV second degree 08/09/2016  . Mobitz type 2 second degree atrioventricular block 08/09/2016  . Abnormal stress test   . Bicuspid aortic valve   . Aortic insufficiency   . S/P aortic valve replacement with bioprosthetic valve   . NICM (nonischemic cardiomyopathy) ([place])   . RBBB   . Essential hypertension   . AORTIC VALVE DISORDERS 01/30/2009    Procedures performed: Procedure(s): CYSTOSCOPY, TRANSURETHRAL RESECTION OF BLADDER TUMOR (TURBT), BILATERAL RETROGRADE PYELOGRAMS TRANSURETHRAL RESECTION OF THE PROSTATE (TURP)  History and Physical: For full details, please see admission history and physical. Briefly, [name] is a 62 y.o. year old patient with with recurrent bladder cancer and obstructive voiding symptoms.   Hospital Course: Patient tolerated the procedure well.  He was then transferred to the floor after an uneventful PACU stay.  His hospital course was uncomplicated.  On POD#1 he had met discharge criteria: was eating a regular diet, was up and ambulating independently,  pain was well controlled, was voiding without a catheter, and was ready to for discharge.   Laboratory values:  Recent Labs    05/22/18 0357  WBC 11.4*  HGB 13.3  HCT 39.8   Recent Labs    05/22/18 0357  NA 141  K 4.2  CL 107  CO2 27  GLUCOSE 115*  BUN 15  CREATININE 1.00  CALCIUM 8.8*   No results for input(s): LABPT, INR in the last 72 hours. No results for input(s): LABURIN in the last 72  hours. Results for orders placed or performed during the hospital encounter of 05/12/18  Urine culture     Status: None   Collection Time: 05/12/18  9:14 AM  Result Value Ref Range Status   Specimen Description   Final    URINE, CLEAN CATCH Performed at [facility], [place] [address], [place], [place] [address]    Special Requests   Final    NONE Performed at [facility], [place] [address], [place], [place] [address]    Culture   Final    NO GROWTH Performed at [facility], [place] [address], [place], [place] [address]    Report Status 05/13/2018 FINAL  Final    Disposition: Home  Discharge instruction: The patient was instructed to be ambulatory but told to refrain from heavy lifting, strenuous activity, or driving.   Discharge medications:  Allergies as of 05/22/2018   No Known Allergies     Medication List    TAKE these medications   aspirin EC 81 MG tablet Take 1 tablet (81 mg total) by mouth daily. Resume 48 hours after surgery or when no bleeding in the urine or from the urethra. What changed:  additional instructions   carvedilol 3.125 MG tablet Commonly known as:  COREG TAKE ONE TABLET BY MOUTH TWICE A DAY What changed:  when to take this   FISH OIL ULTRA 1400 MG Caps Take 1,400 mg by mouth 2 (two) times daily.   fluticasone 50 MCG/ACT nasal spray Commonly known as:  FLONASE Place 1 spray  into both nostrils daily as needed for allergies or rhinitis.   HYDROcodone-acetaminophen 5-325 MG tablet Commonly known as:  NORCO/VICODIN Take 1-2 tablets by mouth every 8 (eight) hours as needed for severe pain. What changed:  how much to take   Melatonin 10 MG Caps Take 10 mg by mouth at bedtime as needed (for sleep).   meloxicam 15 MG tablet Commonly known as:  MOBIC Take 15 mg by mouth daily.   multivitamin tablet Take 1 tablet by mouth daily.   phenazopyridine 200 MG tablet Commonly known as:  PYRIDIUM Take 1 tablet  (200 mg total) by mouth 3 (three) times daily as needed for pain.   zolpidem 10 MG tablet Commonly known as:  AMBIEN Take 5 mg by mouth See admin instructions. Take 5 mg by mouth at bedtime. May take an additional 5 mg by mouth as needed for sleep if the first dose doesn't fully work       Followup:  Follow-up Information    Karen Kays, NP On 06/18/2018.   Specialty:  Nurse Practitioner Why:  8:30am Contact information: [place] [address] [phone]        Ardis Hughs, MD In 3 months.   Specialty:  Urology Contact information: [place] [address] [phone]

## 2018-06-13 LAB — CUP PACEART REMOTE DEVICE CHECK
Battery Voltage: 3 V
Brady Statistic AS VP Percent: 98 %
Brady Statistic AS VS Percent: 1.44 %
Brady Statistic RV Percent Paced: 61.42 %
Implantable Lead Implant Date: 20180105
Implantable Lead Location: 753859
Implantable Lead Location: 753860
Implantable Lead Model: 5076
Implantable Pulse Generator Implant Date: 20180105
Lead Channel Impedance Value: 399 Ohm
Lead Channel Impedance Value: 418 Ohm
Lead Channel Impedance Value: 456 Ohm
Lead Channel Impedance Value: 551 Ohm
Lead Channel Impedance Value: 760 Ohm
Lead Channel Pacing Threshold Amplitude: 0.5 V
Lead Channel Pacing Threshold Pulse Width: 0.4 ms
Lead Channel Pacing Threshold Pulse Width: 0.8 ms
Lead Channel Sensing Intrinsic Amplitude: 3.875 mV
Lead Channel Setting Pacing Amplitude: 2 V
Lead Channel Setting Pacing Amplitude: 2.5 V
Lead Channel Setting Sensing Sensitivity: 2 mV
MDC IDC LEAD IMPLANT DT: 20180105
MDC IDC LEAD IMPLANT DT: 20180105
MDC IDC LEAD LOCATION: 753858
MDC IDC MSMT BATTERY REMAINING LONGEVITY: 122 mo
MDC IDC MSMT LEADCHNL LV IMPEDANCE VALUE: 342 Ohm
MDC IDC MSMT LEADCHNL LV IMPEDANCE VALUE: 456 Ohm
MDC IDC MSMT LEADCHNL LV IMPEDANCE VALUE: 513 Ohm
MDC IDC MSMT LEADCHNL LV IMPEDANCE VALUE: 646 Ohm
MDC IDC MSMT LEADCHNL LV IMPEDANCE VALUE: 665 Ohm
MDC IDC MSMT LEADCHNL LV IMPEDANCE VALUE: 703 Ohm
MDC IDC MSMT LEADCHNL LV IMPEDANCE VALUE: 798 Ohm
MDC IDC MSMT LEADCHNL LV PACING THRESHOLD AMPLITUDE: 0.75 V
MDC IDC MSMT LEADCHNL RA IMPEDANCE VALUE: 304 Ohm
MDC IDC MSMT LEADCHNL RA SENSING INTR AMPL: 3.875 mV
MDC IDC MSMT LEADCHNL RV IMPEDANCE VALUE: 494 Ohm
MDC IDC MSMT LEADCHNL RV PACING THRESHOLD AMPLITUDE: 0.5 V
MDC IDC MSMT LEADCHNL RV PACING THRESHOLD PULSEWIDTH: 0.4 ms
MDC IDC MSMT LEADCHNL RV SENSING INTR AMPL: 7.625 mV
MDC IDC MSMT LEADCHNL RV SENSING INTR AMPL: 7.625 mV
MDC IDC SESS DTM: 20191015095419
MDC IDC SET LEADCHNL LV PACING AMPLITUDE: 1.25 V
MDC IDC SET LEADCHNL LV PACING PULSEWIDTH: 0.8 ms
MDC IDC SET LEADCHNL RV PACING PULSEWIDTH: 0.4 ms
MDC IDC STAT BRADY AP VP PERCENT: 0.54 %
MDC IDC STAT BRADY AP VS PERCENT: 0.01 %
MDC IDC STAT BRADY RA PERCENT PACED: 0.6 %

## 2018-06-23 ENCOUNTER — Encounter: Payer: Self-pay | Admitting: Gastroenterology

## 2018-07-07 ENCOUNTER — Ambulatory Visit (AMBULATORY_SURGERY_CENTER): Payer: Self-pay

## 2018-07-07 ENCOUNTER — Encounter: Payer: Self-pay | Admitting: Gastroenterology

## 2018-07-07 VITALS — Ht 72.0 in | Wt 193.4 lb

## 2018-07-07 DIAGNOSIS — Z1211 Encounter for screening for malignant neoplasm of colon: Secondary | ICD-10-CM

## 2018-07-07 MED ORDER — NA SULFATE-K SULFATE-MG SULF 17.5-3.13-1.6 GM/177ML PO SOLN: 1.0000 | Freq: Once | ORAL | 0 refills | Status: AC

## 2018-07-07 NOTE — Progress Notes (Signed)
Per pt, no allergies to soy or egg products.Pt not taking any weight loss meds or using  O2 at home.  Pt refused emmi video. 

## 2018-07-15 ENCOUNTER — Encounter: Payer: Self-pay | Admitting: Gastroenterology

## 2018-07-15 ENCOUNTER — Ambulatory Visit (AMBULATORY_SURGERY_CENTER): Payer: BLUE CROSS/BLUE SHIELD | Admitting: Gastroenterology

## 2018-07-15 VITALS — BP 112/77 | HR 75 | Temp 97.1°F | Resp 13 | Ht 72.0 in | Wt 193.0 lb

## 2018-07-15 DIAGNOSIS — D123 Benign neoplasm of transverse colon: Secondary | ICD-10-CM

## 2018-07-15 DIAGNOSIS — K635 Polyp of colon: Secondary | ICD-10-CM

## 2018-07-15 DIAGNOSIS — Z1211 Encounter for screening for malignant neoplasm of colon: Secondary | ICD-10-CM

## 2018-07-15 MED ORDER — SODIUM CHLORIDE 0.9 % IV SOLN: 500.0000 mL | Freq: Once | INTRAVENOUS | Status: DC

## 2018-07-15 NOTE — Patient Instructions (Signed)
Await pathology results. Continue present medications. Please read handouts provided.     YOU HAD AN ENDOSCOPIC PROCEDURE TODAY AT THE [facility]:   Refer to the procedure report that was given to you for any specific questions about what was found during the examination.  If the procedure report does not answer your questions, please call your gastroenterologist to clarify.  If you requested that your care partner not be given the details of your procedure findings, then the procedure report has been included in a sealed envelope for you to review at your convenience later.  YOU SHOULD EXPECT: Some feelings of bloating in the abdomen. Passage of more gas than usual.  Walking can help get rid of the air that was put into your GI tract during the procedure and reduce the bloating. If you had a lower endoscopy (such as a colonoscopy or flexible sigmoidoscopy) you may notice spotting of blood in your stool or on the toilet paper. If you underwent a bowel prep for your procedure, you may not have a normal bowel movement for a few days.  Please Note:  You might notice some irritation and congestion in your nose or some drainage.  This is from the oxygen used during your procedure.  There is no need for concern and it should clear up in a day or so.  SYMPTOMS TO REPORT IMMEDIATELY:   Following lower endoscopy (colonoscopy or flexible sigmoidoscopy):  Excessive amounts of blood in the stool  Significant tenderness or worsening of abdominal pains  Swelling of the abdomen that is new, acute  Fever of 100F or higher    For urgent or emergent issues, a gastroenterologist can be reached at any hour by calling [phone].   DIET:  We do recommend a small meal at first, but then you may proceed to your regular diet.  Drink plenty of fluids but you should avoid alcoholic beverages for 24 hours.  ACTIVITY:  You should plan to take it easy for the rest of today and you should NOT DRIVE  or use heavy machinery until tomorrow (because of the sedation medicines used during the test).    FOLLOW UP: Our staff will call the number listed on your records the next business day following your procedure to check on you and address any questions or concerns that you may have regarding the information given to you following your procedure. If we do not reach you, we will leave a message.  However, if you are feeling well and you are not experiencing any problems, there is no need to return our call.  We will assume that you have returned to your regular daily activities without incident.  If any biopsies were taken you will be contacted by phone or by letter within the next 1-3 weeks.  Please call us at [phone] if you have not heard about the biopsies in 3 weeks.    SIGNATURES/CONFIDENTIALITY: You and/or your care partner have signed paperwork which will be entered into your electronic medical record.  These signatures attest to the fact that that the information above on your After Visit Summary has been reviewed and is understood.  Full responsibility of the confidentiality of this discharge information lies with you and/or your care-partner. 

## 2018-07-15 NOTE — Op Note (Signed)
[facility] Patient Name: [name] Procedure Date: 07/15/2018 3:12 PM MRN: [id number] Endoscopist: Remo Lipps P. Havery Moros , MD Age: 62 Referring MD:  Date of Birth: 01/02/1956 Gender: Male Account #: [id number] Procedure:                Colonoscopy Indications:              Screening for colorectal malignant neoplasm Medicines:                Monitored Anesthesia Care Procedure:                Pre-Anesthesia Assessment:                           - Prior to the procedure, a History and Physical                            was performed, and patient medications and                            allergies were reviewed. The patient's tolerance of                            previous anesthesia was also reviewed. The risks                            and benefits of the procedure and the sedation                            options and risks were discussed with the patient.                            All questions were answered, and informed consent                            was obtained. Prior Anticoagulants: The patient has                            taken no previous anticoagulant or antiplatelet                            agents. ASA Grade Assessment: III - A patient with                            severe systemic disease. After reviewing the risks                            and benefits, the patient was deemed in                            satisfactory condition to undergo the procedure.                           After obtaining informed consent, the colonoscope  was passed under direct vision. Throughout the                            procedure, the patient's blood pressure, pulse, and                            oxygen saturations were monitored continuously. The                            Model CF-HQ190L [id number]) scope was introduced                            through the anus and advanced to the the cecum,   identified by appendiceal orifice and ileocecal                            valve. The colonoscopy was performed without                            difficulty. The patient tolerated the procedure                            well. The quality of the bowel preparation was                            adequate. The ileocecal valve, appendiceal orifice,                            and rectum were photographed. Scope In: 3:24:07 PM Scope Out: 3:49:29 PM Scope Withdrawal Time: 0 hours 16 minutes 38 seconds  Total Procedure Duration: 0 hours 25 minutes 22 seconds  Findings:                 The perianal and digital rectal examinations were                            normal.                           A large amount of liquid stool was found in the                            entire colon, making visualization difficult. Prep                            was initially only fair. Several minutes were spent                            to lavage the colon using copious amounts of                            sterile water, resulting in clearance with adequate                            visualization.  A 5 to 6 mm polyp was found in the hepatic flexure.                            The polyp was sessile. The polyp was removed with a                            cold snare. Resection and retrieval were complete.                           A 6 mm polyp was found in the transverse colon. The                            polyp was sessile. The polyp was removed with a                            cold snare. Resection and retrieval were complete.                           Multiple small-mouthed diverticula were found in                            the sigmoid colon and ascending colon.                           The colon was tortuous.                           Internal hemorrhoids were found during                            retroflexion. The hemorrhoids were small.                           The exam  was otherwise without abnormality. Complications:            No immediate complications. Estimated blood loss:                            Minimal. Estimated Blood Loss:     Estimated blood loss was minimal. Impression:               - Stool in the entire examined colon, time take to                            lavage in order to obtain adequate visualization.                           - One 5 to 6 mm polyp at the hepatic flexure,                            removed with a cold snare. Resected and retrieved.                           - One 6 mm  polyp in the transverse colon, removed                            with a cold snare. Resected and retrieved.                           - Diverticulosis in the sigmoid colon and in the                            ascending colon.                           - Tortuous colon.                           - Internal hemorrhoids.                           - The examination was otherwise normal. Recommendation:           - Patient has a contact number available for                            emergencies. The signs and symptoms of potential                            delayed complications were discussed with the                            patient. Return to normal activities tomorrow.                            Written discharge instructions were provided to the                            patient.                           - Resume previous diet.                           - Continue present medications.                           - Await pathology results. Remo Lipps P. Francena Zender, MD 07/15/2018 3:54:33 PM This report has been signed electronically.

## 2018-07-15 NOTE — Progress Notes (Signed)
PT taken to PACU. Monitors in place. VSS. Report given to RN. 

## 2018-07-15 NOTE — Progress Notes (Signed)
Called to room to assist during endoscopic procedure.  Patient ID and intended procedure confirmed with present staff. Received instructions for my participation in the procedure from the performing physician.  

## 2018-07-16 ENCOUNTER — Telehealth: Payer: Self-pay | Admitting: *Deleted

## 2018-07-16 NOTE — Telephone Encounter (Signed)
  Follow up Call-  Call back number 07/15/2018  Post procedure Call Back phone  # [phone]  Permission to leave phone message Yes  Some recent data might be hidden     Patient questions:  Do you have a fever, pain , or abdominal swelling? No. Pain Score  0 *  Have you tolerated food without any problems? Yes.    Have you been able to return to your normal activities? Yes.    Do you have any questions about your discharge instructions: Diet   No. Medications  No. Follow up visit  No.  Do you have questions or concerns about your Care? No.  Actions: * If pain score is 4 or above: No action needed, pain <4.

## 2018-07-21 ENCOUNTER — Encounter: Payer: Self-pay | Admitting: Gastroenterology

## 2018-08-18 ENCOUNTER — Ambulatory Visit (INDEPENDENT_AMBULATORY_CARE_PROVIDER_SITE_OTHER): Payer: BLUE CROSS/BLUE SHIELD

## 2018-08-18 DIAGNOSIS — I428 Other cardiomyopathies: Secondary | ICD-10-CM

## 2018-08-18 DIAGNOSIS — I441 Atrioventricular block, second degree: Secondary | ICD-10-CM | POA: Diagnosis not present

## 2018-08-19 NOTE — Progress Notes (Signed)
Remote pacemaker transmission.   

## 2018-08-22 LAB — CUP PACEART REMOTE DEVICE CHECK
Battery Voltage: 3 V
Brady Statistic AP VP Percent: 0.28 %
Brady Statistic RA Percent Paced: 0.31 %
Implantable Lead Implant Date: 20180105
Implantable Lead Location: 753858
Implantable Lead Location: 753859
Implantable Lead Model: 5076
Implantable Pulse Generator Implant Date: 20180105
Lead Channel Impedance Value: 304 Ohm
Lead Channel Impedance Value: 399 Ohm
Lead Channel Impedance Value: 399 Ohm
Lead Channel Impedance Value: 475 Ohm
Lead Channel Impedance Value: 475 Ohm
Lead Channel Impedance Value: 532 Ohm
Lead Channel Impedance Value: 608 Ohm
Lead Channel Impedance Value: 722 Ohm
Lead Channel Impedance Value: 760 Ohm
Lead Channel Pacing Threshold Amplitude: 0.5 V
Lead Channel Pacing Threshold Pulse Width: 0.4 ms
Lead Channel Pacing Threshold Pulse Width: 0.8 ms
Lead Channel Sensing Intrinsic Amplitude: 4 mV
Lead Channel Sensing Intrinsic Amplitude: 7.875 mV
Lead Channel Sensing Intrinsic Amplitude: 7.875 mV
Lead Channel Setting Pacing Amplitude: 1.25 V
Lead Channel Setting Pacing Amplitude: 2.5 V
Lead Channel Setting Pacing Pulse Width: 0.8 ms
Lead Channel Setting Sensing Sensitivity: 2 mV
MDC IDC LEAD IMPLANT DT: 20180105
MDC IDC LEAD IMPLANT DT: 20180105
MDC IDC LEAD LOCATION: 753860
MDC IDC MSMT BATTERY REMAINING LONGEVITY: 118 mo
MDC IDC MSMT LEADCHNL LV IMPEDANCE VALUE: 418 Ohm
MDC IDC MSMT LEADCHNL LV IMPEDANCE VALUE: 437 Ohm
MDC IDC MSMT LEADCHNL LV IMPEDANCE VALUE: 627 Ohm
MDC IDC MSMT LEADCHNL LV IMPEDANCE VALUE: 665 Ohm
MDC IDC MSMT LEADCHNL LV PACING THRESHOLD AMPLITUDE: 0.75 V
MDC IDC MSMT LEADCHNL RA IMPEDANCE VALUE: 304 Ohm
MDC IDC MSMT LEADCHNL RA PACING THRESHOLD AMPLITUDE: 0.5 V
MDC IDC MSMT LEADCHNL RA SENSING INTR AMPL: 4 mV
MDC IDC MSMT LEADCHNL RV PACING THRESHOLD PULSEWIDTH: 0.4 ms
MDC IDC SESS DTM: 20200114110042
MDC IDC SET LEADCHNL RA PACING AMPLITUDE: 2 V
MDC IDC SET LEADCHNL RV PACING PULSEWIDTH: 0.4 ms
MDC IDC STAT BRADY AP VS PERCENT: 0.01 %
MDC IDC STAT BRADY AS VP PERCENT: 97.93 %
MDC IDC STAT BRADY AS VS PERCENT: 1.78 %
MDC IDC STAT BRADY RV PERCENT PACED: 48.34 %

## 2018-09-15 IMAGING — US US ABDOMEN COMPLETE
1 series · 14 of 25 positions shown · non-contrast
Comparison: CT scan of December 05, 2014.

CLINICAL DATA: Elevated liver function tests.

EXAM:
ABDOMEN ULTRASOUND COMPLETE

[Series 1: us abdomen complete · 0.23mm/px · 14 of 73 slices shown]
[im 1/73]
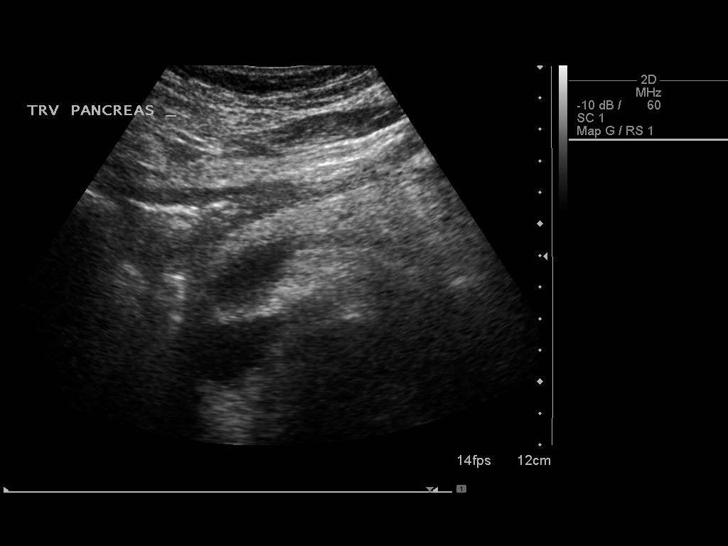
[im 7/73]
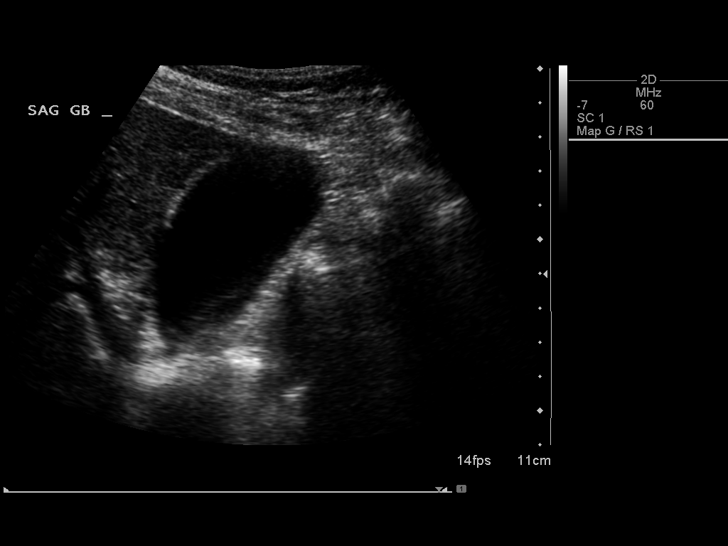
[im 13/73]
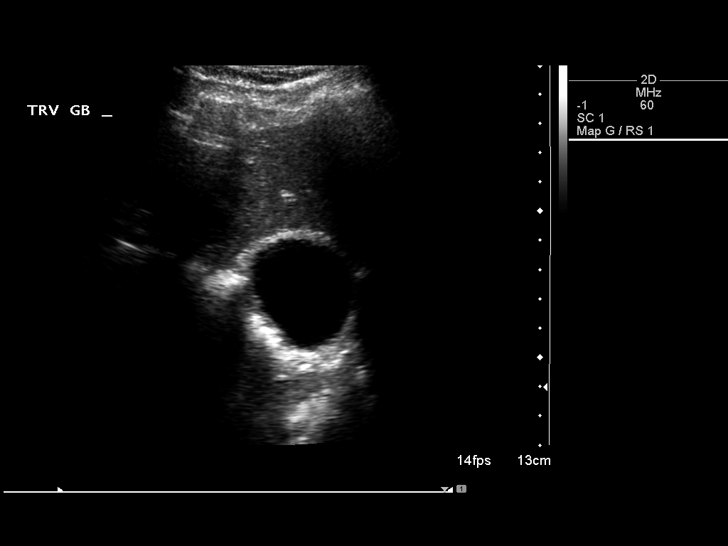
[im 19/73]
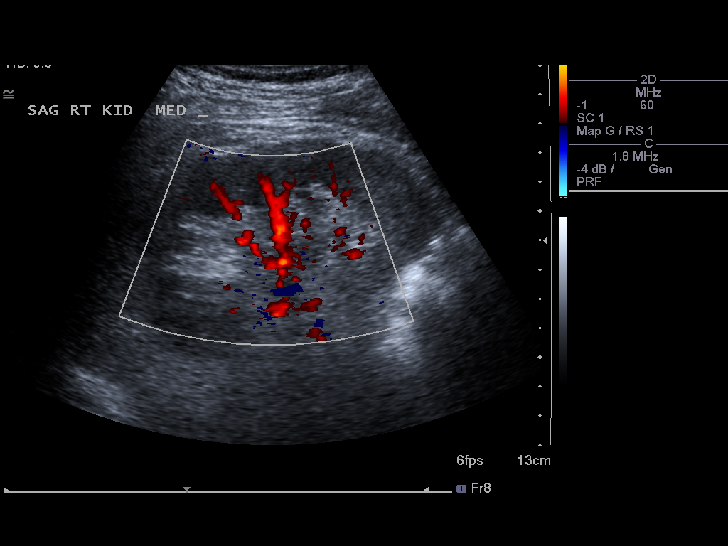
[im 25/73]
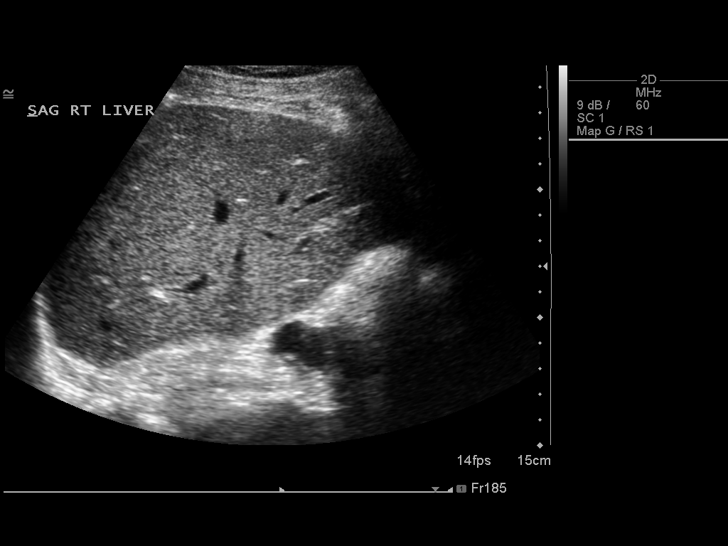
[im 28/73]
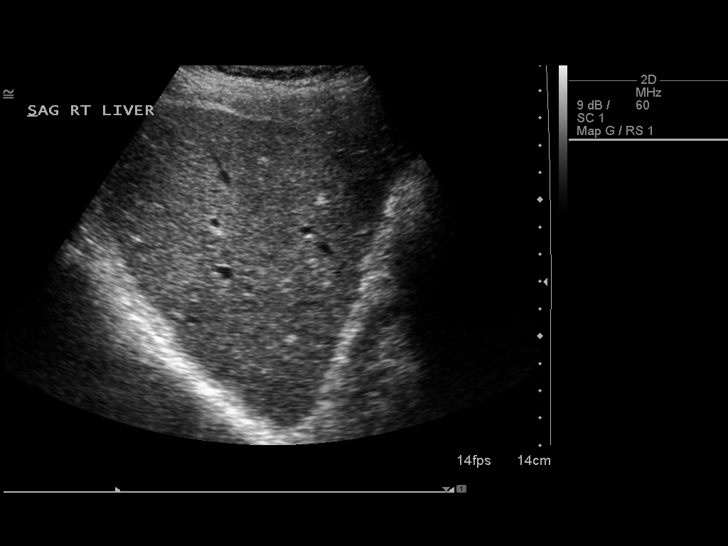
[im 34/73]
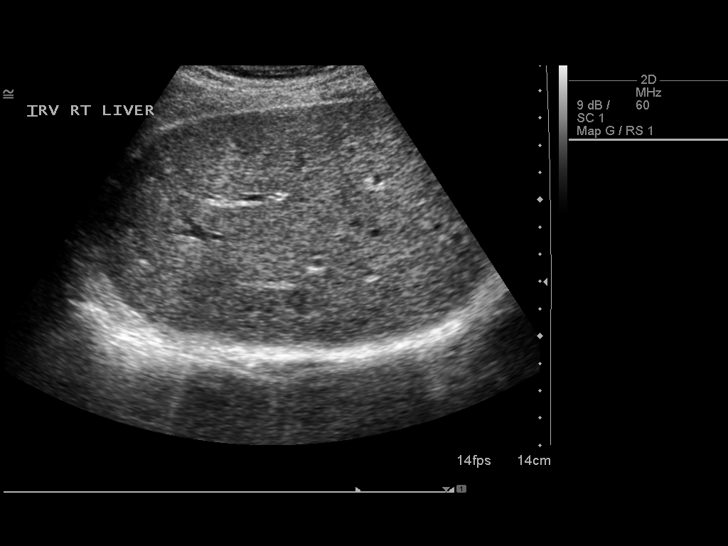
[im 40/73]
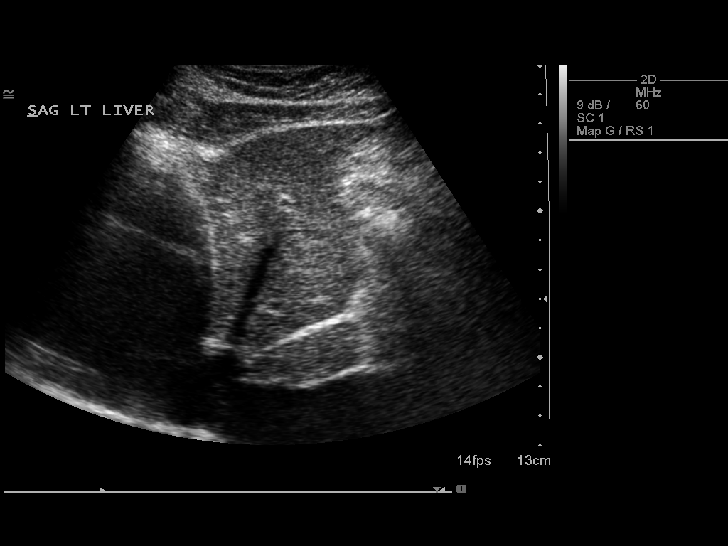
[im 46/73]
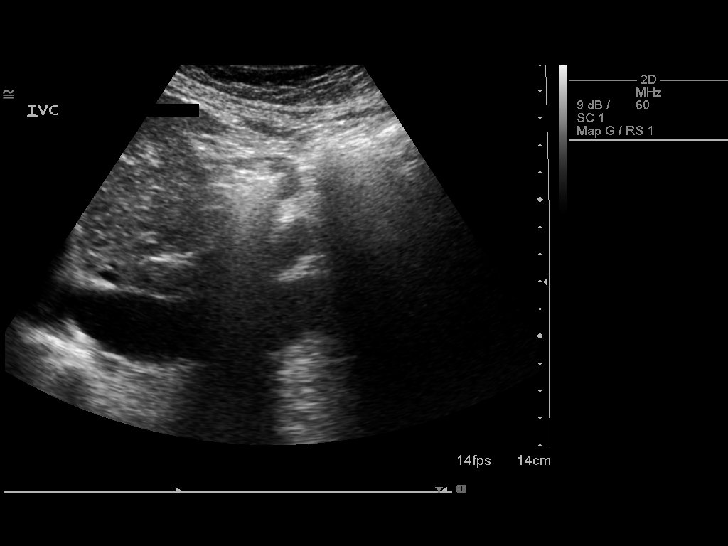
[im 49/73]
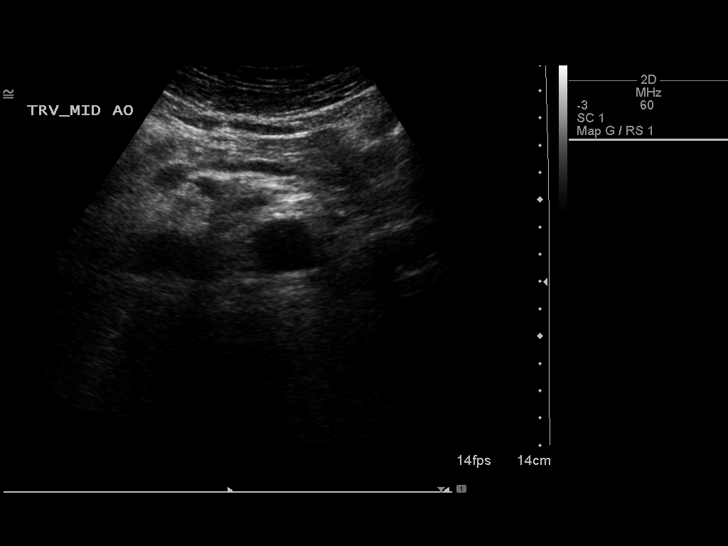
[im 55/73]
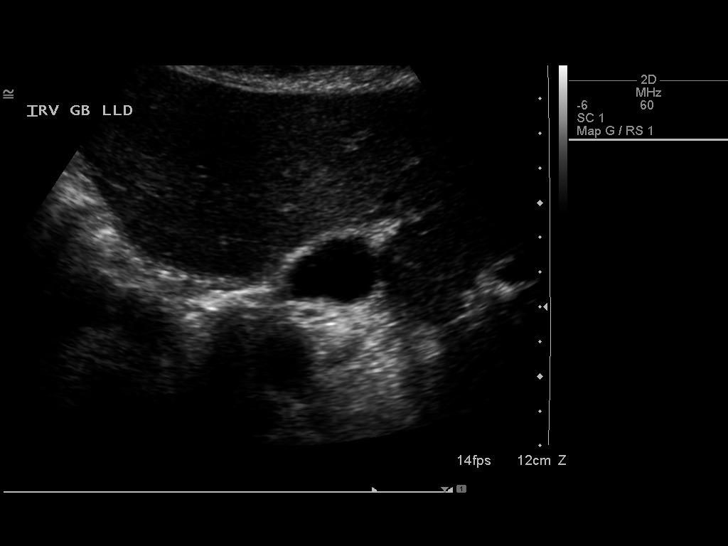
[im 61/73]
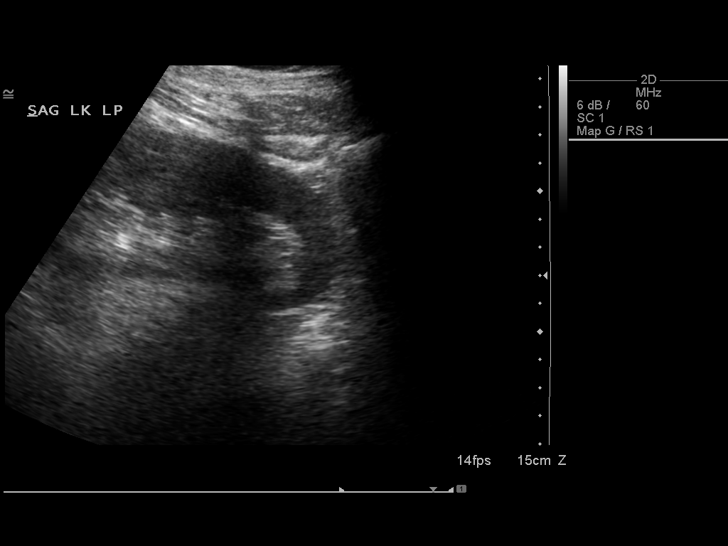
[im 67/73]
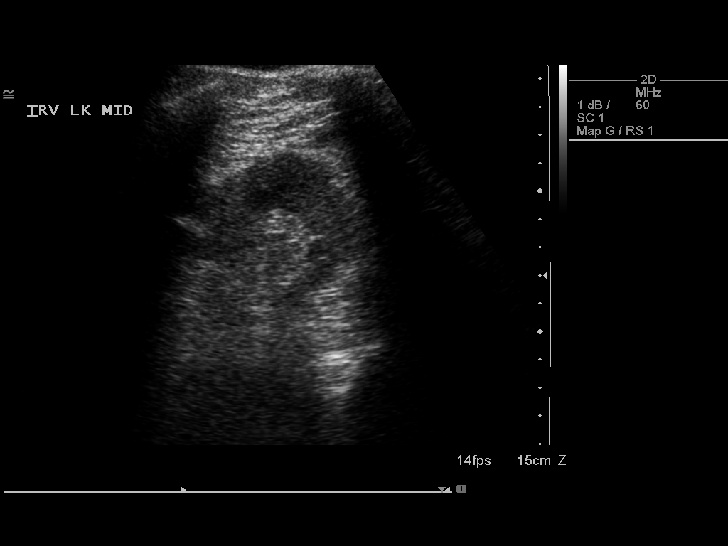
[im 73/73]
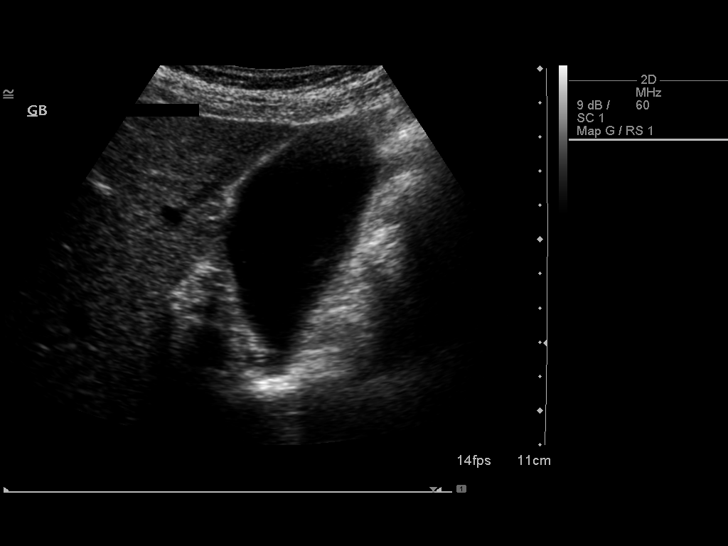

[14 of 25 positions shown; findings below may reference images not displayed]

FINDINGS: Gallbladder: No gallstones or wall thickening visualized. No
sonographic Murphy sign noted by sonographer.

Common bile duct: Diameter: 4.1 mm which is within normal limits.

Liver: No focal lesion identified. Mildly heterogeneous in
appearance.

IVC: No abnormality visualized.

Pancreas: Visualized portion unremarkable. Tail not visualized due
to overlying bowel gas.

Spleen: Size and appearance within normal limits.

Right Kidney: Length: 11.8 cm. Echogenicity within normal limits. No
mass or hydronephrosis visualized.

Left Kidney: Length: 12.7 cm. Echogenicity within normal limits. No
mass or hydronephrosis visualized.

Abdominal aorta: No aneurysm visualized.

Other findings: None.
IMPRESSION: Hepatic parenchyma is mildly heterogeneous in appearance suggesting
possible diffuse hepatocellular disease. No other abnormality seen
in the abdomen.

## 2018-09-22 DIAGNOSIS — C670 Malignant neoplasm of trigone of bladder: Secondary | ICD-10-CM | POA: Diagnosis not present

## 2018-09-22 DIAGNOSIS — R350 Frequency of micturition: Secondary | ICD-10-CM | POA: Diagnosis not present

## 2018-10-06 ENCOUNTER — Ambulatory Visit: Payer: PPO | Admitting: Internal Medicine

## 2018-10-06 ENCOUNTER — Encounter: Payer: Self-pay | Admitting: Internal Medicine

## 2018-10-06 VITALS — BP 136/90 | HR 79 | Ht 72.0 in | Wt 197.0 lb

## 2018-10-06 DIAGNOSIS — I10 Essential (primary) hypertension: Secondary | ICD-10-CM

## 2018-10-06 DIAGNOSIS — I441 Atrioventricular block, second degree: Secondary | ICD-10-CM | POA: Diagnosis not present

## 2018-10-06 DIAGNOSIS — Z950 Presence of cardiac pacemaker: Secondary | ICD-10-CM | POA: Diagnosis not present

## 2018-10-06 DIAGNOSIS — I5022 Chronic systolic (congestive) heart failure: Secondary | ICD-10-CM | POA: Diagnosis not present

## 2018-10-06 LAB — CUP PACEART INCLINIC DEVICE CHECK
Date Time Interrogation Session: 20200303162000
Implantable Lead Implant Date: 20180105
Implantable Lead Location: 753858
Implantable Lead Location: 753859
Implantable Lead Location: 753860
Implantable Lead Model: 4398
Implantable Lead Model: 5076
Implantable Lead Model: 5076
MDC IDC LEAD IMPLANT DT: 20180105
MDC IDC LEAD IMPLANT DT: 20180105
MDC IDC PG IMPLANT DT: 20180105

## 2018-10-06 NOTE — Progress Notes (Signed)
HPI [name] presents today for followup. He is a pleasant 63 yo man with high grade heart block, s/p remote [place], who also has LV dysfunction. He underwent BiV PPM insertion over 2 years ago. In the interim he has done well with no other chest pain or sob. No syncope. No other complaints.He is exercising many hours a day. No Known Allergies   Current Outpatient Medications  Medication Sig Dispense Refill  . aspirin EC 81 MG tablet Take 1 tablet (81 mg total) by mouth daily. Resume 48 hours after surgery or when no bleeding in the urine or from the urethra.    . carvedilol (COREG) 3.125 MG tablet TAKE ONE TABLET BY MOUTH TWICE A DAY (Patient taking differently: Take 3.125 mg by mouth 2 (two) times daily with a meal. ) 180 tablet 3  . fluticasone (FLONASE) 50 MCG/ACT nasal spray Place 1 spray into both nostrils daily as needed for allergies or rhinitis.    [name] HYDROcodone-acetaminophen (NORCO/VICODIN) 5-325 MG tablet Take 1-2 tablets by mouth every 8 (eight) hours as needed for severe pain. (Patient taking differently: Take 1-2 tablets by mouth as needed for severe pain. ) 10 tablet 0  . Melatonin 10 MG CAPS Take 10 mg by mouth at bedtime as needed (for sleep).     . meloxicam (MOBIC) 15 MG tablet Take 15 mg by mouth daily.     . Multiple Vitamin (MULTIVITAMIN) tablet Take 1 tablet by mouth 2 (two) times daily.     . Omega-3 Fatty Acids (FISH OIL ULTRA) 1400 MG CAPS Take 1,400 mg by mouth 2 (two) times daily.     . phenazopyridine (PYRIDIUM) 200 MG tablet Take 1 tablet (200 mg total) by mouth 3 (three) times daily as needed for pain. 10 tablet 0  . tamsulosin (FLOMAX) 0.4 MG CAPS capsule Take 0.4 mg by mouth 2 (two) times daily.    [name] zolpidem (AMBIEN) 10 MG tablet Take 5 mg by mouth See admin instructions. Take 5 mg by mouth at bedtime. May take an additional 5 mg by mouth as needed for sleep if the first dose doesn't fully work     No current facility-administered medications for  this visit.      Past Medical History:  Diagnosis Date  . Abnormal LFTs    a. eval in 2017 for this - abd [place] with diffuse hepatocellular disease.  . Aortic insufficiency    a. bicuspid AV/AI s/p bioprosthetic AVR and aortic root replacement 05/2009.  [name] Bicuspid aortic valve   . Bladder cancer [facility])    recurrent bladder tumor /6 surgeries/ no chemo or radiation  . Dilated aortic root ([place])   . Essential hypertension    pt. denies  . Former consumption of alcohol   . Frequency of urination   . Headache    hx of cluster headaches  . Heart murmur   . Hepatocellular dysfunction   . History of aortic insufficiency   . NICM (nonischemic cardiomyopathy) ([place])    a. Previous EF 45-50% in 2009 - echo in 2014 reported EF 50-55% but upon review felt to be lower - f/u echo 07/2016 with EF 40-45%. (no CAD by cath in 2010).  . Nocturia   . Presence of permanent cardiac pacemaker    Left chest  . RBBB    hx of  . S/P aortic valve replacement with bioprosthetic valve 05-10-2009   BY DR Ricard Dillon   CARDIOLOGIST-  DR Angelena Form  ROS:   All systems reviewed and negative except as noted in the HPI.   Past Surgical History:  Procedure Laterality Date  . APPENDECTOMY  AGE 29  . BIOLOGICAL BENTALL AORTIC ROOT REPLACEMENT W/ PERICARDIAL TISSUE VALVE AND SYNTHETIC AORTIC GRAFT  05-10-2009  DR OWENS   BICUSPID AV W/ SEVERE AI &  ANEURYSM OF AORTIC ROOT AND PROXIMAL ASCENDING THORACIC AORTA  . CARDIAC CATHETERIZATION  08-10-2008  DR MCALHANY   NO EVIDENCE CAD/ MILD GLOBAL LVSF/ MODERATE AORTIC INSUFFICIENCY/ MILD DILATION OF THE AORTIC ROOT/ EF 45-50%  . CARDIAC CATHETERIZATION N/A 08/09/2016   Procedure: Left Heart Cath and Coronary Angiography;  Surgeon: Jettie Booze, MD;  Location: [place] [facility];  Service: Cardiovascular;  Laterality: N/A;  . CATARACT EXTRACTION W/ INTRAOCULAR LENS IMPLANT Right 2015  . CYSTOSCOPY W/ RETROGRADES Bilateral 12/31/2012   Procedure: CYSTOSCOPY WITH  RETROGRADE PYELOGRAM With CYSTOGRAM AND DEEP BLADDER BIOPSY ;  Surgeon: Molli Hazard, MD;  Location[facility];  Service: Urology;  Laterality: Bilateral;  . CYSTOSCOPY W/ RETROGRADES Bilateral 10/12/2015   Procedure: BILATERAL RETROGRADE PYELOGRAM AND POST OP MITOMYCIN C;  Surgeon: Ardis Hughs, MD;  Location: [facility];  Service: Urology;  Laterality: Bilateral;  . CYSTOSCOPY W/ RETROGRADES Bilateral 10/25/2016   Procedure: CYSTOSCOPY WITH RETROGRADE PYELOGRAM;  Surgeon: Ardis Hughs, MD;  Location: [facility];  Service: Urology;  Laterality: Bilateral;  . CYSTOSCOPY WITH BIOPSY N/A 02/22/2013   Procedure: CYSTOSCOPY WITH BIOPSY;  Surgeon: Molli Hazard, MD;  Location: [facility];  Service: Urology;  Laterality: N/A;  . CYSTOSCOPY WITH BIOPSY N/A 08/02/2013   Procedure: CYSTOSCOPY WITH BLADDER BIOPSY AND  BILATERAL RETROGRADES;  Surgeon: Molli Hazard, MD;  Location: [facility];  Service: Urology;  Laterality: N/A;  . EP IMPLANTABLE DEVICE N/A 08/09/2016   Procedure: BiV Pacemaker Insertion CRT-P;  Surgeon: Evans Lance, MD;  Location: [place] [facility];  Service: Cardiovascular;  Laterality: N/A;  . HYDROCELE EXCISION / REPAIR  2009  . TRANSTHORACIC ECHOCARDIOGRAM  07-23-2013  DR MCALHANY   mild LVH,  ef 50-55%/   NORMAL FUNCTIONING BIOPROSTHETIC AORTIC VALVE (mean granient 65mmHg, peak gradient 27mmHg)/ 66mm prosthetic aortic root/  trivial PR/  mild RAE/  mild dilated RV  . TRANSURETHRAL RESECTION OF BLADDER TUMOR N/A 12/31/2012   Procedure: TRANSURETHRAL RESECTION OF BLADDER TUMOR (TURBT);  Surgeon: Molli Hazard, MD;  Location: [facility];  Service: Urology;  Laterality: N/A;  . TRANSURETHRAL RESECTION OF BLADDER TUMOR N/A 10/12/2015   Procedure: TRANSURETHRAL RESECTION OF BLADDER TUMOR (TURBT);  Surgeon: Ardis Hughs, MD;  Location: [facility];  Service:  Urology;  Laterality: N/A;  . TRANSURETHRAL RESECTION OF BLADDER TUMOR Bilateral 05/21/2018   Procedure: CYSTOSCOPY, TRANSURETHRAL RESECTION OF BLADDER TUMOR (TURBT), BILATERAL RETROGRADE PYELOGRAMS;  Surgeon: Ardis Hughs, MD;  [facility];  Service: Urology;  Laterality: Bilateral;  . TRANSURETHRAL RESECTION OF BLADDER TUMOR WITH MITOMYCIN-C N/A 10/25/2016   Procedure: TRANSURETHRAL RESECTION OF BLADDER TUMOR WITH MITOMYCIN-C;  Surgeon: Ardis Hughs, MD;  Location[facility];  Service: Urology;  Laterality: N/A;  . TRANSURETHRAL RESECTION OF PROSTATE N/A 05/21/2018   Procedure: TRANSURETHRAL RESECTION OF THE PROSTATE (TURP);  Surgeon: Ardis Hughs, MD;  Location[facility];  Service: Urology;  Laterality: N/A;     Family History  Problem Relation Age of Onset  . Mitral valve prolapse Father   . Brain cancer Father   .  Throat cancer Father      Social History   Socioeconomic History  . Marital status: Single    Spouse name: Not on file  . Number of children: Not on file  . Years of education: Not on file  . Highest education level: Not on file  Occupational History  . Not on file  Social Needs  . Financial resource strain: Not on file  . Food insecurity:    Worry: Not on file    Inability: Not on file  . Transportation needs:    Medical: Not on file    Non-medical: Not on file  Tobacco Use  . Smoking status: Former Smoker    Packs/day: 2.00    Years: 25.00    Pack years: 50.00    Types: Cigarettes    Last attempt to quit: 12/30/2007    Years since quitting: 10.7  . Smokeless tobacco: Never Used  Substance and Sexual Activity  . Alcohol use: No  . Drug use: No  . Sexual activity: Yes  Lifestyle  . Physical activity:    Days per week: Not on file    Minutes per session: Not on file  . Stress: Not on file  Relationships  . Social connections:    Talks on phone: Not on file    Gets together: Not on file    Attends religious service: Not on file      Active member of club or organization: Not on file    Attends meetings of clubs or organizations: Not on file    Relationship status: Not on file  . Intimate partner violence:    Fear of current or ex partner: Not on file    Emotionally abused: Not on file    Physically abused: Not on file    Forced sexual activity: Not on file  Other Topics Concern  . Not on file  Social History Narrative   Tobacco use 1 1/2 ppd for 35 years- stopped 2010   No alcohol    No illicit drugs   Single    2 children     BP 136/90   Pulse 79   Ht 6' (1.829 m)   Wt 197 lb (89.4 kg)   SpO2 99%   BMI 26.72 kg/m   Physical Exam:  Well appearing NAD HEENT: Unremarkable Neck:  No JVD, no thyromegally Lymphatics:  No adenopathy Back:  No CVA tenderness Lungs:  Clear HEART:  Regular rate rhythm, no murmurs, no rubs, no clicks Abd:  soft, positive bowel sounds, no organomegally, no rebound, no guarding Ext:  2 plus pulses, no edema, no cyanosis, no clubbing Skin:  No rashes no nodules Neuro:  CN II through XII intact, motor grossly intact  EKG - nsr with biv pacing  DEVICE  Normal device function.  See PaceArt for details.   Assess/Plan: 1. CHB - he is asymptomatic, s/p PPM  2. Chronic systolic heart failure - with biv pacing and medical therapy, he is asymptomatic.  3. HTN - his blood pressure is up a bit today but has been better.  4. PPM - his medtronic biv PPM demonstrates normal device function.   Mikle Bosworth.D.

## 2018-10-06 NOTE — Patient Instructions (Signed)
Medication Instructions:  Your physician recommends that you continue on your current medications as directed. Please refer to the Current Medication list given to you today.  Labwork: None ordered.  Testing/Procedures: None ordered.  Follow-Up: Your physician wants you to follow-up in: one year with Dr. Lovena Le.   You will receive a reminder letter in the mail two months in advance. If you don't receive a letter, please call our office to schedule the follow-up appointment.  Remote monitoring is used to monitor your Pacemaker from home. This monitoring reduces the number of office visits required to check your device to one time per year. It allows [place] to keep an eye on the functioning of your device to ensure it is working properly. You are scheduled for a device check from home on 11/17/2018. You may send your transmission at any time that day. If you have a wireless device, the transmission will be sent automatically. After your physician reviews your transmission, you will receive a postcard with your next transmission date.  Any Other Special Instructions Will Be Listed Below (If Applicable).  If you need a refill on your cardiac medications before your next appointment, please call your pharmacy.

## 2018-10-07 DIAGNOSIS — G8929 Other chronic pain: Secondary | ICD-10-CM | POA: Diagnosis not present

## 2018-10-07 DIAGNOSIS — M545 Low back pain: Secondary | ICD-10-CM | POA: Diagnosis not present

## 2018-10-07 DIAGNOSIS — I10 Essential (primary) hypertension: Secondary | ICD-10-CM | POA: Diagnosis not present

## 2018-11-17 ENCOUNTER — Other Ambulatory Visit: Payer: Self-pay

## 2018-11-17 ENCOUNTER — Ambulatory Visit (INDEPENDENT_AMBULATORY_CARE_PROVIDER_SITE_OTHER): Payer: PPO | Admitting: *Deleted

## 2018-11-17 DIAGNOSIS — I441 Atrioventricular block, second degree: Secondary | ICD-10-CM

## 2018-11-17 LAB — CUP PACEART REMOTE DEVICE CHECK
Battery Remaining Longevity: 107 mo
Battery Voltage: 3 V
Brady Statistic AP VP Percent: 0.09 %
Brady Statistic AP VS Percent: 0.01 %
Brady Statistic AS VP Percent: 98.26 %
Brady Statistic AS VS Percent: 1.63 %
Brady Statistic RA Percent Paced: 0.1 %
Brady Statistic RV Percent Paced: 28.07 %
Date Time Interrogation Session: 20200414114056
Implantable Lead Implant Date: 20180105
Implantable Lead Implant Date: 20180105
Implantable Lead Implant Date: 20180105
Implantable Lead Location: 753858
Implantable Lead Location: 753859
Implantable Lead Location: 753860
Implantable Lead Model: 4398
Implantable Lead Model: 5076
Implantable Lead Model: 5076
Implantable Pulse Generator Implant Date: 20180105
Lead Channel Impedance Value: 304 Ohm
Lead Channel Impedance Value: 304 Ohm
Lead Channel Impedance Value: 399 Ohm
Lead Channel Impedance Value: 399 Ohm
Lead Channel Impedance Value: 399 Ohm
Lead Channel Impedance Value: 437 Ohm
Lead Channel Impedance Value: 437 Ohm
Lead Channel Impedance Value: 475 Ohm
Lead Channel Impedance Value: 513 Ohm
Lead Channel Impedance Value: 589 Ohm
Lead Channel Impedance Value: 608 Ohm
Lead Channel Impedance Value: 646 Ohm
Lead Channel Impedance Value: 722 Ohm
Lead Channel Impedance Value: 741 Ohm
Lead Channel Pacing Threshold Amplitude: 0.5 V
Lead Channel Pacing Threshold Amplitude: 0.625 V
Lead Channel Pacing Threshold Amplitude: 0.875 V
Lead Channel Pacing Threshold Pulse Width: 0.4 ms
Lead Channel Pacing Threshold Pulse Width: 0.4 ms
Lead Channel Pacing Threshold Pulse Width: 0.8 ms
Lead Channel Sensing Intrinsic Amplitude: 4 mV
Lead Channel Sensing Intrinsic Amplitude: 4 mV
Lead Channel Sensing Intrinsic Amplitude: 6.5 mV
Lead Channel Sensing Intrinsic Amplitude: 6.5 mV
Lead Channel Setting Pacing Amplitude: 1.5 V
Lead Channel Setting Pacing Amplitude: 2 V
Lead Channel Setting Pacing Amplitude: 2.5 V
Lead Channel Setting Pacing Pulse Width: 0.4 ms
Lead Channel Setting Pacing Pulse Width: 0.8 ms
Lead Channel Setting Sensing Sensitivity: 2 mV

## 2018-11-24 ENCOUNTER — Encounter: Payer: Self-pay | Admitting: Cardiology

## 2018-11-24 NOTE — Progress Notes (Signed)
Remote pacemaker transmission.   

## 2018-11-27 IMAGING — CR DG CHEST 2V
2 series · 2 of 2 positions shown · non-contrast
Comparison: 07/30/2016

CLINICAL DATA: Status post pacemaker insertion

EXAM:
CHEST  2 VIEW

[chest pa]
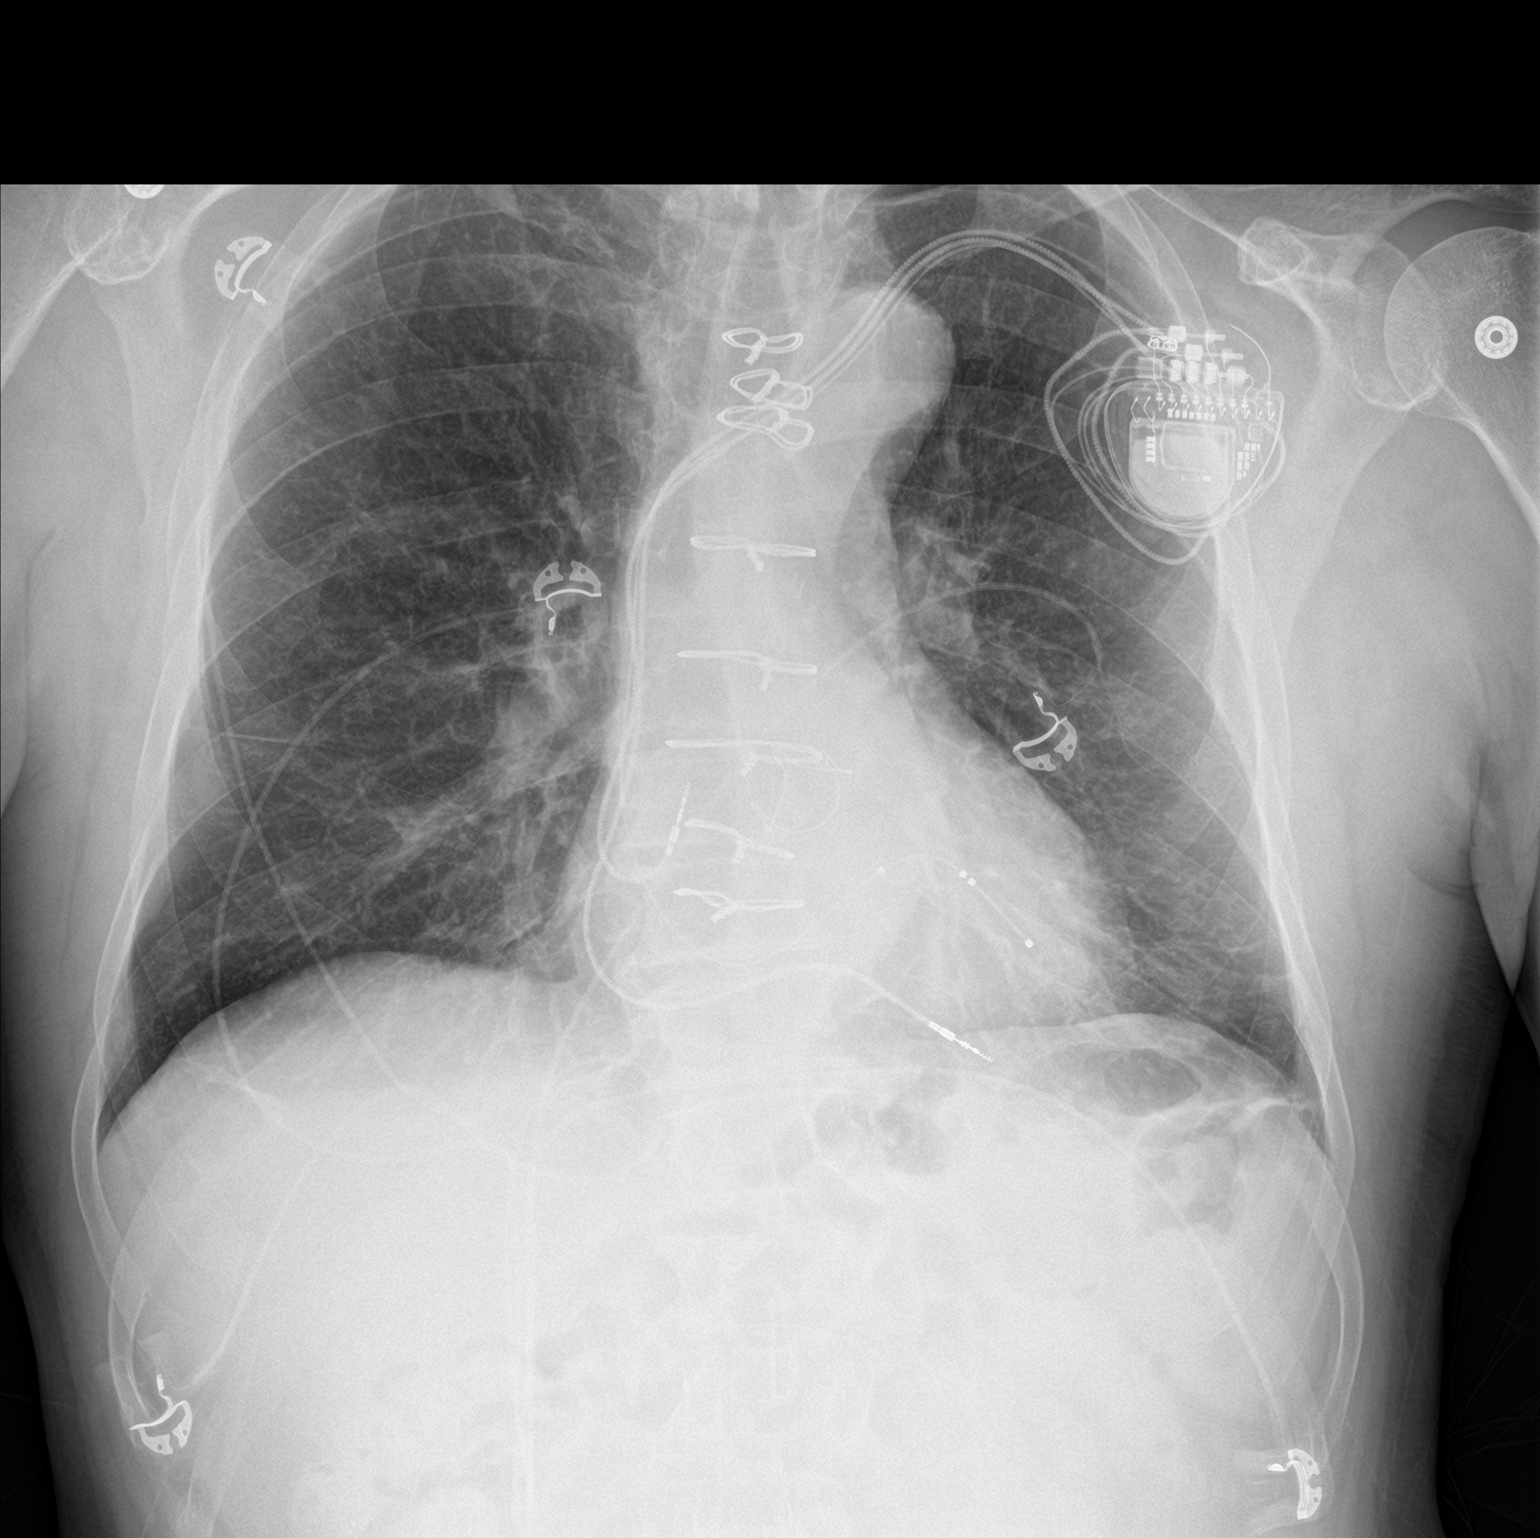

[chest lat]
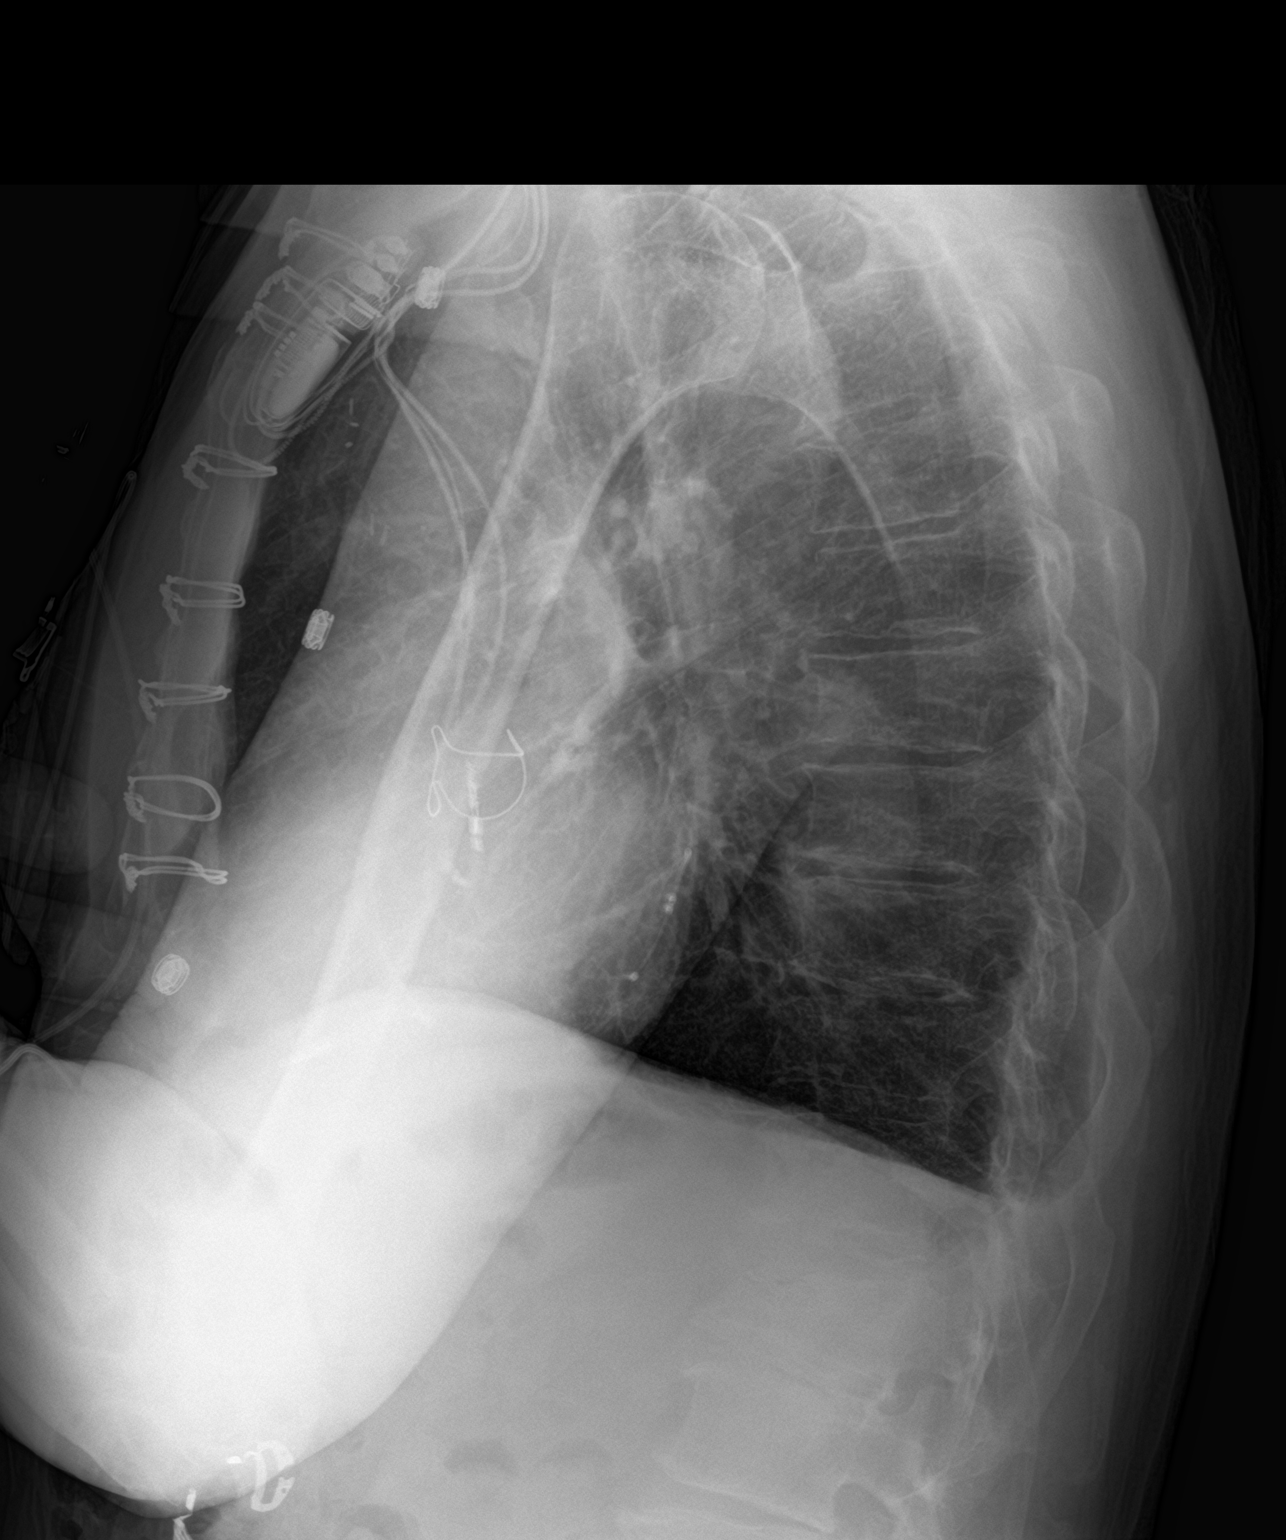

[2 of 2 positions shown; findings below may reference images not displayed]

FINDINGS: Stable normal heart size after biventricular pacer insert.
Right-sided leads project over the right atrial appendage and apical
right ventricle. No pneumothorax or pleural effusion. Mild basilar
atelectasis. Stable aortic tortuosity. Status post aortic valve
replacement.
IMPRESSION: No unexpected finding after biventricular pacer implant.

## 2018-12-11 DIAGNOSIS — N410 Acute prostatitis: Secondary | ICD-10-CM | POA: Diagnosis not present

## 2018-12-11 DIAGNOSIS — C670 Malignant neoplasm of trigone of bladder: Secondary | ICD-10-CM | POA: Diagnosis not present

## 2018-12-17 DIAGNOSIS — C670 Malignant neoplasm of trigone of bladder: Secondary | ICD-10-CM | POA: Diagnosis not present

## 2018-12-17 DIAGNOSIS — N410 Acute prostatitis: Secondary | ICD-10-CM | POA: Diagnosis not present

## 2018-12-17 DIAGNOSIS — R300 Dysuria: Secondary | ICD-10-CM | POA: Diagnosis not present

## 2018-12-18 ENCOUNTER — Telehealth: Payer: Self-pay | Admitting: Cardiovascular Disease

## 2018-12-18 NOTE — Telephone Encounter (Signed)
Noted and appreciated.   Lauree Chandler

## 2018-12-18 NOTE — Telephone Encounter (Signed)
Pt had previously spoken with our scheduling team regarding his upcoming appointment with Dr. Angelena Form on May 29,2020.  He was requesting to be seen in office.  It was explained to pt that Dr. Angelena Form would not be in office that day but was doing virtual visits.  Pt was offered in office visit with B. Rosita Fire, [place] on 5/21 but pt denied this visit.   I spoke with pt. He reports BP was 150/104 when recently seen by his urologist. He reports feeling lightheaded upon standing at times. I again offered appointment on 5/21 but pt does not want to be seen that day.  He expressed displeasure regarding scheduling of appointments in our office. He asked me to cancel all appointments with our office. He reports he has transferred his care to [facility] in [place].  He has already contacted medical records to request his records be transferred. I asked pt if there was anything else I could do to assist him and his only request is to make sure his records are sent to [facility].

## 2018-12-18 NOTE — Telephone Encounter (Signed)
  Pt c/o BP issue: STAT if pt c/o blurred vision, one-sided weakness or slurred speech  1. What are your last 5 BP readings? 150/104 on 12/17/18  2. Are you having any other symptoms (ex. Dizziness, headache, blurred vision, passed out)? Feels like he may pass out when he's exercising, headaches, no chest pain noted  3. What is your BP issue? Patient is upset that he cannot be seen in the office and his blood pressure is running high. He says his appt with Angelena Form keeps getting canceled and he wants to be seen in the office.

## 2018-12-18 NOTE — Telephone Encounter (Signed)
Pt has upcoming appt with Dr. Angelena Form 01/01/19

## 2018-12-30 DIAGNOSIS — I428 Other cardiomyopathies: Secondary | ICD-10-CM | POA: Diagnosis not present

## 2018-12-30 DIAGNOSIS — Z950 Presence of cardiac pacemaker: Secondary | ICD-10-CM | POA: Diagnosis not present

## 2018-12-30 DIAGNOSIS — I2510 Atherosclerotic heart disease of native coronary artery without angina pectoris: Secondary | ICD-10-CM | POA: Diagnosis not present

## 2018-12-30 DIAGNOSIS — I4510 Unspecified right bundle-branch block: Secondary | ICD-10-CM | POA: Diagnosis not present

## 2018-12-30 DIAGNOSIS — Z952 Presence of prosthetic heart valve: Secondary | ICD-10-CM | POA: Diagnosis not present

## 2018-12-30 DIAGNOSIS — I4439 Other atrioventricular block: Secondary | ICD-10-CM | POA: Diagnosis not present

## 2018-12-30 DIAGNOSIS — I10 Essential (primary) hypertension: Secondary | ICD-10-CM | POA: Diagnosis not present

## 2018-12-30 DIAGNOSIS — I498 Other specified cardiac arrhythmias: Secondary | ICD-10-CM | POA: Diagnosis not present

## 2019-01-01 ENCOUNTER — Ambulatory Visit: Payer: BLUE CROSS/BLUE SHIELD | Admitting: Cardiovascular Disease

## 2019-01-06 DIAGNOSIS — Z953 Presence of xenogenic heart valve: Secondary | ICD-10-CM | POA: Diagnosis not present

## 2019-01-06 DIAGNOSIS — I428 Other cardiomyopathies: Secondary | ICD-10-CM | POA: Diagnosis not present

## 2019-01-08 DIAGNOSIS — F5101 Primary insomnia: Secondary | ICD-10-CM | POA: Diagnosis not present

## 2019-01-08 DIAGNOSIS — G8929 Other chronic pain: Secondary | ICD-10-CM | POA: Diagnosis not present

## 2019-01-08 DIAGNOSIS — I10 Essential (primary) hypertension: Secondary | ICD-10-CM | POA: Diagnosis not present

## 2019-01-08 DIAGNOSIS — M545 Low back pain: Secondary | ICD-10-CM | POA: Diagnosis not present

## 2019-02-16 ENCOUNTER — Ambulatory Visit (INDEPENDENT_AMBULATORY_CARE_PROVIDER_SITE_OTHER): Payer: PPO | Admitting: *Deleted

## 2019-02-16 DIAGNOSIS — C44212 Basal cell carcinoma of skin of right ear and external auricular canal: Secondary | ICD-10-CM | POA: Diagnosis not present

## 2019-02-16 DIAGNOSIS — Z1283 Encounter for screening for malignant neoplasm of skin: Secondary | ICD-10-CM | POA: Diagnosis not present

## 2019-02-16 DIAGNOSIS — I441 Atrioventricular block, second degree: Secondary | ICD-10-CM | POA: Diagnosis not present

## 2019-02-16 DIAGNOSIS — I428 Other cardiomyopathies: Secondary | ICD-10-CM

## 2019-02-16 DIAGNOSIS — B351 Tinea unguium: Secondary | ICD-10-CM | POA: Diagnosis not present

## 2019-02-16 DIAGNOSIS — L821 Other seborrheic keratosis: Secondary | ICD-10-CM | POA: Diagnosis not present

## 2019-02-16 DIAGNOSIS — L918 Other hypertrophic disorders of the skin: Secondary | ICD-10-CM | POA: Diagnosis not present

## 2019-02-17 LAB — CUP PACEART REMOTE DEVICE CHECK
Battery Remaining Longevity: 114 mo
Battery Voltage: 2.99 V
Brady Statistic AP VP Percent: 0.08 %
Brady Statistic AP VS Percent: 0.01 %
Brady Statistic AS VP Percent: 97.87 %
Brady Statistic AS VS Percent: 2.03 %
Brady Statistic RA Percent Paced: 0.09 %
Brady Statistic RV Percent Paced: 46.77 %
Date Time Interrogation Session: 20200715065402
Implantable Lead Implant Date: 20180105
Implantable Lead Implant Date: 20180105
Implantable Lead Implant Date: 20180105
Implantable Lead Location: 753858
Implantable Lead Location: 753859
Implantable Lead Location: 753860
Implantable Lead Model: 4398
Implantable Lead Model: 5076
Implantable Lead Model: 5076
Implantable Pulse Generator Implant Date: 20180105
Lead Channel Impedance Value: 304 Ohm
Lead Channel Impedance Value: 342 Ohm
Lead Channel Impedance Value: 418 Ohm
Lead Channel Impedance Value: 418 Ohm
Lead Channel Impedance Value: 418 Ohm
Lead Channel Impedance Value: 475 Ohm
Lead Channel Impedance Value: 494 Ohm
Lead Channel Impedance Value: 494 Ohm
Lead Channel Impedance Value: 551 Ohm
Lead Channel Impedance Value: 646 Ohm
Lead Channel Impedance Value: 646 Ohm
Lead Channel Impedance Value: 722 Ohm
Lead Channel Impedance Value: 798 Ohm
Lead Channel Impedance Value: 798 Ohm
Lead Channel Pacing Threshold Amplitude: 0.5 V
Lead Channel Pacing Threshold Amplitude: 0.5 V
Lead Channel Pacing Threshold Amplitude: 0.75 V
Lead Channel Pacing Threshold Pulse Width: 0.4 ms
Lead Channel Pacing Threshold Pulse Width: 0.4 ms
Lead Channel Pacing Threshold Pulse Width: 0.8 ms
Lead Channel Sensing Intrinsic Amplitude: 3.625 mV
Lead Channel Sensing Intrinsic Amplitude: 3.625 mV
Lead Channel Sensing Intrinsic Amplitude: 7.125 mV
Lead Channel Sensing Intrinsic Amplitude: 7.125 mV
Lead Channel Setting Pacing Amplitude: 1.25 V
Lead Channel Setting Pacing Amplitude: 2 V
Lead Channel Setting Pacing Amplitude: 2.5 V
Lead Channel Setting Pacing Pulse Width: 0.4 ms
Lead Channel Setting Pacing Pulse Width: 0.8 ms
Lead Channel Setting Sensing Sensitivity: 2 mV

## 2019-03-02 NOTE — Progress Notes (Signed)
Remote pacemaker transmission.   

## 2019-03-16 DIAGNOSIS — C670 Malignant neoplasm of trigone of bladder: Secondary | ICD-10-CM | POA: Diagnosis not present

## 2019-03-16 DIAGNOSIS — R3915 Urgency of urination: Secondary | ICD-10-CM | POA: Diagnosis not present

## 2019-03-30 DIAGNOSIS — C44212 Basal cell carcinoma of skin of right ear and external auricular canal: Secondary | ICD-10-CM | POA: Diagnosis not present

## 2019-04-13 DIAGNOSIS — M545 Low back pain: Secondary | ICD-10-CM | POA: Diagnosis not present

## 2019-04-13 DIAGNOSIS — Z87891 Personal history of nicotine dependence: Secondary | ICD-10-CM | POA: Diagnosis not present

## 2019-04-13 DIAGNOSIS — I10 Essential (primary) hypertension: Secondary | ICD-10-CM | POA: Diagnosis not present

## 2019-04-13 DIAGNOSIS — F5101 Primary insomnia: Secondary | ICD-10-CM | POA: Diagnosis not present

## 2019-04-13 DIAGNOSIS — G8929 Other chronic pain: Secondary | ICD-10-CM | POA: Diagnosis not present

## 2019-05-11 DIAGNOSIS — Z85828 Personal history of other malignant neoplasm of skin: Secondary | ICD-10-CM | POA: Diagnosis not present

## 2019-05-11 DIAGNOSIS — Z08 Encounter for follow-up examination after completed treatment for malignant neoplasm: Secondary | ICD-10-CM | POA: Diagnosis not present

## 2019-05-17 DIAGNOSIS — R509 Fever, unspecified: Secondary | ICD-10-CM | POA: Diagnosis not present

## 2019-05-17 DIAGNOSIS — Z20828 Contact with and (suspected) exposure to other viral communicable diseases: Secondary | ICD-10-CM | POA: Diagnosis not present

## 2019-05-17 DIAGNOSIS — Z87891 Personal history of nicotine dependence: Secondary | ICD-10-CM | POA: Diagnosis not present

## 2019-05-17 DIAGNOSIS — J029 Acute pharyngitis, unspecified: Secondary | ICD-10-CM | POA: Diagnosis not present

## 2019-05-19 ENCOUNTER — Ambulatory Visit (INDEPENDENT_AMBULATORY_CARE_PROVIDER_SITE_OTHER): Payer: PPO | Admitting: *Deleted

## 2019-05-19 DIAGNOSIS — I441 Atrioventricular block, second degree: Secondary | ICD-10-CM | POA: Diagnosis not present

## 2019-05-19 DIAGNOSIS — I428 Other cardiomyopathies: Secondary | ICD-10-CM

## 2019-05-19 LAB — CUP PACEART REMOTE DEVICE CHECK
Battery Remaining Longevity: 112 mo
Battery Voltage: 2.99 V
Brady Statistic AP VP Percent: 0.1 %
Brady Statistic AP VS Percent: 0.01 %
Brady Statistic AS VP Percent: 97.84 %
Brady Statistic AS VS Percent: 2.04 %
Brady Statistic RA Percent Paced: 0.12 %
Brady Statistic RV Percent Paced: 44.12 %
Date Time Interrogation Session: 20201014093102
Implantable Lead Implant Date: 20180105
Implantable Lead Implant Date: 20180105
Implantable Lead Implant Date: 20180105
Implantable Lead Location: 753858
Implantable Lead Location: 753859
Implantable Lead Location: 753860
Implantable Lead Model: 4398
Implantable Lead Model: 5076
Implantable Lead Model: 5076
Implantable Pulse Generator Implant Date: 20180105
Lead Channel Impedance Value: 304 Ohm
Lead Channel Impedance Value: 323 Ohm
Lead Channel Impedance Value: 399 Ohm
Lead Channel Impedance Value: 418 Ohm
Lead Channel Impedance Value: 418 Ohm
Lead Channel Impedance Value: 456 Ohm
Lead Channel Impedance Value: 494 Ohm
Lead Channel Impedance Value: 513 Ohm
Lead Channel Impedance Value: 570 Ohm
Lead Channel Impedance Value: 608 Ohm
Lead Channel Impedance Value: 627 Ohm
Lead Channel Impedance Value: 703 Ohm
Lead Channel Impedance Value: 760 Ohm
Lead Channel Impedance Value: 779 Ohm
Lead Channel Pacing Threshold Amplitude: 0.5 V
Lead Channel Pacing Threshold Amplitude: 0.625 V
Lead Channel Pacing Threshold Amplitude: 0.75 V
Lead Channel Pacing Threshold Pulse Width: 0.4 ms
Lead Channel Pacing Threshold Pulse Width: 0.4 ms
Lead Channel Pacing Threshold Pulse Width: 0.8 ms
Lead Channel Sensing Intrinsic Amplitude: 3.875 mV
Lead Channel Sensing Intrinsic Amplitude: 3.875 mV
Lead Channel Sensing Intrinsic Amplitude: 6.625 mV
Lead Channel Sensing Intrinsic Amplitude: 6.625 mV
Lead Channel Setting Pacing Amplitude: 1.25 V
Lead Channel Setting Pacing Amplitude: 2 V
Lead Channel Setting Pacing Amplitude: 2.5 V
Lead Channel Setting Pacing Pulse Width: 0.4 ms
Lead Channel Setting Pacing Pulse Width: 0.8 ms
Lead Channel Setting Sensing Sensitivity: 2 mV

## 2019-05-31 NOTE — Progress Notes (Signed)
Remote pacemaker transmission.   

## 2019-07-06 DIAGNOSIS — G8929 Other chronic pain: Secondary | ICD-10-CM | POA: Diagnosis not present

## 2019-07-06 DIAGNOSIS — M545 Low back pain: Secondary | ICD-10-CM | POA: Diagnosis not present

## 2019-08-10 DIAGNOSIS — Z1329 Encounter for screening for other suspected endocrine disorder: Secondary | ICD-10-CM | POA: Diagnosis not present

## 2019-08-10 DIAGNOSIS — Z13220 Encounter for screening for lipoid disorders: Secondary | ICD-10-CM | POA: Diagnosis not present

## 2019-08-10 DIAGNOSIS — R799 Abnormal finding of blood chemistry, unspecified: Secondary | ICD-10-CM | POA: Diagnosis not present

## 2019-08-10 DIAGNOSIS — Z0000 Encounter for general adult medical examination without abnormal findings: Secondary | ICD-10-CM | POA: Diagnosis not present

## 2019-08-10 DIAGNOSIS — F5101 Primary insomnia: Secondary | ICD-10-CM | POA: Diagnosis not present

## 2019-08-10 DIAGNOSIS — G8929 Other chronic pain: Secondary | ICD-10-CM | POA: Diagnosis not present

## 2019-08-10 DIAGNOSIS — M545 Low back pain: Secondary | ICD-10-CM | POA: Diagnosis not present

## 2019-08-10 DIAGNOSIS — R739 Hyperglycemia, unspecified: Secondary | ICD-10-CM | POA: Diagnosis not present

## 2019-08-13 ENCOUNTER — Telehealth: Payer: Self-pay

## 2019-08-13 NOTE — Telephone Encounter (Signed)
LMOVM for pt to stop sending manual transmission with his home remote monitor.

## 2019-08-17 DIAGNOSIS — M48061 Spinal stenosis, lumbar region without neurogenic claudication: Secondary | ICD-10-CM | POA: Diagnosis not present

## 2019-08-17 DIAGNOSIS — M5416 Radiculopathy, lumbar region: Secondary | ICD-10-CM | POA: Diagnosis not present

## 2019-08-18 ENCOUNTER — Other Ambulatory Visit: Payer: Self-pay | Admitting: Neurosurgery

## 2019-08-18 ENCOUNTER — Telehealth: Payer: Self-pay

## 2019-08-18 ENCOUNTER — Ambulatory Visit (INDEPENDENT_AMBULATORY_CARE_PROVIDER_SITE_OTHER): Payer: PPO | Admitting: *Deleted

## 2019-08-18 DIAGNOSIS — I441 Atrioventricular block, second degree: Secondary | ICD-10-CM | POA: Diagnosis not present

## 2019-08-18 DIAGNOSIS — M48061 Spinal stenosis, lumbar region without neurogenic claudication: Secondary | ICD-10-CM

## 2019-08-18 LAB — CUP PACEART REMOTE DEVICE CHECK
Battery Remaining Longevity: 106 mo
Battery Voltage: 2.98 V
Brady Statistic AP VP Percent: 0.03 %
Brady Statistic AP VS Percent: 0.01 %
Brady Statistic AS VP Percent: 98.44 %
Brady Statistic AS VS Percent: 1.51 %
Brady Statistic RA Percent Paced: 0.05 %
Brady Statistic RV Percent Paced: 41.41 %
Date Time Interrogation Session: 20210113052553
Implantable Lead Implant Date: 20180105
Implantable Lead Implant Date: 20180105
Implantable Lead Implant Date: 20180105
Implantable Lead Location: 753858
Implantable Lead Location: 753859
Implantable Lead Location: 753860
Implantable Lead Model: 4398
Implantable Lead Model: 5076
Implantable Lead Model: 5076
Implantable Pulse Generator Implant Date: 20180105
Lead Channel Impedance Value: 304 Ohm
Lead Channel Impedance Value: 304 Ohm
Lead Channel Impedance Value: 380 Ohm
Lead Channel Impedance Value: 399 Ohm
Lead Channel Impedance Value: 399 Ohm
Lead Channel Impedance Value: 437 Ohm
Lead Channel Impedance Value: 456 Ohm
Lead Channel Impedance Value: 456 Ohm
Lead Channel Impedance Value: 513 Ohm
Lead Channel Impedance Value: 589 Ohm
Lead Channel Impedance Value: 608 Ohm
Lead Channel Impedance Value: 665 Ohm
Lead Channel Impedance Value: 722 Ohm
Lead Channel Impedance Value: 722 Ohm
Lead Channel Pacing Threshold Amplitude: 0.5 V
Lead Channel Pacing Threshold Amplitude: 0.625 V
Lead Channel Pacing Threshold Amplitude: 0.75 V
Lead Channel Pacing Threshold Pulse Width: 0.4 ms
Lead Channel Pacing Threshold Pulse Width: 0.4 ms
Lead Channel Pacing Threshold Pulse Width: 0.8 ms
Lead Channel Sensing Intrinsic Amplitude: 3.75 mV
Lead Channel Sensing Intrinsic Amplitude: 3.75 mV
Lead Channel Sensing Intrinsic Amplitude: 8.25 mV
Lead Channel Sensing Intrinsic Amplitude: 8.25 mV
Lead Channel Setting Pacing Amplitude: 1.25 V
Lead Channel Setting Pacing Amplitude: 2 V
Lead Channel Setting Pacing Amplitude: 2.5 V
Lead Channel Setting Pacing Pulse Width: 0.4 ms
Lead Channel Setting Pacing Pulse Width: 0.8 ms
Lead Channel Setting Sensing Sensitivity: 2 mV

## 2019-08-18 NOTE — Telephone Encounter (Signed)
Phone call to patient to verify medication list and allergies for myelogram procedure. Medications pt is currently taking are safe to continue to take. Advised pt if any new medications are started prior to procedure to call and make [place] aware. Pt also instructed to have a driver the day of the procedure, he would be with [place] around 2 hours, and would need to lay flat 24 hours after test. Pt verbalized understanding.

## 2019-08-31 ENCOUNTER — Ambulatory Visit
Admission: RE | Admit: 2019-08-31 | Discharge: 2019-08-31 | Disposition: A | Discharge status: Home / Self Care | Payer: PPO | Source: Ambulatory Visit | Attending: Neurosurgery | Admitting: Neurosurgery

## 2019-08-31 ENCOUNTER — Other Ambulatory Visit: Payer: Self-pay

## 2019-08-31 DIAGNOSIS — M48061 Spinal stenosis, lumbar region without neurogenic claudication: Secondary | ICD-10-CM | POA: Diagnosis not present

## 2019-08-31 MED ORDER — DIAZEPAM 5 MG PO TABS
10.0000 mg | ORAL_TABLET | Freq: Once | ORAL | Status: AC
  Administered 2019-08-31: 5 mg via ORAL

## 2019-08-31 MED ORDER — IOPAMIDOL (ISOVUE-M 200) INJECTION 41%: 15.0000 mL | Freq: Once | INTRAMUSCULAR | Status: DC

## 2019-08-31 NOTE — Discharge Instructions (Signed)

## 2019-09-07 DIAGNOSIS — M48062 Spinal stenosis, lumbar region with neurogenic claudication: Secondary | ICD-10-CM | POA: Diagnosis not present

## 2019-09-07 DIAGNOSIS — M5440 Lumbago with sciatica, unspecified side: Secondary | ICD-10-CM | POA: Diagnosis not present

## 2019-09-08 DIAGNOSIS — H04123 Dry eye syndrome of bilateral lacrimal glands: Secondary | ICD-10-CM | POA: Diagnosis not present

## 2019-09-08 DIAGNOSIS — H35033 Hypertensive retinopathy, bilateral: Secondary | ICD-10-CM | POA: Diagnosis not present

## 2019-09-08 DIAGNOSIS — H43391 Other vitreous opacities, right eye: Secondary | ICD-10-CM | POA: Diagnosis not present

## 2019-09-08 DIAGNOSIS — H35013 Changes in retinal vascular appearance, bilateral: Secondary | ICD-10-CM | POA: Diagnosis not present

## 2019-09-10 ENCOUNTER — Other Ambulatory Visit: Payer: Self-pay | Admitting: Neurosurgery

## 2019-09-17 NOTE — Pre-Procedure Instructions (Signed)
[facility] [address] [place] [address] [place] [address] Phone: [phone] Fax: [phone] [place], [place] - [address] [place] [address] Phone: [phone] Fax: [place], [address] [place] [facility] [address] AT [address] [place] [address] Phone: [phone] Fax: [phone]      Your procedure is scheduled on Wednesday, February 17th.  Report to [facility] Main Entrance "A" at 6:30 A.M., and check in at the Admitting office.  Call this number if you have problems the morning of surgery:  [phone]  Call [phone] if you have any questions prior to your surgery date Monday-Friday 8am-4pm    Remember:  Do not eat or drink after midnight the night before your surgery    Take these medicines the morning of surgery with A SIP OF WATER carvedilol (COREG)  fluticasone (FLONASE) -as needed HYDROcodone-acetaminophen (NORCO/VICODIN)-as needed mirabegron ER (MYRBETRIQ)  Eye drops  Follow your surgeon's instructions on when to stop Aspirin.  If no instructions were given by your surgeon then you will need to call the office to get those instructions.    7 days prior to surgery STOP taking any Aspirin (unless otherwise instructed by your surgeon), Aleve, Naproxen, Ibuprofen, Motrin, Advil, Goody's, BC's, all herbal medications, fish oil, and all vitamins. This includes: meloxicam (MOBIC)    The Morning of Surgery  Do not wear jewelry.  Do not wear lotions, powders, or colognes, or deodorant  Men may shave face and neck.  Do not bring valuables to the hospital.  [facility] is not responsible for any belongings or valuables.  If you are a smoker, DO NOT Smoke 24 hours prior to surgery  If you wear a CPAP at night please bring your mask the morning of surgery   Remember that you must have someone to  transport you home after your surgery, and remain with you for 24 hours if you are discharged the same day.   Please bring cases for contacts, glasses, hearing aids, dentures or bridgework because it cannot be worn into surgery.    Leave your suitcase in the car.  After surgery it may be brought to your room.  For patients admitted to the hospital, discharge time will be determined by your treatment team.  Patients discharged the day of surgery will not be allowed to drive home.    Special instructions:   [facility]- Preparing For Surgery  Before surgery, you can play an important role. Because skin is not sterile, your skin needs to be as free of germs as possible. You can reduce the number of germs on your skin by washing with CHG (chlorahexidine gluconate) Soap before surgery.  CHG is an antiseptic cleaner which kills germs and bonds with the skin to continue killing germs even after washing.    Oral Hygiene is also important to reduce your risk of infection.  Remember - BRUSH YOUR TEETH THE MORNING OF SURGERY WITH YOUR REGULAR TOOTHPASTE  Please do not use if you have an allergy to CHG or antibacterial soaps. If your skin becomes reddened/irritated stop using the CHG.  Do not shave (including legs and underarms) for at least 48 hours prior to first CHG shower. It is OK to shave your face.  Please follow these instructions carefully.   1. Shower the NIGHT BEFORE SURGERY and the MORNING OF SURGERY with  CHG Soap.   2. If you chose to wash your hair, wash your hair first as usual with your normal shampoo.  3. After you shampoo, rinse your hair and body thoroughly to remove the shampoo.  4. Use CHG as you would any other liquid soap. You can apply CHG directly to the skin and wash gently with a scrungie or a clean washcloth.   5. Apply the CHG Soap to your body ONLY FROM THE NECK DOWN.  Do not use on open wounds or open sores. Avoid contact with your eyes, ears, mouth and genitals  (private parts). Wash Face and genitals (private parts)  with your normal soap.   6. Wash thoroughly, paying special attention to the area where your surgery will be performed.  7. Thoroughly rinse your body with warm water from the neck down.  8. DO NOT shower/wash with your normal soap after using and rinsing off the CHG Soap.  9. Pat yourself dry with a CLEAN TOWEL.  10. Wear CLEAN PAJAMAS to bed the night before surgery, wear comfortable clothes the morning of surgery  11. Place CLEAN SHEETS on your bed the night of your first shower and DO NOT SLEEP WITH PETS.    Day of Surgery:  Please shower the morning of surgery with the CHG soap Do not apply any deodorants/lotions. Please wear clean clothes to the hospital/surgery center.   Remember to brush your teeth WITH YOUR REGULAR TOOTHPASTE.   Please read over the following fact sheets that you were given.

## 2019-09-20 ENCOUNTER — Encounter (HOSPITAL_COMMUNITY): Payer: Self-pay

## 2019-09-20 ENCOUNTER — Other Ambulatory Visit (HOSPITAL_COMMUNITY)
Admission: RE | Admit: 2019-09-20 | Discharge: 2019-09-20 | Disposition: A | Discharge status: Home / Self Care | Payer: PPO | Source: Ambulatory Visit | Attending: Neurosurgery | Admitting: Neurosurgery

## 2019-09-20 ENCOUNTER — Encounter (HOSPITAL_COMMUNITY)
Admission: RE | Admit: 2019-09-20 | Discharge: 2019-09-20 | Disposition: A | Discharge status: Home / Self Care | Payer: PPO | Source: Ambulatory Visit | Attending: Neurosurgery | Admitting: Neurosurgery

## 2019-09-20 ENCOUNTER — Other Ambulatory Visit: Payer: Self-pay

## 2019-09-20 DIAGNOSIS — Z791 Long term (current) use of non-steroidal anti-inflammatories (NSAID): Secondary | ICD-10-CM | POA: Diagnosis not present

## 2019-09-20 DIAGNOSIS — I509 Heart failure, unspecified: Secondary | ICD-10-CM | POA: Diagnosis present

## 2019-09-20 DIAGNOSIS — Z20822 Contact with and (suspected) exposure to covid-19: Secondary | ICD-10-CM | POA: Diagnosis present

## 2019-09-20 DIAGNOSIS — C670 Malignant neoplasm of trigone of bladder: Secondary | ICD-10-CM | POA: Diagnosis not present

## 2019-09-20 DIAGNOSIS — M5117 Intervertebral disc disorders with radiculopathy, lumbosacral region: Secondary | ICD-10-CM | POA: Diagnosis present

## 2019-09-20 DIAGNOSIS — I428 Other cardiomyopathies: Secondary | ICD-10-CM | POA: Diagnosis present

## 2019-09-20 DIAGNOSIS — I5022 Chronic systolic (congestive) heart failure: Secondary | ICD-10-CM | POA: Diagnosis not present

## 2019-09-20 DIAGNOSIS — M1990 Unspecified osteoarthritis, unspecified site: Secondary | ICD-10-CM | POA: Diagnosis present

## 2019-09-20 DIAGNOSIS — M48062 Spinal stenosis, lumbar region with neurogenic claudication: Secondary | ICD-10-CM | POA: Diagnosis present

## 2019-09-20 DIAGNOSIS — Z950 Presence of cardiac pacemaker: Secondary | ICD-10-CM | POA: Diagnosis not present

## 2019-09-20 DIAGNOSIS — Z7982 Long term (current) use of aspirin: Secondary | ICD-10-CM | POA: Diagnosis not present

## 2019-09-20 DIAGNOSIS — M5116 Intervertebral disc disorders with radiculopathy, lumbar region: Secondary | ICD-10-CM | POA: Diagnosis not present

## 2019-09-20 DIAGNOSIS — Z808 Family history of malignant neoplasm of other organs or systems: Secondary | ICD-10-CM | POA: Diagnosis not present

## 2019-09-20 DIAGNOSIS — I4510 Unspecified right bundle-branch block: Secondary | ICD-10-CM | POA: Diagnosis present

## 2019-09-20 DIAGNOSIS — Z01812 Encounter for preprocedural laboratory examination: Secondary | ICD-10-CM | POA: Insufficient documentation

## 2019-09-20 DIAGNOSIS — Z79899 Other long term (current) drug therapy: Secondary | ICD-10-CM | POA: Diagnosis not present

## 2019-09-20 DIAGNOSIS — S32010A Wedge compression fracture of first lumbar vertebra, initial encounter for closed fracture: Secondary | ICD-10-CM | POA: Diagnosis not present

## 2019-09-20 DIAGNOSIS — I110 Hypertensive heart disease with heart failure: Secondary | ICD-10-CM | POA: Diagnosis present

## 2019-09-20 DIAGNOSIS — Z87891 Personal history of nicotine dependence: Secondary | ICD-10-CM | POA: Diagnosis not present

## 2019-09-20 DIAGNOSIS — Z953 Presence of xenogenic heart valve: Secondary | ICD-10-CM | POA: Diagnosis not present

## 2019-09-20 DIAGNOSIS — M48061 Spinal stenosis, lumbar region without neurogenic claudication: Secondary | ICD-10-CM | POA: Diagnosis present

## 2019-09-20 DIAGNOSIS — Z8551 Personal history of malignant neoplasm of bladder: Secondary | ICD-10-CM | POA: Diagnosis not present

## 2019-09-20 HISTORY — DX: Unspecified osteoarthritis, unspecified site: M19.90

## 2019-09-20 LAB — CBC
HCT: 40.9 % (ref 39.0–52.0)
Hemoglobin: 13.9 g/dL (ref 13.0–17.0)
MCH: 32.3 pg (ref 26.0–34.0)
MCHC: 34 g/dL (ref 30.0–36.0)
MCV: 95.1 fL (ref 80.0–100.0)
Platelets: 218 10*3/uL (ref 150–400)
RBC: 4.3 MIL/uL (ref 4.22–5.81)
RDW: 13.4 % (ref 11.5–15.5)
WBC: 5.2 10*3/uL (ref 4.0–10.5)
nRBC: 0 % (ref 0.0–0.2)

## 2019-09-20 LAB — BASIC METABOLIC PANEL
Anion gap: 11 (ref 5–15)
BUN: 18 mg/dL (ref 8–23)
CO2: 24 mmol/L (ref 22–32)
Calcium: 9.6 mg/dL (ref 8.9–10.3)
Chloride: 104 mmol/L (ref 98–111)
Creatinine, Ser: 0.96 mg/dL (ref 0.61–1.24)
GFR calc Af Amer: >60 mL/min (ref >60)
GFR calc non Af Amer: >60 mL/min (ref >60)
Glucose, Bld: 113 mg/dL — ABNORMAL HIGH (ref 70–99)
Potassium: 4.3 mmol/L (ref 3.5–5.1)
Sodium: 139 mmol/L (ref 135–145)

## 2019-09-20 LAB — SURGICAL PCR SCREEN
MRSA, PCR: NEGATIVE
Staphylococcus aureus: NEGATIVE

## 2019-09-20 LAB — SARS CORONAVIRUS 2 (TAT 6-24 HRS): SARS Coronavirus 2: NEGATIVE

## 2019-09-20 NOTE — Progress Notes (Addendum)
PCP - Scarlette Ar Cardiologist - Philbert Riser Electrophysiology: Lovena Le  PPM/ICD - pacemaker Device Orders - faxed Rep Notified - [facility] by email, also notified Lindsi Forte  Chest x-ray - na EKG - 10/06/18 Stress Test - 1/18 ECHO - 6/20 Cardiac Cath - 1/18  Sleep Study - na CPAP -   Fasting Blood Sugar - na Checks Blood Sugar _____ times a day  Blood Thinner Instructions: Aspirin Instructions: stopped asa 1 week ago    COVID TEST- 09/20/19   Anesthesia review: cardiac hx./clearance note  From Dr. Philbert Riser. Pt reports he is having a cystoscopy tomorrow with Dr. [name] heddrick   Patient denies shortness of breath, fever, cough and chest pain at PAT appointment   All instructions explained to the patient, with a verbal understanding of the material. Patient agrees to go over the instructions while at home for a better understanding. Patient also instructed to self quarantine after being tested for COVID-19. The opportunity to ask questions was provided.

## 2019-09-20 NOTE — Progress Notes (Addendum)
Anesthesia Chart Review:  Case: [id number] Date/Time: 09/22/19 0930   Procedure: Laminectomy and Foraminotomy - L1-L2 - L2-L3 - L3-L4 - L4-L5 - L5-S1 (N/A )   Anesthesia type: General   Pre-op diagnosis: Stenosis   Location: [facility] OR ROOM 21 / [place] OR   Surgeons: Kary Kos, MD      DISCUSSION: Patient is a 64 year old male scheduled for the above procedure.   History includes former smoker (quit 12/30/07), non-ischemic cardiomyopathy, bicuspid AV with severe AR/dilated aortic root (s/p Bentall aortic root replacement with 27 mm pericardial tissue AVR 05/10/09), heart block (s/p Medtronic dual chamber BiV PPM 08/09/16), right BBB, HTN, bladder cancer (s/p TURBT x 2 2014; bladder biopsy/instillation [facility] 10/12/15; TURBT/instillation [place] 10/25/16, [place]; TURBT/TURP 05/21/18), elevated LFTs (2017; 05/29/16 [place]: hepatic parenchyma mildly heterogenous in appearance suggesting possible diffuse hepatocellular disease; normal LFTs 09/06/16). No current ETOH use.   Preoperative cardiology risk assessment per Dr[facility] "He should be okay to proceed. I would say moderate risk because of his history but relatively low risk procedure.  He reported that he had stopped ASA 1 week ago.   09/20/19 presurgical COVID-19 test is in process. Reportedly has a cystoscopy with his urologist Dr. Louis Meckel on 09/21/19. Anesthesia team to evaluate on the day of surgery. Medtronic PPM perioperative device form still pending from cardiology. Last interrogation appears to be from 08/18/19 with normal device function.    VS: BP 128/87   Pulse 75   Temp 37.1 C (Oral)   Resp 17   Ht 6' (1.829 m)   Wt 86.4 kg   SpO2 100%   BMI 25.82 kg/m     PROVIDERS: Delilah Shan, MD is PCP. Last evaluation 08/10/19. [facility]) - Alphia Moh, MD is cardiologist [facility] [place] [facility], see Care Everywhere). He was established there on 12/30/18 after transitioning care from Lauree Chandler, MD. - Cristopher Peru, MD is EP  cardiologist. Next visit scheduled for 11/17/19.   Louis Meckel, MD is urologist   LABS: Labs reviewed: Acceptable for surgery. LFTs and TSH WNL 08/10/19 [facility]). A1c 5.4% 08/10/19 [facility]).  (all labs ordered are listed, but only abnormal results are displayed)  Labs Reviewed  BASIC METABOLIC PANEL - Abnormal; Notable for the following components:      Result Value   Glucose, Bld 113 (*)    All other components within normal limits  SURGICAL PCR SCREEN  CBC     IMAGES: CT L-spine 08/31/19: IMPRESSION: 1. Advanced multilevel lumbar spondylosis as described above. Severe spinal canal and right neuroforaminal stenosis at L4-L5. 2. Moderate stenosis at L1-L2 and L2-L3. 3. Large right subarticular disc protrusion at L5-S1 with severe right lateral recess stenosis. 4. Mild-to-moderate spinal canal stenosis at L3-L4 with moderate left lateral recess and neuroforaminal stenosis. 5. Chronic moderate to severe L1 compression deformity.   EKG: 10/06/18: Atrial sensed, ventricular paced rhythm.   CV: Echo 01/06/19 [facility] 01/06/19): SUMMARY The left ventricle is moderately dilated. Mild left ventricular hypertrophy Left ventricular systolic function is mildly reduced. LV ejection fraction = 45-50%. Left ventricular filling pattern is prolonged relaxation. The left ventricular wall motion is normal. Device lead in the right ventricle The right ventricle is normal size. The right ventricular systolic function is normal. There is a bioprosthetic aortic valve. The prosthetic aortic valve is well-seated with normal function The aortic sinus is mildly dilated. The IVC is normal in size with an inspiratory collapse of greater then 50%,  suggesting normal right atrial pressure. There is no pericardial effusion. There is no comparison study available.  Cardiac cath 08/09/16 (done following 08/07/16 stress test showing no ischemia, but possible 2nd degree type 2 AV block):  The left  ventricular ejection fraction is 45-50% by visual estimate.  There is mild aortic valve stenosis, 25 mm Hg gradient on pullback.  Ost RCA to Prox RCA lesion, 25 %stenosed. Minimal luminal irregularities LAD and LCX.  Minimal left sided disease.  LV end diastolic pressure is normal. Plan for pacer placement later today.   Past Medical History:  Diagnosis Date  . Abnormal LFTs    a. eval in 2017 for this - abd [place] with diffuse hepatocellular disease.  . Aortic insufficiency    a. bicuspid AV/AI s/p bioprosthetic AVR and aortic root replacement 05/2009.  [name] Arthritis   . Bicuspid aortic valve   . Bladder cancer [facility])    recurrent bladder tumor /6 surgeries/ no chemo or radiation  . Dilated aortic root ([place])   . Essential hypertension    pt. denies  . Former consumption of alcohol   . Frequency of urination   . Headache    hx of cluster headaches  . Heart murmur   . Hepatocellular dysfunction   . History of aortic insufficiency   . NICM (nonischemic cardiomyopathy) ([place])    a. Previous EF 45-50% in 2009 - echo in 2014 reported EF 50-55% but upon review felt to be lower - f/u echo 07/2016 with EF 40-45%. (no CAD by cath in 2010).  . Nocturia   . Presence of permanent cardiac pacemaker    Left chest  . RBBB    hx of  . S/P aortic valve replacement with bioprosthetic valve 05-10-2009   BY DR Ricard Dillon   CARDIOLOGIST-  DR Angelena Form    Past Surgical History:  Procedure Laterality Date  . APPENDECTOMY  AGE 46  . BIOLOGICAL BENTALL AORTIC ROOT REPLACEMENT W/ PERICARDIAL TISSUE VALVE AND SYNTHETIC AORTIC GRAFT  05-10-2009  DR OWENS   BICUSPID AV W/ SEVERE AI &  ANEURYSM OF AORTIC ROOT AND PROXIMAL ASCENDING THORACIC AORTA  . CARDIAC CATHETERIZATION  08-10-2008  DR MCALHANY   NO EVIDENCE CAD/ MILD GLOBAL LVSF/ MODERATE AORTIC INSUFFICIENCY/ MILD DILATION OF THE AORTIC ROOT/ EF 45-50%  . CARDIAC CATHETERIZATION N/A 08/09/2016   Procedure: Left Heart Cath and Coronary Angiography;   Surgeon: Jettie Booze, MD;  Location: [place] [facility];  Service: Cardiovascular;  Laterality: N/A;  . CATARACT EXTRACTION W/ INTRAOCULAR LENS IMPLANT Right 2015  . CYSTOSCOPY W/ RETROGRADES Bilateral 12/31/2012   Procedure: CYSTOSCOPY WITH RETROGRADE PYELOGRAM With CYSTOGRAM AND DEEP BLADDER BIOPSY ;  Surgeon: Molli Hazard, MD;  [facility];  Service: Urology;  Laterality: Bilateral;  . CYSTOSCOPY W/ RETROGRADES Bilateral 10/12/2015   Procedure: BILATERAL RETROGRADE PYELOGRAM AND POST OP MITOMYCIN C;  Surgeon: Ardis Hughs, MD;  Location: [facility];  Service: Urology;  Laterality: Bilateral;  . CYSTOSCOPY W/ RETROGRADES Bilateral 10/25/2016   Procedure: CYSTOSCOPY WITH RETROGRADE PYELOGRAM;  Surgeon: Ardis Hughs, MD;  Location: [facility];  Service: Urology;  Laterality: Bilateral;  . CYSTOSCOPY WITH BIOPSY N/A 02/22/2013   Procedure: CYSTOSCOPY WITH BIOPSY;  Surgeon: Molli Hazard, MD;  Location: [facility];  Service: Urology;  Laterality: N/A;  . CYSTOSCOPY WITH BIOPSY N/A 08/02/2013   Procedure: CYSTOSCOPY WITH BLADDER BIOPSY AND  BILATERAL RETROGRADES;  Surgeon: Molli Hazard, MD;  Location: [facility];  Service: Urology;  Laterality: N/A;  . EP IMPLANTABLE DEVICE N/A 08/09/2016   Procedure: BiV Pacemaker Insertion CRT-P;  Surgeon: Evans Lance, MD;  Location: [place] [facility];  Service: Cardiovascular;  Laterality: N/A;  . HYDROCELE EXCISION / REPAIR  2009  . TRANSTHORACIC ECHOCARDIOGRAM  07-23-2013  DR MCALHANY   mild LVH,  ef 50-55%/   NORMAL FUNCTIONING BIOPROSTHETIC AORTIC VALVE (mean granient 63mmHg, peak gradient 83mmHg)/ 40mm prosthetic aortic root/  trivial PR/  mild RAE/  mild dilated RV  . TRANSURETHRAL RESECTION OF BLADDER TUMOR N/A 12/31/2012   Procedure: TRANSURETHRAL RESECTION OF BLADDER TUMOR (TURBT);  Surgeon: Molli Hazard, MD;  Location[facility];  Service: Urology;  Laterality: N/A;  . TRANSURETHRAL RESECTION OF BLADDER TUMOR N/A 10/12/2015   Procedure: TRANSURETHRAL RESECTION OF BLADDER TUMOR (TURBT);  Surgeon: Ardis Hughs, MD;  Location: [facility];  Service: Urology;  Laterality: N/A;  . TRANSURETHRAL RESECTION OF BLADDER TUMOR Bilateral 05/21/2018   Procedure: CYSTOSCOPY, TRANSURETHRAL RESECTION OF BLADDER TUMOR (TURBT), BILATERAL RETROGRADE PYELOGRAMS;  Surgeon: Ardis Hughs, MD;  [facility];  Service: Urology;  Laterality: Bilateral;  . TRANSURETHRAL RESECTION OF BLADDER TUMOR WITH MITOMYCIN-C N/A 10/25/2016   Procedure: TRANSURETHRAL RESECTION OF BLADDER TUMOR WITH MITOMYCIN-C;  Surgeon: Ardis Hughs, MD;  [facility];  Service: Urology;  Laterality: N/A;  . TRANSURETHRAL RESECTION OF PROSTATE N/A 05/21/2018   Procedure: TRANSURETHRAL RESECTION OF THE PROSTATE (TURP);  Surgeon: Ardis Hughs, MD;  Location[facility];  Service: Urology;  Laterality: N/A;    MEDICATIONS: . aspirin EC 325 MG tablet  . aspirin EC 81 MG tablet  . BENZOYL PEROXIDE ER EX  . BIOTIN PO  . carvedilol (COREG) 3.125 MG tablet  . Clotrimazole (JOCK ITCH EX)  . fluticasone (FLONASE) 50 MCG/ACT nasal spray  . HYDROcodone-acetaminophen (NORCO/VICODIN) 5-325 MG tablet  . ibuprofen (ADVIL) 200 MG tablet  . Lidocaine-Glycerin (PREPARATION H EX)  . lisinopril (ZESTRIL) 5 MG tablet  . Melatonin 10 MG CAPS  . meloxicam (MOBIC) 15 MG tablet  . mirabegron ER (MYRBETRIQ) 50 MG TB24 tablet  . Multiple Vitamin (MULTIVITAMIN) tablet  . Neomycin-Bacitracin-Polymyxin (TRIPLE ANTIBIOTIC EX)  . Omega-3 Fatty Acids (FISH OIL ULTRA) 1400 MG CAPS  . Polyethyl Glycol-Propyl Glycol (SYSTANE OP)  . sildenafil (VIAGRA) 100 MG tablet  . tamsulosin (FLOMAX) 0.4 MG CAPS capsule  . zolpidem (AMBIEN) 10 MG tablet   No current facility-administered medications for this encounter.    Myra Gianotti,  PA-C Surgical Short Stay/Anesthesiology [facility] Phone [phone] [facility] Phone [phone] 09/20/2019 3:36 PM

## 2019-09-20 NOTE — Anesthesia Preprocedure Evaluation (Addendum)
Anesthesia Evaluation  Patient identified by MRN, date of birth, ID band Patient awake    Reviewed: Allergy & Precautions, NPO status , Patient's Chart, lab work & pertinent test results  Airway Mallampati: I  TM Distance: >3 FB Neck ROM: Full    Dental  (+) Dental Advisory Given, Teeth Intact   Pulmonary former smoker,    breath sounds clear to auscultation       Cardiovascular hypertension, Pt. on medications and Pt. on home beta blockers +CHF  + dysrhythmias + pacemaker  Rhythm:Regular Rate:Normal  S/p bentall. Mildly reduced EF 40-45%.   Neuro/Psych  Headaches,    GI/Hepatic negative GI ROS, Neg liver ROS,   Endo/Other  negative endocrine ROS  Renal/GU negative Renal ROS     Musculoskeletal  (+) Arthritis ,   Abdominal   Peds  Hematology negative hematology ROS (+)   Anesthesia Other Findings   Reproductive/Obstetrics                           Anesthesia Physical Anesthesia Plan  ASA: III  Anesthesia Plan: General   Post-op Pain Management:    Induction: Intravenous  PONV Risk Score and Plan: 2 and Dexamethasone, Ondansetron and Treatment may vary due to age or medical condition  Airway Management Planned: Oral ETT  Additional Equipment:   Intra-op Plan:   Post-operative Plan: Extubation in OR  Informed Consent: I have reviewed the patients History and Physical, chart, labs and discussed the procedure including the risks, benefits and alternatives for the proposed anesthesia with the patient or authorized representative who has indicated his/her understanding and acceptance.     Dental advisory given  Plan Discussed with: CRNA  Anesthesia Plan Comments: ( )      Anesthesia Quick Evaluation

## 2019-09-21 DIAGNOSIS — C670 Malignant neoplasm of trigone of bladder: Secondary | ICD-10-CM | POA: Diagnosis not present

## 2019-09-22 ENCOUNTER — Encounter (HOSPITAL_COMMUNITY)
Admission: RE | Disposition: A | Discharge status: Home / Self Care | Payer: Self-pay | Source: Home / Self Care | Attending: Neurosurgery

## 2019-09-22 ENCOUNTER — Inpatient Hospital Stay (HOSPITAL_COMMUNITY)
Admission: RE | Admit: 2019-09-22 | Discharge: 2019-09-23 | DRG: 519 | Disposition: A | Discharge status: Home / Self Care | Payer: PPO | Attending: Neurosurgery | Admitting: Neurosurgery

## 2019-09-22 ENCOUNTER — Inpatient Hospital Stay (HOSPITAL_COMMUNITY): Payer: PPO | Admitting: Certified Registered Nurse Anesthetist

## 2019-09-22 ENCOUNTER — Inpatient Hospital Stay (HOSPITAL_COMMUNITY): Payer: PPO

## 2019-09-22 ENCOUNTER — Other Ambulatory Visit: Payer: Self-pay

## 2019-09-22 ENCOUNTER — Encounter (HOSPITAL_COMMUNITY): Payer: Self-pay | Admitting: Neurosurgery

## 2019-09-22 ENCOUNTER — Inpatient Hospital Stay (HOSPITAL_COMMUNITY): Payer: PPO | Admitting: Vascular Surgery

## 2019-09-22 DIAGNOSIS — Z808 Family history of malignant neoplasm of other organs or systems: Secondary | ICD-10-CM | POA: Diagnosis not present

## 2019-09-22 DIAGNOSIS — Z953 Presence of xenogenic heart valve: Secondary | ICD-10-CM

## 2019-09-22 DIAGNOSIS — I509 Heart failure, unspecified: Secondary | ICD-10-CM | POA: Diagnosis present

## 2019-09-22 DIAGNOSIS — Z87891 Personal history of nicotine dependence: Secondary | ICD-10-CM

## 2019-09-22 DIAGNOSIS — Z8551 Personal history of malignant neoplasm of bladder: Secondary | ICD-10-CM

## 2019-09-22 DIAGNOSIS — I4510 Unspecified right bundle-branch block: Secondary | ICD-10-CM | POA: Diagnosis present

## 2019-09-22 DIAGNOSIS — M48062 Spinal stenosis, lumbar region with neurogenic claudication: Secondary | ICD-10-CM | POA: Diagnosis present

## 2019-09-22 DIAGNOSIS — S32010A Wedge compression fracture of first lumbar vertebra, initial encounter for closed fracture: Secondary | ICD-10-CM | POA: Diagnosis not present

## 2019-09-22 DIAGNOSIS — Z20822 Contact with and (suspected) exposure to covid-19: Secondary | ICD-10-CM | POA: Diagnosis present

## 2019-09-22 DIAGNOSIS — I110 Hypertensive heart disease with heart failure: Secondary | ICD-10-CM | POA: Diagnosis present

## 2019-09-22 DIAGNOSIS — Z950 Presence of cardiac pacemaker: Secondary | ICD-10-CM | POA: Diagnosis not present

## 2019-09-22 DIAGNOSIS — Z791 Long term (current) use of non-steroidal anti-inflammatories (NSAID): Secondary | ICD-10-CM | POA: Diagnosis not present

## 2019-09-22 DIAGNOSIS — Z7982 Long term (current) use of aspirin: Secondary | ICD-10-CM

## 2019-09-22 DIAGNOSIS — M48061 Spinal stenosis, lumbar region without neurogenic claudication: Secondary | ICD-10-CM | POA: Diagnosis present

## 2019-09-22 DIAGNOSIS — M1990 Unspecified osteoarthritis, unspecified site: Secondary | ICD-10-CM | POA: Diagnosis present

## 2019-09-22 DIAGNOSIS — Z419 Encounter for procedure for purposes other than remedying health state, unspecified: Secondary | ICD-10-CM

## 2019-09-22 DIAGNOSIS — M5117 Intervertebral disc disorders with radiculopathy, lumbosacral region: Secondary | ICD-10-CM | POA: Diagnosis present

## 2019-09-22 DIAGNOSIS — Z79899 Other long term (current) drug therapy: Secondary | ICD-10-CM

## 2019-09-22 DIAGNOSIS — I428 Other cardiomyopathies: Secondary | ICD-10-CM | POA: Diagnosis present

## 2019-09-22 HISTORY — PX: LUMBAR LAMINECTOMY/DECOMPRESSION MICRODISCECTOMY: SHX5026

## 2019-09-22 LAB — TYPE AND SCREEN
ABO/RH(D): A POS
Antibody Screen: NEGATIVE

## 2019-09-22 SURGERY — LUMBAR LAMINECTOMY/DECOMPRESSION MICRODISCECTOMY 4 LEVEL
Anesthesia: General | Site: Back

## 2019-09-22 MED ORDER — BIOTIN 2.5 MG PO TABS: ORAL_TABLET | Freq: Every day | ORAL | Status: DC

## 2019-09-22 MED ORDER — CARVEDILOL 3.125 MG PO TABS
3.1250 mg | ORAL_TABLET | Freq: Two times a day (BID) | ORAL | Status: DC
  Administered 2019-09-22: 3.125 mg via ORAL
  Filled 2019-09-22: qty 1

## 2019-09-22 MED ORDER — LIDOCAINE 2% (20 MG/ML) 5 ML SYRINGE
INTRAMUSCULAR | Status: DC | PRN
  Administered 2019-09-22: 60 mg via INTRAVENOUS

## 2019-09-22 MED ORDER — THROMBIN 5000 UNITS EX SOLR
CUTANEOUS | Status: AC
  Filled 2019-09-22: qty 5000

## 2019-09-22 MED ORDER — ZOLPIDEM TARTRATE 5 MG PO TABS
5.0000 mg | ORAL_TABLET | Freq: Every evening | ORAL | Status: DC | PRN
  Administered 2019-09-22: 10 mg via ORAL
  Filled 2019-09-22: qty 2

## 2019-09-22 MED ORDER — ONDANSETRON HCL 4 MG/2ML IJ SOLN: 4.0000 mg | Freq: Once | INTRAMUSCULAR | Status: DC | PRN

## 2019-09-22 MED ORDER — ONDANSETRON HCL 4 MG/2ML IJ SOLN: 4.0000 mg | Freq: Four times a day (QID) | INTRAMUSCULAR | Status: DC | PRN

## 2019-09-22 MED ORDER — LISINOPRIL 5 MG PO TABS
5.0000 mg | ORAL_TABLET | Freq: Every day | ORAL | Status: DC
  Filled 2019-09-22: qty 1

## 2019-09-22 MED ORDER — PHENYLEPHRINE 40 MCG/ML (10ML) SYRINGE FOR IV PUSH (FOR BLOOD PRESSURE SUPPORT)
PREFILLED_SYRINGE | INTRAVENOUS | Status: DC | PRN
  Administered 2019-09-22: 120 ug via INTRAVENOUS
  Administered 2019-09-22: 40 ug via INTRAVENOUS
  Administered 2019-09-22 (×3): 80 ug via INTRAVENOUS

## 2019-09-22 MED ORDER — ACETAMINOPHEN 325 MG PO TABS: 650.0000 mg | ORAL_TABLET | ORAL | Status: DC | PRN

## 2019-09-22 MED ORDER — ALUM & MAG HYDROXIDE-SIMETH 200-200-20 MG/5ML PO SUSP: 30.0000 mL | Freq: Four times a day (QID) | ORAL | Status: DC | PRN

## 2019-09-22 MED ORDER — LACTATED RINGERS IV SOLN: INTRAVENOUS | Status: DC | PRN

## 2019-09-22 MED ORDER — FLUTICASONE PROPIONATE 50 MCG/ACT NA SUSP
1.0000 | Freq: Every day | NASAL | Status: DC | PRN
  Filled 2019-09-22: qty 16

## 2019-09-22 MED ORDER — MIDAZOLAM HCL 5 MG/5ML IJ SOLN
INTRAMUSCULAR | Status: DC | PRN
  Administered 2019-09-22: 2 mg via INTRAVENOUS

## 2019-09-22 MED ORDER — SODIUM CHLORIDE 0.9% FLUSH: 3.0000 mL | INTRAVENOUS | Status: DC | PRN

## 2019-09-22 MED ORDER — SILDENAFIL CITRATE 100 MG PO TABS: 100.0000 mg | ORAL_TABLET | Freq: Every day | ORAL | Status: DC | PRN

## 2019-09-22 MED ORDER — ONDANSETRON HCL 4 MG/2ML IJ SOLN
INTRAMUSCULAR | Status: DC | PRN
  Administered 2019-09-22: 4 mg via INTRAVENOUS

## 2019-09-22 MED ORDER — TRIPLE ANTIBIOTIC 3.5-400-5000 EX OINT: TOPICAL_OINTMENT | Freq: Every day | CUTANEOUS | Status: DC | PRN

## 2019-09-22 MED ORDER — MELATONIN 3 MG PO TABS
9.0000 mg | ORAL_TABLET | Freq: Every evening | ORAL | Status: DC | PRN
  Filled 2019-09-22: qty 3

## 2019-09-22 MED ORDER — BENZOYL PEROXIDE ER 4.4 % EX LQCR: Freq: Every day | CUTANEOUS | Status: DC | PRN

## 2019-09-22 MED ORDER — FISH OIL ULTRA 1400 MG PO CAPS: 1400.0000 mg | ORAL_CAPSULE | Freq: Two times a day (BID) | ORAL | Status: DC

## 2019-09-22 MED ORDER — ASPIRIN EC 325 MG PO TBEC
325.0000 mg | DELAYED_RELEASE_TABLET | Freq: Every day | ORAL | Status: DC
  Filled 2019-09-22: qty 1

## 2019-09-22 MED ORDER — LIDOCAINE 2% (20 MG/ML) 5 ML SYRINGE
INTRAMUSCULAR | Status: AC
  Filled 2019-09-22: qty 5

## 2019-09-22 MED ORDER — MIRABEGRON ER 50 MG PO TB24
50.0000 mg | ORAL_TABLET | Freq: Every day | ORAL | Status: DC
  Filled 2019-09-22: qty 1

## 2019-09-22 MED ORDER — 0.9 % SODIUM CHLORIDE (POUR BTL) OPTIME
TOPICAL | Status: DC | PRN
  Administered 2019-09-22: 10:00:00 1000 mL

## 2019-09-22 MED ORDER — SODIUM CHLORIDE 0.9 % IV SOLN: 250.0000 mL | INTRAVENOUS | Status: DC

## 2019-09-22 MED ORDER — ROCURONIUM BROMIDE 10 MG/ML (PF) SYRINGE
PREFILLED_SYRINGE | INTRAVENOUS | Status: AC
  Filled 2019-09-22: qty 10

## 2019-09-22 MED ORDER — SODIUM CHLORIDE 0.9% FLUSH
3.0000 mL | Freq: Two times a day (BID) | INTRAVENOUS | Status: DC
  Administered 2019-09-22 (×2): 3 mL via INTRAVENOUS

## 2019-09-22 MED ORDER — SUCCINYLCHOLINE CHLORIDE 200 MG/10ML IV SOSY
PREFILLED_SYRINGE | INTRAVENOUS | Status: AC
  Filled 2019-09-22: qty 10

## 2019-09-22 MED ORDER — SUCCINYLCHOLINE CHLORIDE 200 MG/10ML IV SOSY
PREFILLED_SYRINGE | INTRAVENOUS | Status: DC | PRN
  Administered 2019-09-22: 100 mg via INTRAVENOUS

## 2019-09-22 MED ORDER — LIDOCAINE-EPINEPHRINE 1 %-1:100000 IJ SOLN
INTRAMUSCULAR | Status: AC
  Filled 2019-09-22: qty 1

## 2019-09-22 MED ORDER — PROPOFOL 10 MG/ML IV BOLUS
INTRAVENOUS | Status: DC | PRN
  Administered 2019-09-22: 110 mg via INTRAVENOUS

## 2019-09-22 MED ORDER — PHENOL 1.4 % MT LIQD: 1.0000 | OROMUCOSAL | Status: DC | PRN

## 2019-09-22 MED ORDER — HYDROCORTISONE 1 % EX CREA: TOPICAL_CREAM | Freq: Every day | CUTANEOUS | Status: DC | PRN

## 2019-09-22 MED ORDER — FENTANYL CITRATE (PF) 250 MCG/5ML IJ SOLN
INTRAMUSCULAR | Status: DC | PRN
  Administered 2019-09-22: 100 ug via INTRAVENOUS
  Administered 2019-09-22 (×5): 50 ug via INTRAVENOUS

## 2019-09-22 MED ORDER — SODIUM CHLORIDE 0.9 % IV SOLN: INTRAVENOUS | Status: DC | PRN

## 2019-09-22 MED ORDER — HYDROMORPHONE HCL 1 MG/ML IJ SOLN
1.0000 mg | INTRAMUSCULAR | Status: DC | PRN
  Administered 2019-09-22: 1 mg via INTRAVENOUS
  Filled 2019-09-22: qty 1

## 2019-09-22 MED ORDER — FENTANYL CITRATE (PF) 250 MCG/5ML IJ SOLN
INTRAMUSCULAR | Status: AC
  Filled 2019-09-22: qty 5

## 2019-09-22 MED ORDER — BUPIVACAINE LIPOSOME 1.3 % IJ SUSP
20.0000 mL | INTRAMUSCULAR | Status: AC
  Administered 2019-09-22: 12:00:00 20 mL
  Filled 2019-09-22: qty 20

## 2019-09-22 MED ORDER — ADULT MULTIVITAMIN W/MINERALS CH
1.0000 | ORAL_TABLET | Freq: Two times a day (BID) | ORAL | Status: DC
  Administered 2019-09-22: 1 via ORAL
  Filled 2019-09-22: qty 1

## 2019-09-22 MED ORDER — LIDOCAINE-EPINEPHRINE 1 %-1:100000 IJ SOLN
INTRAMUSCULAR | Status: DC | PRN
  Administered 2019-09-22: 10 mL

## 2019-09-22 MED ORDER — ONDANSETRON HCL 4 MG PO TABS: 4.0000 mg | ORAL_TABLET | Freq: Four times a day (QID) | ORAL | Status: DC | PRN

## 2019-09-22 MED ORDER — MELOXICAM 7.5 MG PO TABS
15.0000 mg | ORAL_TABLET | Freq: Every day | ORAL | Status: DC
  Filled 2019-09-22: qty 2

## 2019-09-22 MED ORDER — CEFAZOLIN SODIUM 1 G IJ SOLR
INTRAMUSCULAR | Status: AC
  Filled 2019-09-22: qty 20

## 2019-09-22 MED ORDER — ALBUMIN HUMAN 5 % IV SOLN: INTRAVENOUS | Status: DC | PRN

## 2019-09-22 MED ORDER — CEFAZOLIN SODIUM-DEXTROSE 2-3 GM-%(50ML) IV SOLR
INTRAVENOUS | Status: DC | PRN
  Administered 2019-09-22: 2 g via INTRAVENOUS

## 2019-09-22 MED ORDER — THROMBIN 5000 UNITS EX SOLR: OROMUCOSAL | Status: DC | PRN

## 2019-09-22 MED ORDER — MENTHOL 3 MG MT LOZG: 1.0000 | LOZENGE | OROMUCOSAL | Status: DC | PRN

## 2019-09-22 MED ORDER — SODIUM CHLORIDE 0.9% IV SOLUTION: Freq: Once | INTRAVENOUS | Status: DC

## 2019-09-22 MED ORDER — MIDAZOLAM HCL 2 MG/2ML IJ SOLN
INTRAMUSCULAR | Status: AC
  Filled 2019-09-22: qty 2

## 2019-09-22 MED ORDER — BUPIVACAINE HCL (PF) 0.25 % IJ SOLN
INTRAMUSCULAR | Status: AC
  Filled 2019-09-22: qty 30

## 2019-09-22 MED ORDER — HYDROCODONE-ACETAMINOPHEN 5-325 MG PO TABS
ORAL_TABLET | ORAL | Status: AC
  Administered 2019-09-22: 1 via ORAL
  Filled 2019-09-22: qty 1

## 2019-09-22 MED ORDER — HYDROCODONE-ACETAMINOPHEN 5-325 MG PO TABS
1.0000 | ORAL_TABLET | ORAL | Status: DC | PRN
  Administered 2019-09-22 – 2019-09-23 (×3): 2 via ORAL
  Administered 2019-09-23: 1 via ORAL
  Filled 2019-09-22 (×2): qty 2
  Filled 2019-09-22: qty 1
  Filled 2019-09-22: qty 2

## 2019-09-22 MED ORDER — CLOTRIMAZOLE 1 % EX CREA: TOPICAL_CREAM | Freq: Every day | CUTANEOUS | Status: DC | PRN

## 2019-09-22 MED ORDER — FENTANYL CITRATE (PF) 100 MCG/2ML IJ SOLN: 25.0000 ug | INTRAMUSCULAR | Status: DC | PRN

## 2019-09-22 MED ORDER — PANTOPRAZOLE SODIUM 40 MG PO TBEC
40.0000 mg | DELAYED_RELEASE_TABLET | Freq: Every day | ORAL | Status: DC
  Administered 2019-09-22: 40 mg via ORAL
  Filled 2019-09-22: qty 1

## 2019-09-22 MED ORDER — PANTOPRAZOLE SODIUM 40 MG IV SOLR: 40.0000 mg | Freq: Every day | INTRAVENOUS | Status: DC

## 2019-09-22 MED ORDER — LACTATED RINGERS IV SOLN: INTRAVENOUS | Status: DC

## 2019-09-22 MED ORDER — THROMBIN 20000 UNITS EX SOLR
CUTANEOUS | Status: AC
  Filled 2019-09-22: qty 20000

## 2019-09-22 MED ORDER — SUGAMMADEX SODIUM 200 MG/2ML IV SOLN
INTRAVENOUS | Status: DC | PRN
  Administered 2019-09-22: 200 mg via INTRAVENOUS

## 2019-09-22 MED ORDER — PHENYLEPHRINE HCL-NACL 10-0.9 MG/250ML-% IV SOLN
INTRAVENOUS | Status: DC | PRN
  Administered 2019-09-22: 25 ug/min via INTRAVENOUS

## 2019-09-22 MED ORDER — CEFAZOLIN SODIUM-DEXTROSE 2-4 GM/100ML-% IV SOLN
2.0000 g | Freq: Three times a day (TID) | INTRAVENOUS | Status: AC
  Administered 2019-09-22 – 2019-09-23 (×2): 2 g via INTRAVENOUS
  Filled 2019-09-22 (×2): qty 100

## 2019-09-22 MED ORDER — POLYETHYL GLYCOL-PROPYL GLYCOL 0.4-0.3 % OP GEL: Freq: Every day | OPHTHALMIC | Status: DC

## 2019-09-22 MED ORDER — THROMBIN 20000 UNITS EX SOLR: CUTANEOUS | Status: DC | PRN

## 2019-09-22 MED ORDER — TAMSULOSIN HCL 0.4 MG PO CAPS
0.4000 mg | ORAL_CAPSULE | Freq: Every day | ORAL | Status: DC
  Administered 2019-09-22: 0.4 mg via ORAL
  Filled 2019-09-22: qty 1

## 2019-09-22 MED ORDER — HYDROCODONE-ACETAMINOPHEN 5-325 MG PO TABS: 1.0000 | ORAL_TABLET | Freq: Every day | ORAL | Status: DC

## 2019-09-22 MED ORDER — ROCURONIUM BROMIDE 10 MG/ML (PF) SYRINGE
PREFILLED_SYRINGE | INTRAVENOUS | Status: DC | PRN
  Administered 2019-09-22 (×2): 20 mg via INTRAVENOUS
  Administered 2019-09-22: 30 mg via INTRAVENOUS
  Administered 2019-09-22: 50 mg via INTRAVENOUS

## 2019-09-22 MED ORDER — PROPOFOL 10 MG/ML IV BOLUS
INTRAVENOUS | Status: AC
  Filled 2019-09-22: qty 20

## 2019-09-22 MED ORDER — ACETAMINOPHEN 650 MG RE SUPP: 650.0000 mg | RECTAL | Status: DC | PRN

## 2019-09-22 MED ORDER — DEXAMETHASONE SODIUM PHOSPHATE 10 MG/ML IJ SOLN
INTRAMUSCULAR | Status: DC | PRN
  Administered 2019-09-22: 10 mg via INTRAVENOUS

## 2019-09-22 MED ORDER — CYCLOBENZAPRINE HCL 10 MG PO TABS
10.0000 mg | ORAL_TABLET | Freq: Three times a day (TID) | ORAL | Status: DC | PRN
  Administered 2019-09-22: 10 mg via ORAL
  Filled 2019-09-22: qty 1

## 2019-09-22 MED ORDER — CYCLOBENZAPRINE HCL 10 MG PO TABS
ORAL_TABLET | ORAL | Status: AC
  Administered 2019-09-22: 10 mg via ORAL
  Filled 2019-09-22: qty 1

## 2019-09-22 MED ORDER — IBUPROFEN 400 MG PO TABS: 400.0000 mg | ORAL_TABLET | Freq: Four times a day (QID) | ORAL | Status: DC | PRN

## 2019-09-22 MED ORDER — ONDANSETRON HCL 4 MG/2ML IJ SOLN
INTRAMUSCULAR | Status: AC
  Filled 2019-09-22: qty 2

## 2019-09-22 SURGICAL SUPPLY — 63 items
BAG DECANTER FOR FLEXI CONT (MISCELLANEOUS) ×3 IMPLANT
BAND RUBBER #18 3X1/16 STRL (MISCELLANEOUS) ×6 IMPLANT
BENZOIN TINCTURE PRP APPL 2/3 (GAUZE/BANDAGES/DRESSINGS) ×6 IMPLANT
BLADE CLIPPER SURG (BLADE) ×3 IMPLANT
BLADE SURG 11 STRL SS (BLADE) ×3 IMPLANT
BUR CUTTER 7.0 ROUND (BURR) ×3 IMPLANT
BUR MATCHSTICK NEURO 3.0 LAGG (BURR) ×3 IMPLANT
CANISTER SUCT 3000ML PPV (MISCELLANEOUS) ×3 IMPLANT
CARTRIDGE OIL MAESTRO DRILL (MISCELLANEOUS) ×1 IMPLANT
CLOSURE WOUND 1/2 X4 (GAUZE/BANDAGES/DRESSINGS) ×2
COVER WAND RF STERILE (DRAPES) IMPLANT
DECANTER SPIKE VIAL GLASS SM (MISCELLANEOUS) ×3 IMPLANT
DERMABOND ADVANCED (GAUZE/BANDAGES/DRESSINGS) ×4
DERMABOND ADVANCED .7 DNX12 (GAUZE/BANDAGES/DRESSINGS) ×2 IMPLANT
DIFFUSER DRILL AIR PNEUMATIC (MISCELLANEOUS) ×3 IMPLANT
DRAPE HALF SHEET 40X57 (DRAPES) ×6 IMPLANT
DRAPE LAPAROTOMY 100X72X124 (DRAPES) ×3 IMPLANT
DRAPE MICROSCOPE LEICA (MISCELLANEOUS) ×3 IMPLANT
DRAPE SURG 17X23 STRL (DRAPES) ×3 IMPLANT
DRSG OPSITE 4X5.5 SM (GAUZE/BANDAGES/DRESSINGS) ×3 IMPLANT
DRSG OPSITE POSTOP 4X8 (GAUZE/BANDAGES/DRESSINGS) ×3 IMPLANT
DURAPREP 26ML APPLICATOR (WOUND CARE) ×3 IMPLANT
ELECT REM PT RETURN 9FT ADLT (ELECTROSURGICAL) ×3
ELECTRODE REM PT RTRN 9FT ADLT (ELECTROSURGICAL) ×1 IMPLANT
EVACUATOR 1/8 PVC DRAIN (DRAIN) ×3 IMPLANT
GAUZE 4X4 16PLY RFD (DISPOSABLE) ×3 IMPLANT
GAUZE SPONGE 4X4 12PLY STRL (GAUZE/BANDAGES/DRESSINGS) ×3 IMPLANT
GLOVE BIO SURGEON STRL SZ7 (GLOVE) ×3 IMPLANT
GLOVE BIO SURGEON STRL SZ8 (GLOVE) ×3 IMPLANT
GLOVE BIOGEL PI IND STRL 7.0 (GLOVE) ×1 IMPLANT
GLOVE BIOGEL PI IND STRL 7.5 (GLOVE) ×3 IMPLANT
GLOVE BIOGEL PI INDICATOR 7.0 (GLOVE) ×2
GLOVE BIOGEL PI INDICATOR 7.5 (GLOVE) ×6
GLOVE ECLIPSE 7.5 STRL STRAW (GLOVE) IMPLANT
GLOVE EXAM NITRILE XL STR (GLOVE) IMPLANT
GLOVE INDICATOR 8.5 STRL (GLOVE) ×3 IMPLANT
GLOVE SURG SS PI 7.0 STRL IVOR (GLOVE) ×12 IMPLANT
GOWN STRL REUS W/ TWL LRG LVL3 (GOWN DISPOSABLE) ×1 IMPLANT
GOWN STRL REUS W/ TWL XL LVL3 (GOWN DISPOSABLE) ×3 IMPLANT
GOWN STRL REUS W/TWL 2XL LVL3 (GOWN DISPOSABLE) IMPLANT
GOWN STRL REUS W/TWL LRG LVL3 (GOWN DISPOSABLE) ×2
GOWN STRL REUS W/TWL XL LVL3 (GOWN DISPOSABLE) ×6
HEMOSTAT POWDER KIT SURGIFOAM (HEMOSTASIS) ×3 IMPLANT
KIT BASIN OR (CUSTOM PROCEDURE TRAY) ×3 IMPLANT
KIT TURNOVER KIT B (KITS) ×3 IMPLANT
NEEDLE HYPO 21X1.5 SAFETY (NEEDLE) ×3 IMPLANT
NEEDLE HYPO 22GX1.5 SAFETY (NEEDLE) ×3 IMPLANT
NEEDLE SPNL 22GX3.5 QUINCKE BK (NEEDLE) ×3 IMPLANT
NS IRRIG 1000ML POUR BTL (IV SOLUTION) ×3 IMPLANT
OIL CARTRIDGE MAESTRO DRILL (MISCELLANEOUS) ×3
PACK LAMINECTOMY NEURO (CUSTOM PROCEDURE TRAY) ×3 IMPLANT
SPONGE SURGIFOAM ABS GEL 100 (HEMOSTASIS) ×3 IMPLANT
SPONGE SURGIFOAM ABS GEL SZ50 (HEMOSTASIS) IMPLANT
STRIP CLOSURE SKIN 1/2X4 (GAUZE/BANDAGES/DRESSINGS) ×4 IMPLANT
SUT VIC AB 0 CT1 18XCR BRD8 (SUTURE) ×2 IMPLANT
SUT VIC AB 0 CT1 8-18 (SUTURE) ×4
SUT VIC AB 2-0 CT1 18 (SUTURE) ×6 IMPLANT
SUT VIC AB 4-0 PS2 27 (SUTURE) ×3 IMPLANT
SYR 20ML LL LF (SYRINGE) ×3 IMPLANT
TOWEL GREEN STERILE (TOWEL DISPOSABLE) ×3 IMPLANT
TOWEL GREEN STERILE FF (TOWEL DISPOSABLE) ×3 IMPLANT
TRAY FOLEY MTR SLVR 16FR STAT (SET/KITS/TRAYS/PACK) ×3 IMPLANT
WATER STERILE IRR 1000ML POUR (IV SOLUTION) ×3 IMPLANT

## 2019-09-22 NOTE — Op Note (Signed)
Preoperative diagnosis: Severe lumbar spinal stenosis with bilateral L2-L5 radiculopathies and right S1 radiculopathy from herniated nucleus pulposus L5-S1 right  Postoperative diagnosis: Same  Procedure: Decompressive lumbar laminectomies L1-2, L2-3, L3-4, L4-5 with partial medial facetectomies and foraminotomies of the L2 nerve roots, L3, L4, and L5 bilaterally and right-sided L5-S1 microdiscectomy with microdissection of the right S1 nerve root microscopic discectomy  Surgeon: Dominica Severin Gali Spinney  Assistant: Nash Shearer  Anesthesia: General  EBL: Minimal  HPI: 64 year old General with bilateral leg pain back pain neurogenic claudication work-up revealed complete block at L4-5 severe lumbar spinal stenosis at L1-2, L2-3, L3-4 and herniated disc at L5-S1 the right.  Due to patient's progression of clinical syndrome imaging findings failed conservative treatment I recommended decompressive laminectomies from L1-L5 with a right-sided microdiscectomy L5-S1 I extensively went over the risks and benefits of the operation with him as well as perioperative course expectations of outcome alternatives of surgery and he understood and agreed to proceed forward.  Operative procedure: Patient was brought into the OR was induced under general anesthesia positioned prone on the Wilson frame his back was prepped and draped in routine sterile fashion.  I used utilizing anatomic landmarks I drilled a midline incision infiltrated with 10 cc lidocaine with epi and opened up an incision extending from L2 down to S1 confirmed location of the L3 pedicle with intraoperative x-ray.  I then proceeded to remove the entire spinous process of L2 part of the inferior spinous process at L1 the entire spinous process at L3 and L4 and part of the superior spinous process at L5.  I began the central laminotomy at L2-3 extended up through L2 and into the inferior aspect of L1 and then marched inferiorly.  There was an extensive amount of  hourglass compression thecal sac at L2-3, L3-4 and especially at L4-5.  Marked facet arthropathy marked ligamentous hypertrophy.  I aggressively under bit the medial facet complexes marching down the right side and then marching down the left performing foraminotomies of the L2, L3, L4, and L5 nerve roots bilaterally.  After adequate decompression been achieved and foraminotomies at those levels I then drilled down the laminotomy at L5-S1 on the right performed laminotomy with a 230 Kerrison punch remove the ligamentum flavum draped the operating microscope and brought into the field.  Under microscopic lamination the right S1 nerve was dissected off large disclamation still partially contained with the ligament.  Cleaned out the disc with several large free fragments of disc decompressing thecal sac and right S1 nerve root.  At the discectomy was no further stenosis was then copiously irrigated meticulous hemostasis was maintained a medium Hemovac drain was placed the wound was closed in layers with Vicryl Exparel was injected the fascia then interrupted Vicryl used to close the subcutaneous tissue and a running 4 subcuticular in the skin.  Dermabond benzoin Steri-Strips and a sterile dressing was applied patient recovery in stable condition.  At the end the case all needle count sponge counts were correct.

## 2019-09-22 NOTE — Evaluation (Signed)
Physical Therapy Evaluation Patient Details Name: [name] MRN: [id number] DOB: 1956/01/11 Today's Date: 09/22/2019   History of Present Illness  Pt is a 64 y/o male s/p L1-5 laminectomy. PMH includes bladder cancer, HTN, and s/p AVR.   Clinical Impression  Patient is s/p above surgery resulting in the deficits listed below (see PT Problem List). Pt guarded during gait, requiring min guard to min A. Educated about back precautions and generalized walking program.  Patient will benefit from skilled PT to increase their independence and safety with mobility (while adhering to their precautions) to allow discharge to the venue listed below.     Follow Up Recommendations No PT follow up    Equipment Recommendations  None recommended by PT    Recommendations for Other Services       Precautions / Restrictions Precautions Precautions: Back Precaution Booklet Issued: Yes (comment) Precaution Comments: Reviewed back precautions.  Restrictions Weight Bearing Restrictions: No      Mobility  Bed Mobility Overal bed mobility: Needs Assistance Bed Mobility: Rolling;Sidelying to Sit;Sit to Sidelying Rolling: Supervision Sidelying to sit: Supervision     Sit to sidelying: Supervision General bed mobility comments: Supervision to ensure log roll technique. Cues for sequencing.   Transfers Overall transfer level: Needs assistance Equipment used: None Transfers: Sit to/from Stand Sit to Stand: Min assist         General transfer comment: Min A for steadying assist.   Ambulation/Gait Ambulation/Gait assistance: Min guard Gait Distance (Feet): 200 Feet Assistive device: 1 person hand held assist;IV Pole Gait Pattern/deviations: Step-through pattern;Decreased stride length Gait velocity: Decreased   General Gait Details: Slow, cautious gait. Min guard for safety. Educated about walking program to perform at home.   Stairs            Wheelchair Mobility     Modified Rankin (Stroke Patients Only)       Balance Overall balance assessment: Needs assistance Sitting-balance support: No upper extremity supported;Feet supported Sitting balance-Leahy Scale: Good     Standing balance support: Bilateral upper extremity supported;During functional activity;No upper extremity supported Standing balance-Leahy Scale: Fair Standing balance comment: Able to maintain static standing without UE support                             Pertinent Vitals/Pain Pain Assessment: Faces Faces Pain Scale: Hurts little more Pain Location: back Pain Descriptors / Indicators: Aching;Operative site guarding Pain Intervention(s): Monitored during session;Limited activity within patient's tolerance;Repositioned    Home Living Family/patient expects to be discharged to:: Private residence Living Arrangements: Alone Available Help at Discharge: Family;Available 24 hours/day Type of Home: House Home Access: Level entry     Home Layout: One level Home Equipment: Walker - 2 wheels      Prior Function Level of Independence: Independent               Hand Dominance        Extremity/Trunk Assessment   Upper Extremity Assessment Upper Extremity Assessment: Defer to OT evaluation    Lower Extremity Assessment Lower Extremity Assessment: Generalized weakness(reports LE pain improved)    Cervical / Trunk Assessment Cervical / Trunk Assessment: Other exceptions Cervical / Trunk Exceptions: s/p lumbar surgery  Communication   Communication: No difficulties  Cognition Arousal/Alertness: Awake/alert Behavior During Therapy: WFL for tasks assessed/performed Overall Cognitive Status: Within Functional Limits for tasks assessed  General Comments      Exercises     Assessment/Plan    PT Assessment Patient needs continued PT services  PT Problem List Decreased strength;Decreased  balance;Decreased activity tolerance;Decreased mobility;Decreased knowledge of precautions;Pain       PT Treatment Interventions DME instruction;Gait training;Functional mobility training;Therapeutic activities;Therapeutic exercise;Balance training;Patient/family education    PT Goals (Current goals can be found in the Care Plan section)  Acute Rehab PT Goals Patient Stated Goal: to go home PT Goal Formulation: With patient Time For Goal Achievement: 10/06/19 Potential to Achieve Goals: Good    Frequency Min 5X/week   Barriers to discharge        Co-evaluation               AM-PAC PT "6 Clicks" Mobility  Outcome Measure Help needed turning from your back to your side while in a flat bed without using bedrails?: None Help needed moving from lying on your back to sitting on the side of a flat bed without using bedrails?: A Little Help needed moving to and from a bed to a chair (including a wheelchair)?: A Little Help needed standing up from a chair using your arms (e.g., wheelchair or bedside chair)?: A Little Help needed to walk in hospital room?: A Little Help needed climbing 3-5 steps with a railing? : A Little 6 Click Score: 19    End of Session Equipment Utilized During Treatment: Gait belt Activity Tolerance: Patient tolerated treatment well Patient left: in bed;with call bell/phone within reach;with family/visitor present Nurse Communication: Mobility status PT Visit Diagnosis: Other abnormalities of gait and mobility (R26.89);Pain Pain - part of body: (back)    Time: [id number] PT Time Calculation (min) (ACUTE ONLY): 20 min   Charges:   PT Evaluation $PT Eval Low Complexity: 1 Low          Lou Miner, DPT  Acute Rehabilitation Services  Pager: [phone] Office: [phone]   Rudean Hitt 09/22/2019, 6:37 PM

## 2019-09-22 NOTE — H&P (Signed)
[name] is an 64 y.o. male.   Chief Complaint: Back bilateral leg pain neurogenic claudication HPI: 64 year old gentleman longstanding back and bilateral hip and leg pain neurogenic claudication.  Work-up revealed severe spinal stenosis with virtual complete block by MRI criteria with nerve clumping throughout his lumbar spine extending from the bottom of L1-L2down to L5 with a disc at L5-S1.  Due to patient's failed conservative treatment imaging findings and progression of clinical syndrome I recommended decompressive laminectomies from L1-L2 down to L5-S1.  I have extensively gone over the risks and benefits of that operation with him as well as perioperative course expectations of outcome and alternatives of surgery and he understands and agrees to proceed forward.  Past Medical History:  Diagnosis Date  . Abnormal LFTs    a. eval in 2017 for this - abd [place] with diffuse hepatocellular disease.  . Aortic insufficiency    a. bicuspid AV/AI s/p bioprosthetic AVR and aortic root replacement 05/2009.  [name] Arthritis   . Bicuspid aortic valve   . Bladder cancer [facility])    recurrent bladder tumor /6 surgeries/ no chemo or radiation  . Dilated aortic root ([place])   . Essential hypertension    pt. denies  . Former consumption of alcohol   . Frequency of urination   . Headache    hx of cluster headaches  . Heart murmur   . Hepatocellular dysfunction   . History of aortic insufficiency   . NICM (nonischemic cardiomyopathy) ([place])    a. Previous EF 45-50% in 2009 - echo in 2014 reported EF 50-55% but upon review felt to be lower - f/u echo 07/2016 with EF 40-45%. (no CAD by cath in 2010).  . Nocturia   . Presence of permanent cardiac pacemaker    Left chest  . RBBB    hx of  . S/P aortic valve replacement with bioprosthetic valve 05-10-2009   BY DR Ricard Dillon   CARDIOLOGIST-  DR Angelena Form    Past Surgical History:  Procedure Laterality Date  . APPENDECTOMY  AGE 22  . BIOLOGICAL BENTALL  AORTIC ROOT REPLACEMENT W/ PERICARDIAL TISSUE VALVE AND SYNTHETIC AORTIC GRAFT  05-10-2009  DR OWENS   BICUSPID AV W/ SEVERE AI &  ANEURYSM OF AORTIC ROOT AND PROXIMAL ASCENDING THORACIC AORTA  . CARDIAC CATHETERIZATION  08-10-2008  DR MCALHANY   NO EVIDENCE CAD/ MILD GLOBAL LVSF/ MODERATE AORTIC INSUFFICIENCY/ MILD DILATION OF THE AORTIC ROOT/ EF 45-50%  . CARDIAC CATHETERIZATION N/A 08/09/2016   Procedure: Left Heart Cath and Coronary Angiography;  Surgeon: Jettie Booze, MD;  Location: [facility];  Service: Cardiovascular;  Laterality: N/A;  . CATARACT EXTRACTION W/ INTRAOCULAR LENS IMPLANT Right 2015  . CYSTOSCOPY W/ RETROGRADES Bilateral 12/31/2012   Procedure: CYSTOSCOPY WITH RETROGRADE PYELOGRAM With CYSTOGRAM AND DEEP BLADDER BIOPSY ;  Surgeon: Molli Hazard, MD;  Location: [facility];  Service: Urology;  Laterality: Bilateral;  . CYSTOSCOPY W/ RETROGRADES Bilateral 10/12/2015   Procedure: BILATERAL RETROGRADE PYELOGRAM AND POST OP MITOMYCIN C;  Surgeon: Ardis Hughs, MD;  Location[facility];  Service: Urology;  Laterality: Bilateral;  . CYSTOSCOPY W/ RETROGRADES Bilateral 10/25/2016   Procedure: CYSTOSCOPY WITH RETROGRADE PYELOGRAM;  Surgeon: Ardis Hughs, MD;  Location: [facility];  Service: Urology;  Laterality: Bilateral;  . CYSTOSCOPY WITH BIOPSY N/A 02/22/2013   Procedure: CYSTOSCOPY WITH BIOPSY;  Surgeon: Molli Hazard, MD;  Location: [facility];  Service: Urology;  Laterality: N/A;  .  CYSTOSCOPY WITH BIOPSY N/A 08/02/2013   Procedure: CYSTOSCOPY WITH BLADDER BIOPSY AND  BILATERAL RETROGRADES;  Surgeon: Molli Hazard, MD;  Location[facility];  Service: Urology;  Laterality: N/A;  . EP IMPLANTABLE DEVICE N/A 08/09/2016   Procedure: BiV Pacemaker Insertion CRT-P;  Surgeon: Evans Lance, MD;  Location: [place] [facility];  Service: Cardiovascular;  Laterality: N/A;  . HYDROCELE  EXCISION / REPAIR  2009  . TRANSTHORACIC ECHOCARDIOGRAM  07-23-2013  DR MCALHANY   mild LVH,  ef 50-55%/   NORMAL FUNCTIONING BIOPROSTHETIC AORTIC VALVE (mean granient 21mmHg, peak gradient 47mmHg)/ 53mm prosthetic aortic root/  trivial PR/  mild RAE/  mild dilated RV  . TRANSURETHRAL RESECTION OF BLADDER TUMOR N/A 12/31/2012   Procedure: TRANSURETHRAL RESECTION OF BLADDER TUMOR (TURBT);  Surgeon: Molli Hazard, MD;  Location: [facility];  Service: Urology;  Laterality: N/A;  . TRANSURETHRAL RESECTION OF BLADDER TUMOR N/A 10/12/2015   Procedure: TRANSURETHRAL RESECTION OF BLADDER TUMOR (TURBT);  Surgeon: Ardis Hughs, MD;  Location: [facility];  Service: Urology;  Laterality: N/A;  . TRANSURETHRAL RESECTION OF BLADDER TUMOR Bilateral 05/21/2018   Procedure: CYSTOSCOPY, TRANSURETHRAL RESECTION OF BLADDER TUMOR (TURBT), BILATERAL RETROGRADE PYELOGRAMS;  Surgeon: Ardis Hughs, MD;  Location: [facility];  Service: Urology;  Laterality: Bilateral;  . TRANSURETHRAL RESECTION OF BLADDER TUMOR WITH MITOMYCIN-C N/A 10/25/2016   Procedure: TRANSURETHRAL RESECTION OF BLADDER TUMOR WITH MITOMYCIN-C;  Surgeon: Ardis Hughs, MD;  Location: [facility];  Service: Urology;  Laterality: N/A;  . TRANSURETHRAL RESECTION OF PROSTATE N/A 05/21/2018   Procedure: TRANSURETHRAL RESECTION OF THE PROSTATE (TURP);  Surgeon: Ardis Hughs, MD;  Location: [facility];  Service: Urology;  Laterality: N/A;    Family History  Problem Relation Age of Onset  . Mitral valve prolapse Father   . Brain cancer Father   . Throat cancer Father    Social History:  reports that he quit smoking about 11 years ago. His smoking use included cigarettes. He has a 50.00 pack-year smoking history. He has never used smokeless tobacco. He reports that he does not drink alcohol or use drugs.  Allergies: No Known Allergies  Medications Prior to Admission  Medication Sig Dispense Refill  . aspirin  EC 325 MG tablet Take 325 mg by mouth daily.    [name] BENZOYL PEROXIDE ER EX Apply 1 application topically daily as needed (issues).    [name] BIOTIN PO Take 1 tablet by mouth daily.    . carvedilol (COREG) 3.125 MG tablet TAKE ONE TABLET BY MOUTH TWICE A DAY (Patient taking differently: Take 3.125 mg by mouth 2 (two) times daily. ) 180 tablet 3  . Clotrimazole (JOCK ITCH EX) Apply 1 application topically daily as needed (itch).    . fluticasone (FLONASE) 50 MCG/ACT nasal spray Place 1 spray into both nostrils daily as needed for allergies or rhinitis.    [name] HYDROcodone-acetaminophen (NORCO/VICODIN) 5-325 MG tablet Take 1-2 tablets by mouth every 8 (eight) hours as needed for severe pain. (Patient taking differently: Take 1 tablet by mouth daily. ) 10 tablet 0  . ibuprofen (ADVIL) 200 MG tablet Take 400 mg by mouth every 6 (six) hours as needed for headache or moderate pain.    . Lidocaine-Glycerin (PREPARATION H EX) Apply 1 application topically daily as needed (rectal itching).    [name] lisinopril (ZESTRIL) 5 MG tablet Take 5 mg by mouth daily.    . Melatonin 10 MG CAPS Take 10 mg  by mouth at bedtime as needed (for sleep).     . meloxicam (MOBIC) 15 MG tablet Take 15 mg by mouth daily.     . mirabegron ER (MYRBETRIQ) 50 MG TB24 tablet Take 50 mg by mouth daily.     . Multiple Vitamin (MULTIVITAMIN) tablet Take 1 tablet by mouth 2 (two) times daily.     . Omega-3 Fatty Acids (FISH OIL ULTRA) 1400 MG CAPS Take 1,400 mg by mouth 2 (two) times daily.     Vladimir Faster Glycol-Propyl Glycol (SYSTANE OP) Place 1 drop into both eyes daily.     . sildenafil (VIAGRA) 100 MG tablet Take 100 mg by mouth daily as needed for erectile dysfunction.    . tamsulosin (FLOMAX) 0.4 MG CAPS capsule Take 0.4 mg by mouth at bedtime.     [name] zolpidem (AMBIEN) 10 MG tablet Take 5 mg by mouth See admin instructions. Take 5 mg by mouth at bedtime. May take an additional 5 mg by mouth as needed for sleep if the first dose doesn't fully work     . aspirin EC 81 MG tablet Take 1 tablet (81 mg total) by mouth daily. Resume 48 hours after surgery or when no bleeding in the urine or from the urethra. (Patient not taking: Reported on 09/14/2019)    . Neomycin-Bacitracin-Polymyxin (TRIPLE ANTIBIOTIC EX) Apply 1 application topically daily as needed (wound care).      Results for orders placed or performed during the hospital encounter of 09/20/19 (from the past 48 hour(s))  SARS CORONAVIRUS 2 (TAT 6-24 HRS) Nasopharyngeal Nasopharyngeal Swab     Status: None   Collection Time: 09/20/19 10:40 AM   Specimen: Nasopharyngeal Swab  Result Value Ref Range   SARS Coronavirus 2 NEGATIVE NEGATIVE    Comment: (NOTE) SARS-CoV-2 target nucleic acids are NOT DETECTED. The SARS-CoV-2 RNA is generally detectable in upper and lower respiratory specimens during the acute phase of infection. Negative results do not preclude SARS-CoV-2 infection, do not rule out co-infections with other pathogens, and should not be used as the sole basis for treatment or other patient management decisions. Negative results must be combined with clinical observations, patient history, and epidemiological information. The expected result is Negative. Fact Sheet for Patients: SugarRoll.be Fact Sheet for Healthcare Providers: https://www.woods-mathews.com/ This test is not yet approved or cleared by the [place] FDA and  has been authorized for detection and/or diagnosis of SARS-CoV-2 by FDA under an Emergency Use Authorization (EUA). This EUA will remain  in effect (meaning this test can be used) for the duration of the COVID-19 declaration under Section 56 4(b)(1) of the Act, 21 U.S.C. section 360bbb-3(b)(1), unless the authorization is terminated or revoked sooner. Performed at [facility], [place] [address] [place],  [address]    No results found.  Review of Systems  Musculoskeletal: Positive for back  pain.  Neurological: Positive for numbness.    Blood pressure (!) 134/93, pulse 66, temperature 98.5 F (36.9 C), resp. rate 18, height 6' (1.829 m), weight 86.4 kg, SpO2 100 %. Physical Exam  Constitutional: He is oriented to person, place, and time. He appears well-developed.  HENT:  Head: Normocephalic.  Eyes: Pupils are equal, round, and reactive to light.  GI: Soft.  Musculoskeletal:     Cervical back: Normal range of motion.  Neurological: He is alert and oriented to person, place, and time. He has normal strength. GCS eye subscore is 4. GCS verbal subscore is 5. GCS motor subscore is  6.   Strength is 5 out of 5 iliopsoas, quads, hamstrings, gastroc, into tibialis, and EHL.  Skin: Skin is warm and dry.     Assessment/Plan Six 34-year-old presents for decompression L1-S1  Zamora Colton P, MD 09/22/2019, 9:43 AM

## 2019-09-22 NOTE — Transfer of Care (Signed)
Immediate Anesthesia Transfer of Care Note  Patient: QUINCY GENNUSO  Procedure(s) Performed: Laminectomy and Foraminotomy - Lumbar one-two, Lumbar two-three, Lumbar three-four, Lumbar four-five, Lumba five -Sacral one (N/A Back)  Patient Location: PACU  Anesthesia Type:General  Level of Consciousness: awake, alert  and oriented  Airway & Oxygen Therapy: Patient Spontanous Breathing  Post-op Assessment: Report given to RN, Post -op Vital signs reviewed and stable and Patient moving all extremities X 4  Post vital signs: Reviewed and stable  Last Vitals:  Vitals Value Taken Time  BP 134/90 09/22/19 1310  Temp    Pulse 92 09/22/19 1311  Resp 20 09/22/19 1311  SpO2 100 % 09/22/19 1311  Vitals shown include unvalidated device data.  Last Pain:  Vitals:   09/22/19 0828  PainSc: 0-No pain      Patients Stated Pain Goal: 3 ([phone])  Complications: No apparent anesthesia complications

## 2019-09-22 NOTE — Anesthesia Procedure Notes (Signed)
Procedure Name: Intubation Date/Time: 09/22/2019 10:04 AM Performed by: Harden Mo, CRNA Pre-anesthesia Checklist: Patient identified, Emergency Drugs available, Suction available and Patient being monitored Patient Re-evaluated:Patient Re-evaluated prior to induction Oxygen Delivery Method: Circle System Utilized Preoxygenation: Pre-oxygenation with 100% oxygen Induction Type: IV induction and Rapid sequence Laryngoscope Size: Miller and 2 Grade View: Grade I Tube type: Oral Tube size: 7.5 mm Number of attempts: 1 Airway Equipment and Method: Stylet and Oral airway Placement Confirmation: ETT inserted through vocal cords under direct vision,  positive ETCO2 and breath sounds checked- equal and bilateral Secured at: 23 cm Tube secured with: Tape Dental Injury: Teeth and Oropharynx as per pre-operative assessment

## 2019-09-23 MED ORDER — CYCLOBENZAPRINE HCL 10 MG PO TABS: 10.0000 mg | ORAL_TABLET | Freq: Three times a day (TID) | ORAL | 1 refills | Status: DC | PRN

## 2019-09-23 NOTE — Evaluation (Signed)
Occupational Therapy Evaluation Patient Details Name: [name] MRN: [id number] DOB: [birth year] Today's Date: 09/23/2019    History of Present Illness Pt is a 64 y/o male s/p L1-5 laminectomy. PMH includes bladder cancer, HTN, and s/p AVR.    Clinical Impression   PTA patient independent. Admitted for above and limited by problem list below, including back precautions, pain, impaired balance, decreased activity tolerance. Requires supervision for transfers, min-mod assist for LB ADLs.  Patient educated on back precautions, ADL compensatory techniques, recommendations, safety and DME.  Recommend 3:1 commode to elevate low commode, shower stool for shower (initally daughter will be present and be able to assist with moving 3:1 commode to shower).  Patient has good support from daughter. Recommend continued OT services acutely to optimize independence and safety, but anticipate no further needs after dc home.     Follow Up Recommendations  No OT follow up;Supervision/Assistance - 24 hour    Equipment Recommendations  3 in 1 bedside commode    Recommendations for Other Services       Precautions / Restrictions Precautions Precautions: Back Precaution Booklet Issued: Yes (comment) Precaution Comments: Reviewed back precautions.  Required Braces or Orthoses: (no brace needed order) Restrictions Weight Bearing Restrictions: No      Mobility Bed Mobility               General bed mobility comments: sitting EOB upon entry   Transfers Overall transfer level: Needs assistance Equipment used: None Transfers: Sit to/from Stand Sit to Stand: Supervision         General transfer comment: close supervision for safety     Balance Overall balance assessment: Needs assistance Sitting-balance support: No upper extremity supported;Feet supported Sitting balance-Leahy Scale: Good     Standing balance support: No upper extremity supported;During functional  activity Standing balance-Leahy Scale: Fair Standing balance comment: patient reaching for UE support at times                           ADL either performed or assessed with clinical judgement   ADL Overall ADL's : Needs assistance/impaired     Grooming: Supervision/safety;Standing   Upper Body Bathing: Supervision/ safety;Set up;Sitting   Lower Body Bathing: Minimal assistance;Sit to/from stand Lower Body Bathing Details (indicate cue type and reason): requires assist to reach feet, educated on safety with bathing seated  Upper Body Dressing : Supervision/safety;Set up;Sitting   Lower Body Dressing: Moderate assistance;Sit to/from stand;Cueing for back precautions;Cueing for compensatory techniques Lower Body Dressing Details (indicate cue type and reason): patient unable to complete figure 4 technique, requires assist to thread pants and don shoes; patient will have support from daughter at [employer] Transfer: Supervision/safety;Ambulation Toilet Transfer Details (indicate cue type and reason): for safety, reviewed use of 3:1 over commode at home   Toileting - Clothing Manipulation Details (indicate cue type and reason): verbally reviewed and demonstrated use of bending at kness with UE support for hygiene    Tub/Shower Transfer Details (indicate cue type and reason): reviewed technique with UE support stepping over threshold  Functional mobility during ADLs: Supervision/safety General ADL Comments: patient limited by back pain, precautions, safety, and ADL compensatory techniques      Vision         Perception     Praxis      Pertinent Vitals/Pain Pain Assessment: Faces Faces Pain Scale: Hurts little more Pain Location: back Pain Descriptors / Indicators: Aching;Operative site guarding Pain Intervention(s): Monitored  during session;Repositioned;Limited activity within patient's tolerance     Hand Dominance     Extremity/Trunk Assessment Upper Extremity  Assessment Upper Extremity Assessment: Overall WFL for tasks assessed   Lower Extremity Assessment Lower Extremity Assessment: Defer to PT evaluation   Cervical / Trunk Assessment Cervical / Trunk Assessment: Other exceptions Cervical / Trunk Exceptions: s/p lumbar surgery   Communication Communication Communication: No difficulties   Cognition Arousal/Alertness: Awake/alert Behavior During Therapy: WFL for tasks assessed/performed Overall Cognitive Status: Within Functional Limits for tasks assessed                                 General Comments: some decreased safety awareness   General Comments  daughter present and supportive, educated on use of 3:1 over commode for toilet transfers and moving it to the shower for bathing; recommended purchasing stool for shower once patient is alone and I do not recommend patient moving 3:1 between toilet and shower without assist- daugther agreeable. Noted 1 LOB exiting bathroom with patient using wall to support self with min guard for safeyt     Exercises     Shoulder Instructions      Home Living Family/patient expects to be discharged to:: Private residence Living Arrangements: Alone Available Help at Discharge: Family;Available 24 hours/day Type of Home: House Home Access: Level entry     Home Layout: One level     Bathroom Shower/Tub: Occupational psychologist: Standard     Home Equipment: Environmental consultant - 2 wheels   Additional Comments: daughter will be assisting 24/7; pt reports using step stool as shower chair as needed      Prior Functioning/Environment Level of Independence: Independent                 OT Problem List: Decreased activity tolerance;Impaired balance (sitting and/or standing);Decreased safety awareness;Decreased knowledge of use of DME or AE;Decreased knowledge of precautions;Pain      OT Treatment/Interventions: Self-care/ADL training;DME and/or AE instruction;Therapeutic  activities;Patient/family education;Balance training;Energy conservation    OT Goals(Current goals can be found in the care plan section) Acute Rehab OT Goals Patient Stated Goal: to go home OT Goal Formulation: With patient Time For Goal Achievement: 10/07/19 Potential to Achieve Goals: Good  OT Frequency: Min 2X/week   Barriers to D/C:            Co-evaluation              AM-PAC OT "6 Clicks" Daily Activity     Outcome Measure Help from another person eating meals?: None Help from another person taking care of personal grooming?: A Little Help from another person toileting, which includes using toliet, bedpan, or urinal?: A Little Help from another person bathing (including washing, rinsing, drying)?: A Little Help from another person to put on and taking off regular upper body clothing?: None Help from another person to put on and taking off regular lower body clothing?: A Lot 6 Click Score: 19   End of Session Nurse Communication: Mobility status  Activity Tolerance: Patient tolerated treatment well Patient left: with call bell/phone within reach;with family/visitor present(seated EOB )  OT Visit Diagnosis: Other abnormalities of gait and mobility (R26.89);Pain Pain - part of body: (back)                Time: [id number] OT Time Calculation (min): 13 min Charges:  OT General Charges $OT Visit: 1 Visit OT Evaluation $OT Eval  Low Complexity: 1 Low  Jolaine Artist, OT Acute Rehabilitation Services Pager [phone] Office [phone]   Delight Stare 09/23/2019, 8:58 AM

## 2019-09-23 NOTE — Discharge Summary (Signed)
Physician Discharge Summary  Patient ID: [name] MRN: [id number] DOB/AGE: [birth year] 64 y.o.  Admit date: 09/22/2019 Discharge date: 09/23/2019  Admission Diagnoses: Severe lumbar spinal stenosis with bilateral L2-L5 radiculopathies and right S1 radiculopathy from herniated nucleus pulposus L5-S1 right    Discharge Diagnoses: same   Discharged Condition: good  Hospital Course: The patient was admitted on 09/22/2019 and taken to the operating room where the patient underwent decompressive lumbar laminectomy L1-2, 2-3, 3-4, 4-5, with a microdiscectomy on the right at L5-S1. The patient tolerated the procedure well and was taken to the recovery room and then to the floor in stable condition. The hospital course was routine. There were no complications. The wound remained clean dry and intact. Pt had appropriate back soreness. No complaints of leg pain or new N/T/W. The patient remained afebrile with stable vital signs, and tolerated a regular diet. The patient continued to increase activities, and pain was well controlled with oral pain medications.   Consults: None  Significant Diagnostic Studies:  Results for orders placed or performed during the hospital encounter of 09/22/19  Type and screen  Result Value Ref Range   ABO/RH(D) A POS    Antibody Screen NEG    Sample Expiration      09/25/2019,2359 Performed at [facility], [place] [address], [place], [place] [address]     CT LUMBAR SPINE W CONTRAST  Result Date: 08/31/2019 CLINICAL DATA:  Chronic bilateral posterior thigh and calf pain, numbness, and tingling. No prior surgery. EXAM: LUMBAR MYELOGRAM CT LUMBAR MYELOGRAM FLUOROSCOPY TIME:  Radiation Exposure Index (as provided by the fluoroscopic device): 28.3 mGy Fluoroscopy Time:  2 minutes, 5 seconds Number of Acquired Images:  17 PROCEDURE: After thorough discussion of risks and benefits of the procedure including bleeding, infection, injury to nerves, blood vessels,  adjacent structures as well as headache and CSF leak, written and oral informed consent was obtained. Consent was obtained by Dr. Fabiola Backer. Time out form was completed. Patient was positioned prone on the fluoroscopy table. Local anesthesia was provided with 1% lidocaine without epinephrine after prepped and draped in the usual sterile fashion. Puncture was performed at L3-L4 using a 3 1/2 inch 22-gauge spinal needle via right interlaminar approach. Using a single pass through the dura, the needle was placed within the thecal sac, with return of clear CSF. 15 mL of Isovue M-200 was injected into the thecal sac, with normal opacification of the nerve roots and cauda equina consistent with free flow within the subarachnoid space. I personally performed the lumbar puncture and administered the intrathecal contrast. I also personally supervised acquisition of the myelogram images. TECHNIQUE: Contiguous axial images were obtained through the lumbar spine after the intrathecal infusion of contrast. Coronal and sagittal reconstructions were obtained of the axial image sets. COMPARISON:  CT abdomen pelvis dated September 25, 2016. FINDINGS: LUMBAR MYELOGRAM FINDINGS: Mild dextroscoliosis. Sagittal alignment is maintained. No dynamic instability. Ventral extradural defects from T12-L1 through L4-L5. Severe spinal canal stenosis at L4-L5 with complete contrast block. Moderate spinal canal stenosis at L1-L2 and L2-L3. Mild-to-moderate spinal canal stenosis at L3-L4. Underfilling of the left L3 and bilateral L4 nerve roots. CT LUMBAR MYELOGRAM FINDINGS: Segmentation: Standard. Alignment: Mild dextroscoliosis.  Trace retrolisthesis at L2-L3. Vertebrae: No acute fracture or other focal pathologic process. Chronic moderate to severe L1 compression deformity. Conus medullaris and cauda equina: Conus extends to the L1 level. Conus appears normal. Clumping of the cauda equina nerve roots due to downstream stenosis. Paraspinal and  other soft tissues: Aortoiliac atherosclerotic vascular disease. Disc levels: T12-L1: Mild disc bulging and right facet arthropathy. No stenosis. L1-L2: Moderate diffuse disc bulging and endplate spurring eccentric to the right. Right subarticular disc osteophyte complex. Moderate spinal canal and right lateral recess stenosis. Moderate bilateral neuroforaminal stenosis. L2-L3: Moderate circumferential disc osteophyte complex. Moderate spinal canal and left greater than right recess stenosis. Mild bilateral neuroforaminal stenosis. L3-L4: Small circumferential disc osteophyte complex eccentric to left. Mild left facet arthropathy. Mild-to-moderate spinal canal stenosis. Moderate left lateral recess stenosis. Moderate left neuroforaminal stenosis. No right neuroforaminal stenosis. L4-L5: Moderate circumferential disc osteophyte complex eccentric to the right. Mild bilateral facet arthropathy. Severe spinal canal stenosis. Severe right and moderate left neuroforaminal stenosis. L5-S1: Mild diffuse disc bulging with superimposed large right subarticular disc protrusion. Mild spinal canal stenosis. Severe right lateral recess stenosis. Moderate bilateral neuroforaminal stenosis. IMPRESSION: 1. Advanced multilevel lumbar spondylosis as described above. Severe spinal canal and right neuroforaminal stenosis at L4-L5. 2. Moderate stenosis at L1-L2 and L2-L3. 3. Large right subarticular disc protrusion at L5-S1 with severe right lateral recess stenosis. 4. Mild-to-moderate spinal canal stenosis at L3-L4 with moderate left lateral recess and neuroforaminal stenosis. 5. Chronic moderate to severe L1 compression deformity. Electronically Signed   By: Titus Dubin M.D.   On: 08/31/2019 12:00   DG Lumbar Spine 1 View  Result Date: 09/22/2019 CLINICAL DATA:  Lumbar spine localization. EXAM: LUMBAR SPINE - 1 VIEW COMPARISON:  CT lumbar spine 08/31/2019 and lumbar spine series 01/17/2016. FINDINGS: Numbering system utilized  on 08/31/2019 is preserved. A surgical instrument tip projects posterior to L3. L1 compression fracture, as before. Multilevel endplate degenerative changes and loss of disc space height throughout the lumbar spine. IMPRESSION: Intraoperative localization at L3. Electronically Signed   By: Lorin Picket M.D.   On: 09/22/2019 11:01   DG MYELOGRAPHY LUMBAR INJ LUMBOSACRAL  Result Date: 08/31/2019 CLINICAL DATA:  Chronic bilateral posterior thigh and calf pain, numbness, and tingling. No prior surgery. EXAM: LUMBAR MYELOGRAM CT LUMBAR MYELOGRAM FLUOROSCOPY TIME:  Radiation Exposure Index (as provided by the fluoroscopic device): 28.3 mGy Fluoroscopy Time:  2 minutes, 5 seconds Number of Acquired Images:  17 PROCEDURE: After thorough discussion of risks and benefits of the procedure including bleeding, infection, injury to nerves, blood vessels, adjacent structures as well as headache and CSF leak, written and oral informed consent was obtained. Consent was obtained by Dr. Fabiola Backer. Time out form was completed. Patient was positioned prone on the fluoroscopy table. Local anesthesia was provided with 1% lidocaine without epinephrine after prepped and draped in the usual sterile fashion. Puncture was performed at L3-L4 using a 3 1/2 inch 22-gauge spinal needle via right interlaminar approach. Using a single pass through the dura, the needle was placed within the thecal sac, with return of clear CSF. 15 mL of Isovue M-200 was injected into the thecal sac, with normal opacification of the nerve roots and cauda equina consistent with free flow within the subarachnoid space. I personally performed the lumbar puncture and administered the intrathecal contrast. I also personally supervised acquisition of the myelogram images. TECHNIQUE: Contiguous axial images were obtained through the lumbar spine after the intrathecal infusion of contrast. Coronal and sagittal reconstructions were obtained of the axial image sets.  COMPARISON:  CT abdomen pelvis dated September 25, 2016. FINDINGS: LUMBAR MYELOGRAM FINDINGS: Mild dextroscoliosis. Sagittal alignment is maintained. No dynamic instability. Ventral extradural defects from T12-L1 through L4-L5. Severe spinal canal stenosis at L4-L5 with complete contrast block. Moderate  spinal canal stenosis at L1-L2 and L2-L3. Mild-to-moderate spinal canal stenosis at L3-L4. Underfilling of the left L3 and bilateral L4 nerve roots. CT LUMBAR MYELOGRAM FINDINGS: Segmentation: Standard. Alignment: Mild dextroscoliosis.  Trace retrolisthesis at L2-L3. Vertebrae: No acute fracture or other focal pathologic process. Chronic moderate to severe L1 compression deformity. Conus medullaris and cauda equina: Conus extends to the L1 level. Conus appears normal. Clumping of the cauda equina nerve roots due to downstream stenosis. Paraspinal and other soft tissues: Aortoiliac atherosclerotic vascular disease. Disc levels: T12-L1: Mild disc bulging and right facet arthropathy. No stenosis. L1-L2: Moderate diffuse disc bulging and endplate spurring eccentric to the right. Right subarticular disc osteophyte complex. Moderate spinal canal and right lateral recess stenosis. Moderate bilateral neuroforaminal stenosis. L2-L3: Moderate circumferential disc osteophyte complex. Moderate spinal canal and left greater than right recess stenosis. Mild bilateral neuroforaminal stenosis. L3-L4: Small circumferential disc osteophyte complex eccentric to left. Mild left facet arthropathy. Mild-to-moderate spinal canal stenosis. Moderate left lateral recess stenosis. Moderate left neuroforaminal stenosis. No right neuroforaminal stenosis. L4-L5: Moderate circumferential disc osteophyte complex eccentric to the right. Mild bilateral facet arthropathy. Severe spinal canal stenosis. Severe right and moderate left neuroforaminal stenosis. L5-S1: Mild diffuse disc bulging with superimposed large right subarticular disc protrusion. Mild  spinal canal stenosis. Severe right lateral recess stenosis. Moderate bilateral neuroforaminal stenosis. IMPRESSION: 1. Advanced multilevel lumbar spondylosis as described above. Severe spinal canal and right neuroforaminal stenosis at L4-L5. 2. Moderate stenosis at L1-L2 and L2-L3. 3. Large right subarticular disc protrusion at L5-S1 with severe right lateral recess stenosis. 4. Mild-to-moderate spinal canal stenosis at L3-L4 with moderate left lateral recess and neuroforaminal stenosis. 5. Chronic moderate to severe L1 compression deformity. Electronically Signed   By: Titus Dubin M.D.   On: 08/31/2019 12:00    Antibiotics:  Anti-infectives (From admission, onward)   Start     Dose/Rate Route Frequency Ordered Stop   09/22/19 1800  ceFAZolin (ANCEF) IVPB 2g/100 mL premix     2 g 200 mL/hr over 30 Minutes Intravenous Every 8 hours 09/22/19 1408 09/23/19 0140   09/22/19 0934  bacitracin 50,000 Units in sodium chloride 0.9 % 500 mL irrigation  Status:  Discontinued       As needed 09/22/19 0934 09/22/19 1306      Discharge Exam: Blood pressure 120/85, pulse 75, temperature 98.4 F (36.9 C), temperature source Oral, resp. rate 18, height 6' (1.829 m), weight 86.4 kg, SpO2 100 %. Neurologic: Grossly normal Ambulating and voiding well, incision cdi  Discharge Medications:   Allergies as of 09/23/2019   No Known Allergies     Medication List    TAKE these medications   aspirin EC 325 MG tablet Take 325 mg by mouth daily.   aspirin EC 81 MG tablet Take 1 tablet (81 mg total) by mouth daily. Resume 48 hours after surgery or when no bleeding in the urine or from the urethra.   BENZOYL PEROXIDE ER EX Apply 1 application topically daily as needed (issues).   BIOTIN PO Take 1 tablet by mouth daily.   carvedilol 3.125 MG tablet Commonly known as: COREG TAKE ONE TABLET BY MOUTH TWICE A DAY   cyclobenzaprine 10 MG tablet Commonly known as: FLEXERIL Take 1 tablet (10 mg total) by  mouth 3 (three) times daily as needed for muscle spasms.   Fish Oil Ultra 1400 MG Caps Take 1,400 mg by mouth 2 (two) times daily.   fluticasone 50 MCG/ACT nasal spray Commonly known as: Strawn  1 spray into both nostrils daily as needed for allergies or rhinitis.   HYDROcodone-acetaminophen 5-325 MG tablet Commonly known as: NORCO/VICODIN Take 1-2 tablets by mouth every 8 (eight) hours as needed for severe pain. What changed:   how much to take  when to take this   ibuprofen 200 MG tablet Commonly known as: ADVIL Take 400 mg by mouth every 6 (six) hours as needed for headache or moderate pain.   JOCK ITCH EX Apply 1 application topically daily as needed (itch).   lisinopril 5 MG tablet Commonly known as: ZESTRIL Take 5 mg by mouth daily.   Melatonin 10 MG Caps Take 10 mg by mouth at bedtime as needed (for sleep).   meloxicam 15 MG tablet Commonly known as: MOBIC Take 15 mg by mouth daily.   multivitamin tablet Take 1 tablet by mouth 2 (two) times daily.   Myrbetriq 50 MG Tb24 tablet Generic drug: mirabegron ER Take 50 mg by mouth daily.   PREPARATION H EX Apply 1 application topically daily as needed (rectal itching).   sildenafil 100 MG tablet Commonly known as: VIAGRA Take 100 mg by mouth daily as needed for erectile dysfunction.   SYSTANE OP Place 1 drop into both eyes daily.   tamsulosin 0.4 MG Caps capsule Commonly known as: FLOMAX Take 0.4 mg by mouth at bedtime.   TRIPLE ANTIBIOTIC EX Apply 1 application topically daily as needed (wound care).   zolpidem 10 MG tablet Commonly known as: AMBIEN Take 5 mg by mouth See admin instructions. Take 5 mg by mouth at bedtime. May take an additional 5 mg by mouth as needed for sleep if the first dose doesn't fully work       Disposition: home   Final Dx: Lumbar laminectomy L1-L5, microdiscectomy L5-S1  Discharge Instructions    Call MD for:  difficulty breathing, headache or visual  disturbances   Complete by: As directed    Call MD for:  persistant nausea and vomiting   Complete by: As directed    Call MD for:  redness, tenderness, or signs of infection (pain, swelling, redness, odor or green/yellow discharge around incision site)   Complete by: As directed    Call MD for:  severe uncontrolled pain   Complete by: As directed    Call MD for:  temperature >100.4   Complete by: As directed    Diet - low sodium heart healthy   Complete by: As directed    Discharge instructions   Complete by: As directed    May shower, no heavy lifting or strenuous activity, avoid bending and twisting   Increase activity slowly   Complete by: As directed    Remove dressing in 48 hours   Complete by: As directed       Follow-up Information    Kary Kos, MD. Schedule an appointment as soon as possible for a visit in 2 week(s).   Specialty: Neurosurgery Contact information: [address] [place] [address] [place] [address] [phone]            Signed: Ocie Cornfield [facility] 09/23/2019, 7:26 AM

## 2019-09-23 NOTE — Anesthesia Postprocedure Evaluation (Signed)
Anesthesia Post Note  Patient: [name]  Procedure(s) Performed: Laminectomy and Foraminotomy - Lumbar one-two, Lumbar two-three, Lumbar three-four, Lumbar four-five, Lumba five -Sacral one (N/A Back)     Patient location during evaluation: PACU Anesthesia Type: General Level of consciousness: awake and alert Pain management: pain level controlled Vital Signs Assessment: post-procedure vital signs reviewed and stable Respiratory status: spontaneous breathing, nonlabored ventilation, respiratory function stable and patient connected to nasal cannula oxygen Cardiovascular status: blood pressure returned to baseline and stable Postop Assessment: no apparent nausea or vomiting Anesthetic complications: no    Last Vitals:  Vitals:   09/23/19 0518 09/23/19 0730  BP: 120/85 111/65  Pulse: 75 77  Resp: 18 18  Temp: 36.9 C 37.2 C  SpO2: 100% 93%    Last Pain:  Vitals:   09/23/19 0800  TempSrc:   PainSc: 3                  Tiajuana Amass

## 2019-09-23 NOTE — Discharge Instructions (Signed)

## 2019-09-23 NOTE — Progress Notes (Signed)
Physical Therapy Treatment Patient Details Name: [name] MRN: [id number] DOB: [birth year] Today's Date: 09/23/2019    History of Present Illness Pt is a 64 y/o male s/p L1-5 laminectomy. PMH includes bladder cancer, HTN, and s/p AVR.     PT Comments    Pt progressing well with post-op mobility. He was able to demonstrate transfers and ambulation with gross supervision for safety and no AD. Gait pattern likely near baseline and appears guarded with wide BOS and excessive arm swing for momentum. Pt was educated on precautions, positioning recommendations, appropriate activity progression, and car transfer. Will continue to follow.     Follow Up Recommendations  No PT follow up     Equipment Recommendations  None recommended by PT    Recommendations for Other Services       Precautions / Restrictions Precautions Precautions: Back Precaution Booklet Issued: Yes (comment) Precaution Comments: Reviewed back precautions.  Required Braces or Orthoses: (no brace needed order) Restrictions Weight Bearing Restrictions: No    Mobility  Bed Mobility               General bed mobility comments: Pt sitting up EOB when PT arrived.   Transfers Overall transfer level: Needs assistance Equipment used: None Transfers: Sit to/from Stand Sit to Stand: Supervision         General transfer comment: Supervision for safety as pt mildly impulsive  Ambulation/Gait Ambulation/Gait assistance: Supervision Gait Distance (Feet): 300 Feet Assistive device: None Gait Pattern/deviations: Step-through pattern;Decreased stride length;Wide base of support Gait velocity: Decreased Gait velocity interpretation: <1.31 ft/sec, indicative of household ambulator General Gait Details: Noted bilaterally flexed knees throughout gait cycle. Pt appears guarded and with decreased gait speed.    Stairs             Wheelchair Mobility    Modified Rankin (Stroke Patients Only)       Balance Overall balance assessment: Needs assistance Sitting-balance support: No upper extremity supported;Feet supported Sitting balance-Leahy Scale: Good     Standing balance support: No upper extremity supported;During functional activity Standing balance-Leahy Scale: Fair Standing balance comment: patient reaching for UE support at times                            Cognition Arousal/Alertness: Awake/alert Behavior During Therapy: Impulsive(at times) Overall Cognitive Status: Within Functional Limits for tasks assessed                                 General Comments: some decreased safety awareness      Exercises      General Comments General comments (skin integrity, edema, etc.): daughter present and supportive, educated on use of 3:1 over commode for toilet transfers and moving it to the shower for bathing; recommended purchasing stool for shower once patient is alone and I do not recommend patient moving 3:1 between toilet and shower without assist- daugther agreeable. Noted 1 LOB exiting bathroom with patient using wall to support self with min guard for safeyt       Pertinent Vitals/Pain Pain Assessment: Faces Faces Pain Scale: Hurts a little bit Pain Location: back Pain Descriptors / Indicators: Aching;Operative site guarding Pain Intervention(s): Limited activity within patient's tolerance;Monitored during session;Repositioned    Home Living Family/patient expects to be discharged to:: Private residence Living Arrangements: Alone Available Help at Discharge: Family;Available 24 hours/day Type of Home: House Home Access: Level  entry   Home Layout: One level Home Equipment: [place] - 2 wheels Additional Comments: daughter will be assisting 24/7; pt reports using step stool as shower chair as needed    Prior Function Level of Independence: Independent          PT Goals (current goals can now be found in the care plan section) Acute  Rehab PT Goals Patient Stated Goal: to go home PT Goal Formulation: With patient Time For Goal Achievement: 10/06/19 Potential to Achieve Goals: Good Progress towards PT goals: Progressing toward goals    Frequency    Min 5X/week      PT Plan Current plan remains appropriate    Co-evaluation              AM-PAC PT "6 Clicks" Mobility   Outcome Measure  Help needed turning from your back to your side while in a flat bed without using bedrails?: None Help needed moving from lying on your back to sitting on the side of a flat bed without using bedrails?: A Little Help needed moving to and from a bed to a chair (including a wheelchair)?: A Little Help needed standing up from a chair using your arms (e.g., wheelchair or bedside chair)?: A Little Help needed to walk in hospital room?: A Little Help needed climbing 3-5 steps with a railing? : A Little 6 Click Score: 19    End of Session Equipment Utilized During Treatment: Gait belt Activity Tolerance: Patient tolerated treatment well Patient left: with call bell/phone within reach;Other (comment)(Sitting EOB) Nurse Communication: Mobility status PT Visit Diagnosis: Other abnormalities of gait and mobility (R26.89);Pain Pain - part of body: (back)     Time: [id number] PT Time Calculation (min) (ACUTE ONLY): 21 min  Charges:  $Gait Training: 8-22 mins                     Rolinda Roan, PT, DPT Acute Rehabilitation Services Pager: [phone] Office: [phone]    Thelma Comp 09/23/2019, 10:15 AM

## 2019-09-23 NOTE — Progress Notes (Signed)
Patient is discharged from room 3C03 at this time. Alert and in stable condition. IV site d/c'd as well as hemovac and instructions read to patient and daughter with understanding verbalized. Left unit via wheelchair with all belongings at side.

## 2019-10-08 DIAGNOSIS — H35363 Drusen (degenerative) of macula, bilateral: Secondary | ICD-10-CM | POA: Diagnosis not present

## 2019-10-08 DIAGNOSIS — H35033 Hypertensive retinopathy, bilateral: Secondary | ICD-10-CM | POA: Diagnosis not present

## 2019-10-08 DIAGNOSIS — H35373 Puckering of macula, bilateral: Secondary | ICD-10-CM | POA: Diagnosis not present

## 2019-10-08 DIAGNOSIS — H524 Presbyopia: Secondary | ICD-10-CM | POA: Diagnosis not present

## 2019-11-17 ENCOUNTER — Ambulatory Visit (INDEPENDENT_AMBULATORY_CARE_PROVIDER_SITE_OTHER): Payer: PPO | Admitting: *Deleted

## 2019-11-17 ENCOUNTER — Telehealth: Payer: Self-pay

## 2019-11-17 DIAGNOSIS — I441 Atrioventricular block, second degree: Secondary | ICD-10-CM | POA: Diagnosis not present

## 2019-11-17 DIAGNOSIS — I428 Other cardiomyopathies: Secondary | ICD-10-CM | POA: Diagnosis not present

## 2019-11-17 DIAGNOSIS — Z4502 Encounter for adjustment and management of automatic implantable cardiac defibrillator: Secondary | ICD-10-CM | POA: Diagnosis not present

## 2019-11-17 LAB — CUP PACEART REMOTE DEVICE CHECK
Battery Remaining Longevity: 104 mo
Battery Voltage: 2.98 V
Brady Statistic AP VP Percent: 0.03 %
Brady Statistic AP VS Percent: 0.01 %
Brady Statistic AS VP Percent: 98.37 %
Brady Statistic AS VS Percent: 1.59 %
Brady Statistic RA Percent Paced: 0.04 %
Brady Statistic RV Percent Paced: 64.71 %
Date Time Interrogation Session: 20210413193113
Implantable Lead Implant Date: 20180105
Implantable Lead Implant Date: 20180105
Implantable Lead Implant Date: 20180105
Implantable Lead Location: 753858
Implantable Lead Location: 753859
Implantable Lead Location: 753860
Implantable Lead Model: 4398
Implantable Lead Model: 5076
Implantable Lead Model: 5076
Implantable Pulse Generator Implant Date: 20180105
Lead Channel Impedance Value: 304 Ohm
Lead Channel Impedance Value: 323 Ohm
Lead Channel Impedance Value: 380 Ohm
Lead Channel Impedance Value: 399 Ohm
Lead Channel Impedance Value: 418 Ohm
Lead Channel Impedance Value: 456 Ohm
Lead Channel Impedance Value: 494 Ohm
Lead Channel Impedance Value: 494 Ohm
Lead Channel Impedance Value: 570 Ohm
Lead Channel Impedance Value: 589 Ohm
Lead Channel Impedance Value: 608 Ohm
Lead Channel Impedance Value: 703 Ohm
Lead Channel Impedance Value: 779 Ohm
Lead Channel Impedance Value: 779 Ohm
Lead Channel Pacing Threshold Amplitude: 0.5 V
Lead Channel Pacing Threshold Amplitude: 0.625 V
Lead Channel Pacing Threshold Amplitude: 0.625 V
Lead Channel Pacing Threshold Pulse Width: 0.4 ms
Lead Channel Pacing Threshold Pulse Width: 0.4 ms
Lead Channel Pacing Threshold Pulse Width: 0.8 ms
Lead Channel Sensing Intrinsic Amplitude: 2.5 mV
Lead Channel Sensing Intrinsic Amplitude: 2.5 mV
Lead Channel Sensing Intrinsic Amplitude: 6.875 mV
Lead Channel Sensing Intrinsic Amplitude: 6.875 mV
Lead Channel Setting Pacing Amplitude: 1.25 V
Lead Channel Setting Pacing Amplitude: 2 V
Lead Channel Setting Pacing Amplitude: 2.5 V
Lead Channel Setting Pacing Pulse Width: 0.4 ms
Lead Channel Setting Pacing Pulse Width: 0.8 ms
Lead Channel Setting Sensing Sensitivity: 2 mV

## 2019-11-17 NOTE — Progress Notes (Signed)
PPM Remote  

## 2019-11-17 NOTE — Telephone Encounter (Signed)
LMOVM for pt to return my call. I need to know if he wants to be transferred to [facility].

## 2019-11-24 ENCOUNTER — Telehealth: Payer: Self-pay

## 2019-11-24 NOTE — Telephone Encounter (Signed)
Patient called back and confirmed that he wants to be released in carelink; f/u appts canceled and I in pace art

## 2019-11-24 NOTE — Telephone Encounter (Signed)
LVM  On patients phone to return my call about being released in care link; Letter will be sent today as this is the second attempt.

## 2019-12-29 DIAGNOSIS — F5101 Primary insomnia: Secondary | ICD-10-CM | POA: Diagnosis not present

## 2019-12-29 DIAGNOSIS — I10 Essential (primary) hypertension: Secondary | ICD-10-CM | POA: Diagnosis not present

## 2019-12-29 DIAGNOSIS — M545 Low back pain: Secondary | ICD-10-CM | POA: Diagnosis not present

## 2019-12-29 DIAGNOSIS — G8929 Other chronic pain: Secondary | ICD-10-CM | POA: Diagnosis not present

## 2019-12-29 DIAGNOSIS — Z79899 Other long term (current) drug therapy: Secondary | ICD-10-CM | POA: Diagnosis not present

## 2020-02-16 DIAGNOSIS — Z45018 Encounter for adjustment and management of other part of cardiac pacemaker: Secondary | ICD-10-CM | POA: Diagnosis not present

## 2020-02-22 DIAGNOSIS — M48062 Spinal stenosis, lumbar region with neurogenic claudication: Secondary | ICD-10-CM | POA: Diagnosis not present

## 2020-02-23 DIAGNOSIS — C679 Malignant neoplasm of bladder, unspecified: Secondary | ICD-10-CM | POA: Diagnosis not present

## 2020-02-23 DIAGNOSIS — Z953 Presence of xenogenic heart valve: Secondary | ICD-10-CM | POA: Diagnosis not present

## 2020-02-23 DIAGNOSIS — I441 Atrioventricular block, second degree: Secondary | ICD-10-CM | POA: Diagnosis not present

## 2020-02-23 DIAGNOSIS — M545 Low back pain: Secondary | ICD-10-CM | POA: Diagnosis not present

## 2020-02-23 DIAGNOSIS — R739 Hyperglycemia, unspecified: Secondary | ICD-10-CM | POA: Diagnosis not present

## 2020-02-23 DIAGNOSIS — G4700 Insomnia, unspecified: Secondary | ICD-10-CM | POA: Diagnosis not present

## 2020-02-23 DIAGNOSIS — I428 Other cardiomyopathies: Secondary | ICD-10-CM | POA: Diagnosis not present

## 2020-02-23 DIAGNOSIS — K429 Umbilical hernia without obstruction or gangrene: Secondary | ICD-10-CM | POA: Diagnosis not present

## 2020-02-23 DIAGNOSIS — I110 Hypertensive heart disease with heart failure: Secondary | ICD-10-CM | POA: Diagnosis not present

## 2020-02-23 DIAGNOSIS — K760 Fatty (change of) liver, not elsewhere classified: Secondary | ICD-10-CM | POA: Diagnosis not present

## 2020-02-23 DIAGNOSIS — N529 Male erectile dysfunction, unspecified: Secondary | ICD-10-CM | POA: Diagnosis not present

## 2020-02-23 DIAGNOSIS — Z45018 Encounter for adjustment and management of other part of cardiac pacemaker: Secondary | ICD-10-CM | POA: Diagnosis not present

## 2020-03-14 DIAGNOSIS — Z20822 Contact with and (suspected) exposure to covid-19: Secondary | ICD-10-CM | POA: Diagnosis not present

## 2020-04-17 DIAGNOSIS — R8279 Other abnormal findings on microbiological examination of urine: Secondary | ICD-10-CM | POA: Diagnosis not present

## 2020-04-17 DIAGNOSIS — R300 Dysuria: Secondary | ICD-10-CM | POA: Diagnosis not present

## 2020-04-17 DIAGNOSIS — C670 Malignant neoplasm of trigone of bladder: Secondary | ICD-10-CM | POA: Diagnosis not present

## 2020-04-25 DIAGNOSIS — C670 Malignant neoplasm of trigone of bladder: Secondary | ICD-10-CM | POA: Diagnosis not present

## 2020-04-25 DIAGNOSIS — R300 Dysuria: Secondary | ICD-10-CM | POA: Diagnosis not present

## 2020-04-25 DIAGNOSIS — N410 Acute prostatitis: Secondary | ICD-10-CM | POA: Diagnosis not present

## 2020-05-16 DIAGNOSIS — M62838 Other muscle spasm: Secondary | ICD-10-CM | POA: Diagnosis not present

## 2020-05-16 DIAGNOSIS — R350 Frequency of micturition: Secondary | ICD-10-CM | POA: Diagnosis not present

## 2020-05-16 DIAGNOSIS — R102 Pelvic and perineal pain: Secondary | ICD-10-CM | POA: Diagnosis not present

## 2020-05-16 DIAGNOSIS — M6289 Other specified disorders of muscle: Secondary | ICD-10-CM | POA: Diagnosis not present

## 2020-05-17 DIAGNOSIS — Z950 Presence of cardiac pacemaker: Secondary | ICD-10-CM | POA: Diagnosis not present

## 2020-05-30 DIAGNOSIS — M6281 Muscle weakness (generalized): Secondary | ICD-10-CM | POA: Diagnosis not present

## 2020-05-30 DIAGNOSIS — M62838 Other muscle spasm: Secondary | ICD-10-CM | POA: Diagnosis not present

## 2020-05-30 DIAGNOSIS — M6289 Other specified disorders of muscle: Secondary | ICD-10-CM | POA: Diagnosis not present

## 2020-05-30 DIAGNOSIS — R350 Frequency of micturition: Secondary | ICD-10-CM | POA: Diagnosis not present

## 2020-05-30 DIAGNOSIS — R102 Pelvic and perineal pain: Secondary | ICD-10-CM | POA: Diagnosis not present

## 2020-06-06 DIAGNOSIS — I5022 Chronic systolic (congestive) heart failure: Secondary | ICD-10-CM | POA: Diagnosis not present

## 2020-06-06 DIAGNOSIS — I110 Hypertensive heart disease with heart failure: Secondary | ICD-10-CM | POA: Diagnosis not present

## 2020-06-06 DIAGNOSIS — Z45018 Encounter for adjustment and management of other part of cardiac pacemaker: Secondary | ICD-10-CM | POA: Diagnosis not present

## 2020-06-06 DIAGNOSIS — N529 Male erectile dysfunction, unspecified: Secondary | ICD-10-CM | POA: Diagnosis not present

## 2020-06-06 DIAGNOSIS — G4700 Insomnia, unspecified: Secondary | ICD-10-CM | POA: Diagnosis not present

## 2020-06-06 DIAGNOSIS — M545 Low back pain, unspecified: Secondary | ICD-10-CM | POA: Diagnosis not present

## 2020-06-06 DIAGNOSIS — K760 Fatty (change of) liver, not elsewhere classified: Secondary | ICD-10-CM | POA: Diagnosis not present

## 2020-06-06 DIAGNOSIS — R739 Hyperglycemia, unspecified: Secondary | ICD-10-CM | POA: Diagnosis not present

## 2020-06-06 DIAGNOSIS — G8929 Other chronic pain: Secondary | ICD-10-CM | POA: Diagnosis not present

## 2020-06-06 DIAGNOSIS — C679 Malignant neoplasm of bladder, unspecified: Secondary | ICD-10-CM | POA: Diagnosis not present

## 2020-08-29 DIAGNOSIS — C679 Malignant neoplasm of bladder, unspecified: Secondary | ICD-10-CM | POA: Diagnosis not present

## 2020-08-29 DIAGNOSIS — N529 Male erectile dysfunction, unspecified: Secondary | ICD-10-CM | POA: Diagnosis not present

## 2020-08-29 DIAGNOSIS — I110 Hypertensive heart disease with heart failure: Secondary | ICD-10-CM | POA: Diagnosis not present

## 2020-08-29 DIAGNOSIS — G4700 Insomnia, unspecified: Secondary | ICD-10-CM | POA: Diagnosis not present

## 2020-08-29 DIAGNOSIS — I5022 Chronic systolic (congestive) heart failure: Secondary | ICD-10-CM | POA: Diagnosis not present

## 2020-08-29 DIAGNOSIS — K760 Fatty (change of) liver, not elsewhere classified: Secondary | ICD-10-CM | POA: Diagnosis not present

## 2020-08-29 DIAGNOSIS — M545 Low back pain, unspecified: Secondary | ICD-10-CM | POA: Diagnosis not present

## 2020-08-29 DIAGNOSIS — Z953 Presence of xenogenic heart valve: Secondary | ICD-10-CM | POA: Diagnosis not present

## 2020-08-29 DIAGNOSIS — R739 Hyperglycemia, unspecified: Secondary | ICD-10-CM | POA: Diagnosis not present

## 2020-08-29 DIAGNOSIS — I441 Atrioventricular block, second degree: Secondary | ICD-10-CM | POA: Diagnosis not present

## 2020-08-29 DIAGNOSIS — I428 Other cardiomyopathies: Secondary | ICD-10-CM | POA: Diagnosis not present

## 2020-08-29 DIAGNOSIS — Z45018 Encounter for adjustment and management of other part of cardiac pacemaker: Secondary | ICD-10-CM | POA: Diagnosis not present

## 2020-08-29 DIAGNOSIS — Z0000 Encounter for general adult medical examination without abnormal findings: Secondary | ICD-10-CM | POA: Diagnosis not present

## 2020-09-06 DIAGNOSIS — Z950 Presence of cardiac pacemaker: Secondary | ICD-10-CM | POA: Diagnosis not present

## 2020-09-13 DIAGNOSIS — K760 Fatty (change of) liver, not elsewhere classified: Secondary | ICD-10-CM | POA: Diagnosis not present

## 2020-09-13 DIAGNOSIS — I10 Essential (primary) hypertension: Secondary | ICD-10-CM | POA: Diagnosis not present

## 2020-09-13 DIAGNOSIS — R739 Hyperglycemia, unspecified: Secondary | ICD-10-CM | POA: Diagnosis not present

## 2020-09-13 DIAGNOSIS — Z125 Encounter for screening for malignant neoplasm of prostate: Secondary | ICD-10-CM | POA: Diagnosis not present

## 2020-09-27 DIAGNOSIS — Z953 Presence of xenogenic heart valve: Secondary | ICD-10-CM | POA: Diagnosis not present

## 2020-09-27 DIAGNOSIS — I428 Other cardiomyopathies: Secondary | ICD-10-CM | POA: Diagnosis not present

## 2020-09-27 DIAGNOSIS — Z950 Presence of cardiac pacemaker: Secondary | ICD-10-CM | POA: Diagnosis not present

## 2020-09-27 DIAGNOSIS — I441 Atrioventricular block, second degree: Secondary | ICD-10-CM | POA: Diagnosis not present

## 2020-09-28 DIAGNOSIS — I4510 Unspecified right bundle-branch block: Secondary | ICD-10-CM | POA: Diagnosis not present

## 2020-09-28 DIAGNOSIS — Z950 Presence of cardiac pacemaker: Secondary | ICD-10-CM | POA: Diagnosis not present

## 2020-10-24 ENCOUNTER — Encounter: Payer: Self-pay | Admitting: Cardiovascular Disease

## 2020-10-24 DIAGNOSIS — R8279 Other abnormal findings on microbiological examination of urine: Secondary | ICD-10-CM | POA: Diagnosis not present

## 2020-10-24 DIAGNOSIS — C670 Malignant neoplasm of trigone of bladder: Secondary | ICD-10-CM | POA: Diagnosis not present

## 2020-11-14 DIAGNOSIS — Z4502 Encounter for adjustment and management of automatic implantable cardiac defibrillator: Secondary | ICD-10-CM | POA: Diagnosis not present

## 2020-11-14 DIAGNOSIS — I441 Atrioventricular block, second degree: Secondary | ICD-10-CM | POA: Diagnosis not present

## 2021-02-01 ENCOUNTER — Encounter: Payer: Self-pay | Admitting: Student

## 2021-02-01 ENCOUNTER — Other Ambulatory Visit: Payer: Self-pay | Admitting: Student

## 2021-02-13 ENCOUNTER — Ambulatory Visit: Payer: PPO | Admitting: Surgical

## 2021-02-20 DIAGNOSIS — D649 Anemia, unspecified: Secondary | ICD-10-CM | POA: Diagnosis not present

## 2021-02-20 DIAGNOSIS — Z87891 Personal history of nicotine dependence: Secondary | ICD-10-CM | POA: Diagnosis not present

## 2021-02-20 DIAGNOSIS — I428 Other cardiomyopathies: Secondary | ICD-10-CM | POA: Diagnosis not present

## 2021-02-20 DIAGNOSIS — Z950 Presence of cardiac pacemaker: Secondary | ICD-10-CM | POA: Diagnosis not present

## 2021-02-20 DIAGNOSIS — Z954 Presence of other heart-valve replacement: Secondary | ICD-10-CM | POA: Diagnosis not present

## 2021-02-20 DIAGNOSIS — I110 Hypertensive heart disease with heart failure: Secondary | ICD-10-CM | POA: Diagnosis not present

## 2021-02-20 DIAGNOSIS — I5022 Chronic systolic (congestive) heart failure: Secondary | ICD-10-CM | POA: Diagnosis not present

## 2021-02-20 DIAGNOSIS — G8929 Other chronic pain: Secondary | ICD-10-CM | POA: Diagnosis not present

## 2021-02-20 DIAGNOSIS — N529 Male erectile dysfunction, unspecified: Secondary | ICD-10-CM | POA: Diagnosis not present

## 2021-02-20 DIAGNOSIS — F5101 Primary insomnia: Secondary | ICD-10-CM | POA: Diagnosis not present

## 2021-02-20 DIAGNOSIS — C679 Malignant neoplasm of bladder, unspecified: Secondary | ICD-10-CM | POA: Diagnosis not present

## 2021-02-28 ENCOUNTER — Ambulatory Visit: Payer: Self-pay

## 2021-02-28 ENCOUNTER — Ambulatory Visit (INDEPENDENT_AMBULATORY_CARE_PROVIDER_SITE_OTHER): Payer: PPO

## 2021-02-28 ENCOUNTER — Ambulatory Visit (INDEPENDENT_AMBULATORY_CARE_PROVIDER_SITE_OTHER): Payer: PPO | Admitting: Orthopedic Surgery

## 2021-02-28 ENCOUNTER — Other Ambulatory Visit: Payer: Self-pay

## 2021-02-28 ENCOUNTER — Encounter: Payer: Self-pay | Admitting: Orthopedic Surgery

## 2021-02-28 DIAGNOSIS — M25551 Pain in right hip: Secondary | ICD-10-CM
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
## 2021-02-28 NOTE — Progress Notes (Signed)
Subjective: Patient is here for ultrasound-guided intra-articular right hip injection.   Pain from DJD.  Objective:  Pain with IR.  Procedure: Ultrasound guided injection is preferred based studies that show increased duration, increased effect, greater accuracy, decreased procedural pain, increased response rate, and decreased cost with ultrasound guided versus blind injection.   Verbal informed consent obtained.  Time-out conducted.  Noted no overlying erythema, induration, or other signs of local infection. Ultrasound-guided right hip injection: After sterile prep with Betadine, injected 4 cc 0.25% bupivacaine without epinephrine and 6 mg betamethasone using a 22-gauge spinal needle, passing the needle through the iliofemoral ligament into the femoral head/neck junction.  Injectate seen filling joint capsule.  Good immediate relief.

## 2021-03-13 DIAGNOSIS — D649 Anemia, unspecified: Secondary | ICD-10-CM | POA: Diagnosis not present

## 2021-03-15 DIAGNOSIS — Z950 Presence of cardiac pacemaker: Secondary | ICD-10-CM | POA: Diagnosis not present

## 2021-04-06 DIAGNOSIS — Z20822 Contact with and (suspected) exposure to covid-19: Secondary | ICD-10-CM | POA: Diagnosis not present

## 2021-04-06 DIAGNOSIS — J029 Acute pharyngitis, unspecified: Secondary | ICD-10-CM | POA: Diagnosis not present

## 2021-04-06 DIAGNOSIS — R059 Cough, unspecified: Secondary | ICD-10-CM | POA: Diagnosis not present

## 2021-04-06 DIAGNOSIS — J069 Acute upper respiratory infection, unspecified: Secondary | ICD-10-CM | POA: Diagnosis not present

## 2021-04-24 DIAGNOSIS — C670 Malignant neoplasm of trigone of bladder: Secondary | ICD-10-CM | POA: Diagnosis not present

## 2021-04-26 ENCOUNTER — Other Ambulatory Visit: Payer: Self-pay | Admitting: Urology

## 2021-05-16 ENCOUNTER — Encounter (HOSPITAL_BASED_OUTPATIENT_CLINIC_OR_DEPARTMENT_OTHER): Payer: Self-pay | Admitting: Urology

## 2021-05-16 ENCOUNTER — Other Ambulatory Visit: Payer: Self-pay

## 2021-05-16 NOTE — Progress Notes (Addendum)
Spoke w/ via phone for pre-op interview---pt Lab needs dos----  I stat              Lab results------see below COVID test -----patient states asymptomatic no test needed Arrive at -------1030 am 05-23-2021 NPO after MN NO Solid Food.  Clear liquids from MN until---930 am Med rec completed Medications to take morning of surgery -----carvedilol, tamsulosin, hydrocodone prn Diabetic medication -----n/a Patient instructed to bring photo id and insurance card day of surgery Patient aware to have Driver (ride ) / caregiver    for 24 hours after surgery friend patricia rei Patient Special Instructions ----- Pre-Op special Istructions ----- Patient verbalized understanding of instructions that were given at this phone interview. Patient denies shortness of breath, chest pain, fever, cough at this phone interview.   Anesthesia Review:s/p aortic valve replacement 2010, pacemaker placement medtronic jan 2018 for high grade heart block, htn, mild nonobstructive cad, bladder cancer, hx of nonischemic cardiomyopathy, pt denies any cardiac s & s or sob at pre op call, cardiac clearance note dr Alphia Moh dated 04-26-2021 on chart for 05-23-2021 surgery  PCP: dr Irene Pap lov 02-20-2021 [facility] Cardiologist :dr Alphia Moh lov 09-27-2020 chart/[facility] f/u in 1 year Chest x-ray :none EKG :09-27-2020 [facility] Echo 01-06-2019 ef 45 to 50 % [facility] Device check 03-15-2021 [facility] Stress test:none Cardiac Cath : 08-09-2016 epic Activity level: does own housework can climb flight of stairs without problems Sleep Study/ CPAP :none ASA / Instructions/ Last Dose :  81 mg aspirin last dose will be day before surgery on 05-22-2021  Requested pacemaker device orders from [facility] on 05-17-2021, call received [facility] , device orders requested via fax number [phone] on 05-21-2021 fax  confirmation received and placed on pt chart. Called dr Philbert Riser office and device orders need to be faxed to [phone] [facility] phone number is [phone]. Device orders faxed to [phone]. (Spoke with [employer]) fax confirmation received and placed on pt chart.

## 2021-05-23 ENCOUNTER — Ambulatory Visit (HOSPITAL_BASED_OUTPATIENT_CLINIC_OR_DEPARTMENT_OTHER)
Admission: RE | Admit: 2021-05-23 | Discharge: 2021-05-23 | Disposition: A | Discharge status: Home / Self Care | Payer: PPO | Attending: Urology | Admitting: Urology

## 2021-05-23 ENCOUNTER — Encounter (HOSPITAL_BASED_OUTPATIENT_CLINIC_OR_DEPARTMENT_OTHER)
Admission: RE | Disposition: A | Discharge status: Home / Self Care | Payer: Self-pay | Source: Home / Self Care | Attending: Urology

## 2021-05-23 ENCOUNTER — Ambulatory Visit (HOSPITAL_BASED_OUTPATIENT_CLINIC_OR_DEPARTMENT_OTHER): Payer: PPO | Admitting: Anesthesiology

## 2021-05-23 ENCOUNTER — Encounter (HOSPITAL_BASED_OUTPATIENT_CLINIC_OR_DEPARTMENT_OTHER): Payer: Self-pay | Admitting: Urology

## 2021-05-23 DIAGNOSIS — Z7982 Long term (current) use of aspirin: Secondary | ICD-10-CM | POA: Insufficient documentation

## 2021-05-23 DIAGNOSIS — I4510 Unspecified right bundle-branch block: Secondary | ICD-10-CM | POA: Diagnosis not present

## 2021-05-23 DIAGNOSIS — C679 Malignant neoplasm of bladder, unspecified: Secondary | ICD-10-CM | POA: Diagnosis not present

## 2021-05-23 DIAGNOSIS — C675 Malignant neoplasm of bladder neck: Secondary | ICD-10-CM | POA: Diagnosis not present

## 2021-05-23 DIAGNOSIS — L821 Other seborrheic keratosis: Secondary | ICD-10-CM | POA: Diagnosis not present

## 2021-05-23 DIAGNOSIS — I441 Atrioventricular block, second degree: Secondary | ICD-10-CM | POA: Diagnosis not present

## 2021-05-23 DIAGNOSIS — C670 Malignant neoplasm of trigone of bladder: Secondary | ICD-10-CM

## 2021-05-23 DIAGNOSIS — L918 Other hypertrophic disorders of the skin: Secondary | ICD-10-CM | POA: Diagnosis not present

## 2021-05-23 DIAGNOSIS — Z79899 Other long term (current) drug therapy: Secondary | ICD-10-CM | POA: Insufficient documentation

## 2021-05-23 DIAGNOSIS — Z87891 Personal history of nicotine dependence: Secondary | ICD-10-CM | POA: Insufficient documentation

## 2021-05-23 HISTORY — DX: Atherosclerotic heart disease of native coronary artery without angina pectoris: I25.10

## 2021-05-23 HISTORY — PX: TRANSURETHRAL RESECTION OF BLADDER TUMOR: SHX2575

## 2021-05-23 HISTORY — PX: EXCISION OF SKIN TAG: SHX6270

## 2021-05-23 HISTORY — DX: Gastro-esophageal reflux disease without esophagitis: K21.9

## 2021-05-23 LAB — POCT I-STAT, CHEM 8
BUN: 25 mg/dL — ABNORMAL HIGH (ref 8–23)
Calcium, Ion: 1.17 mmol/L (ref 1.15–1.40)
Chloride: 108 mmol/L (ref 98–111)
Creatinine, Ser: 0.9 mg/dL (ref 0.61–1.24)
Glucose, Bld: 105 mg/dL — ABNORMAL HIGH (ref 70–99)
HCT: 41 % (ref 39.0–52.0)
Hemoglobin: 13.9 g/dL (ref 13.0–17.0)
Potassium: 4.1 mmol/L (ref 3.5–5.1)
Sodium: 142 mmol/L (ref 135–145)
TCO2: 24 mmol/L (ref 22–32)

## 2021-05-23 SURGERY — TURBT (TRANSURETHRAL RESECTION OF BLADDER TUMOR)
Anesthesia: General | Site: Scrotum

## 2021-05-23 MED ORDER — LIDOCAINE HCL (CARDIAC) PF 100 MG/5ML IV SOSY
PREFILLED_SYRINGE | INTRAVENOUS | Status: DC | PRN
  Administered 2021-05-23: 60 mg via INTRAVENOUS

## 2021-05-23 MED ORDER — PROPOFOL 10 MG/ML IV BOLUS
INTRAVENOUS | Status: DC | PRN
  Administered 2021-05-23: 100 mg via INTRAVENOUS

## 2021-05-23 MED ORDER — EPHEDRINE 5 MG/ML INJ
INTRAVENOUS | Status: AC
  Filled 2021-05-23: qty 5

## 2021-05-23 MED ORDER — PHENYLEPHRINE 40 MCG/ML (10ML) SYRINGE FOR IV PUSH (FOR BLOOD PRESSURE SUPPORT)
PREFILLED_SYRINGE | INTRAVENOUS | Status: AC
  Filled 2021-05-23: qty 10

## 2021-05-23 MED ORDER — LIDOCAINE 2% (20 MG/ML) 5 ML SYRINGE
INTRAMUSCULAR | Status: AC
  Filled 2021-05-23: qty 5

## 2021-05-23 MED ORDER — OXYCODONE HCL 5 MG/5ML PO SOLN
5.0000 mg | Freq: Once | ORAL | Status: DC | PRN
Start: 2021-05-23 — End: 2021-05-23

## 2021-05-23 MED ORDER — IOHEXOL 300 MG/ML  SOLN
INTRAMUSCULAR | Status: DC | PRN
Start: 2021-05-23 — End: 2021-05-23
  Administered 2021-05-23: 17 mL via URETHRAL

## 2021-05-23 MED ORDER — CEFAZOLIN SODIUM-DEXTROSE 2-4 GM/100ML-% IV SOLN
2.0000 g | INTRAVENOUS | Status: AC
  Administered 2021-05-23: 2 g via INTRAVENOUS

## 2021-05-23 MED ORDER — ONDANSETRON HCL 4 MG/2ML IJ SOLN
INTRAMUSCULAR | Status: DC | PRN
  Administered 2021-05-23: 4 mg via INTRAVENOUS

## 2021-05-23 MED ORDER — FENTANYL CITRATE (PF) 100 MCG/2ML IJ SOLN
INTRAMUSCULAR | Status: AC
  Filled 2021-05-23: qty 2

## 2021-05-23 MED ORDER — CEFAZOLIN SODIUM-DEXTROSE 2-4 GM/100ML-% IV SOLN
INTRAVENOUS | Status: AC
  Filled 2021-05-23: qty 100

## 2021-05-23 MED ORDER — FENTANYL CITRATE (PF) 100 MCG/2ML IJ SOLN
INTRAMUSCULAR | Status: DC | PRN
  Administered 2021-05-23: 50 ug via INTRAVENOUS
  Administered 2021-05-23: 25 ug via INTRAVENOUS

## 2021-05-23 MED ORDER — FENTANYL CITRATE (PF) 100 MCG/2ML IJ SOLN: 25.0000 ug | INTRAMUSCULAR | Status: DC | PRN

## 2021-05-23 MED ORDER — DEXAMETHASONE SODIUM PHOSPHATE 10 MG/ML IJ SOLN
INTRAMUSCULAR | Status: AC
  Filled 2021-05-23: qty 1

## 2021-05-23 MED ORDER — MIDAZOLAM HCL 2 MG/2ML IJ SOLN
INTRAMUSCULAR | Status: DC | PRN
  Administered 2021-05-23: 1 mg via INTRAVENOUS

## 2021-05-23 MED ORDER — PHENAZOPYRIDINE HCL 200 MG PO TABS: 200.0000 mg | ORAL_TABLET | Freq: Three times a day (TID) | ORAL | 0 refills | Status: AC | PRN

## 2021-05-23 MED ORDER — ONDANSETRON HCL 4 MG/2ML IJ SOLN
INTRAMUSCULAR | Status: AC
  Filled 2021-05-23: qty 2

## 2021-05-23 MED ORDER — OXYCODONE HCL 5 MG PO TABS: 5.0000 mg | ORAL_TABLET | Freq: Once | ORAL | Status: DC | PRN

## 2021-05-23 MED ORDER — TRAMADOL HCL 50 MG PO TABS: 50.0000 mg | ORAL_TABLET | Freq: Four times a day (QID) | ORAL | 0 refills | Status: DC | PRN

## 2021-05-23 MED ORDER — DEXAMETHASONE SODIUM PHOSPHATE 4 MG/ML IJ SOLN
INTRAMUSCULAR | Status: DC | PRN
  Administered 2021-05-23: 8 mg via INTRAVENOUS

## 2021-05-23 MED ORDER — PROPOFOL 10 MG/ML IV BOLUS
INTRAVENOUS | Status: AC
  Filled 2021-05-23: qty 40

## 2021-05-23 MED ORDER — ONDANSETRON HCL 4 MG/2ML IJ SOLN
4.0000 mg | Freq: Once | INTRAMUSCULAR | Status: DC | PRN
Start: 2021-05-23 — End: 2021-05-23

## 2021-05-23 MED ORDER — LACTATED RINGERS IV SOLN: INTRAVENOUS | Status: DC

## 2021-05-23 MED ORDER — MIDAZOLAM HCL 2 MG/2ML IJ SOLN
INTRAMUSCULAR | Status: AC
  Filled 2021-05-23: qty 2

## 2021-05-23 MED ORDER — SODIUM CHLORIDE 0.9 % IR SOLN
Status: DC | PRN
  Administered 2021-05-23: 4500 mL

## 2021-05-23 MED ORDER — LIDOCAINE HCL (PF) 2 % IJ SOLN
INTRAMUSCULAR | Status: DC | PRN
  Administered 2021-05-23: 2 mL

## 2021-05-23 MED ORDER — PHENYLEPHRINE HCL (PRESSORS) 10 MG/ML IV SOLN
INTRAVENOUS | Status: DC | PRN
  Administered 2021-05-23 (×4): 80 ug via INTRAVENOUS

## 2021-05-23 MED ORDER — EPHEDRINE SULFATE 50 MG/ML IJ SOLN
INTRAMUSCULAR | Status: DC | PRN
  Administered 2021-05-23 (×2): 5 mg via INTRAVENOUS

## 2021-05-23 SURGICAL SUPPLY — 24 items
BAG DRAIN URO-CYSTO SKYTR STRL (DRAIN) ×3 IMPLANT
BAG DRN RND TRDRP ANRFLXCHMBR (UROLOGICAL SUPPLIES)
BAG DRN UROCATH (DRAIN) ×2
BAG URINE DRAIN 2000ML AR STRL (UROLOGICAL SUPPLIES) IMPLANT
CATH FOLEY 3WAY 30CC 22FR (CATHETERS) IMPLANT
CATH URET 5FR 28IN OPEN ENDED (CATHETERS) ×3 IMPLANT
CLOTH BEACON ORANGE TIMEOUT ST (SAFETY) ×3 IMPLANT
ELECT REM PT RETURN 9FT ADLT (ELECTROSURGICAL)
ELECTRODE REM PT RTRN 9FT ADLT (ELECTROSURGICAL) IMPLANT
EVACUATOR MICROVAS BLADDER (UROLOGICAL SUPPLIES) IMPLANT
GLOVE SURG ENC MOIS LTX SZ7.5 (GLOVE) ×3 IMPLANT
GOWN STRL REUS W/TWL LRG LVL3 (GOWN DISPOSABLE) ×3 IMPLANT
HOLDER FOLEY CATH W/STRAP (MISCELLANEOUS) IMPLANT
IV NS IRRIG 3000ML ARTHROMATIC (IV SOLUTION) ×6 IMPLANT
KIT TURNOVER CYSTO (KITS) ×3 IMPLANT
MANIFOLD NEPTUNE II (INSTRUMENTS) ×3 IMPLANT
NEEDLE HYPO 22GX1.5 SAFETY (NEEDLE) ×3 IMPLANT
NS IRRIG 500ML POUR BTL (IV SOLUTION) ×3 IMPLANT
PACK CYSTO (CUSTOM PROCEDURE TRAY) ×3 IMPLANT
SUT CHROMIC 3 0 SH 27 (SUTURE) ×3 IMPLANT
SYR 30ML LL (SYRINGE) IMPLANT
SYR CONTROL 10ML LL (SYRINGE) ×3 IMPLANT
TUBE CONNECTING 12X1/4 (SUCTIONS) ×3 IMPLANT
WATER STERILE IRR 500ML POUR (IV SOLUTION) IMPLANT

## 2021-05-23 NOTE — Anesthesia Procedure Notes (Signed)
Procedure Name: LMA Insertion Date/Time: 05/23/2021 12:29 PM Performed by: Georgeanne Nim, CRNA Pre-anesthesia Checklist: Patient identified, Emergency Drugs available, Suction available, Patient being monitored and Timeout performed Patient Re-evaluated:Patient Re-evaluated prior to induction Oxygen Delivery Method: Circle system utilized Preoxygenation: Pre-oxygenation with 100% oxygen Induction Type: IV induction Ventilation: Mask ventilation without difficulty LMA: LMA inserted LMA Size: 5.0 Number of attempts: 1 Placement Confirmation: positive ETCO2, CO2 detector and breath sounds checked- equal and bilateral Tube secured with: Tape Dental Injury: Teeth and Oropharynx as per pre-operative assessment

## 2021-05-23 NOTE — Transfer of Care (Signed)
Immediate Anesthesia Transfer of Care Note  Patient: [name].  Procedure(s) Performed: TRANSURETHRAL RESECTION OF BLADDER TUMOR (TURBT) BILATERAL RETROGRADE PYELOGRAM (Bilateral: Bladder) EXCISION OF SKIN TAG (Scrotum)  Patient Location: PACU  Anesthesia Type:General  Level of Consciousness: awake, alert , oriented and patient cooperative  Airway & Oxygen Therapy: Patient Spontanous Breathing  Post-op Assessment: Report given to RN and Post -op Vital signs reviewed and stable  Post vital signs: Reviewed and stable  Last Vitals:  Vitals Value Taken Time  BP 129/85 05/23/21 1316  Temp 36.8 C 05/23/21 1317  Pulse 92 05/23/21 1317  Resp 12 05/23/21 1317  SpO2 97 % 05/23/21 1317  Vitals shown include unvalidated device data.  Last Pain:  Vitals:   05/23/21 1045  TempSrc: Oral  PainSc: 3       Patients Stated Pain Goal: 2 (72/09/19 8022)  Complications: No notable events documented.

## 2021-05-23 NOTE — Discharge Instructions (Addendum)
May apply bacitracin to the small stitch on the right hemiscrotum where the skin tag was removed.  May shower in 24 hours.  The stitch will dissolve, there is no additional care necessary.   Transurethral Resection of Bladder Tumor (TURBT) or Bladder Biopsy   Definition:  Transurethral Resection of the Bladder Tumor is a surgical procedure used to diagnose and remove tumors within the bladder. TURBT is the most common treatment for early stage bladder cancer.  General instructions:     Your recent bladder surgery requires very little post hospital care but some definite precautions.  Despite the fact that no skin incisions were used, the area around the bladder incisions are raw and covered with scabs to promote healing and prevent bleeding. Certain precautions are needed to insure that the scabs are not disturbed over the next 2-4 weeks while the healing proceeds.  Because the raw surface inside your bladder and the irritating effects of urine you may expect frequency of urination and/or urgency (a stronger desire to urinate) and perhaps even getting up at night more often. This will usually resolve or improve slowly over the healing period. You may see some blood in your urine over the first 6 weeks. Do not be alarmed, even if the urine was clear for a while. Get off your feet and drink lots of fluids until clearing occurs. If you start to pass clots or don't improve call us.  Diet:  You may return to your normal diet immediately. Because of the raw surface of your bladder, alcohol, spicy foods, foods high in acid and drinks with caffeine may cause irritation or frequency and should be used in moderation. To keep your urine flowing freely and avoid constipation, drink plenty of fluids during the day (8-10 glasses). Tip: Avoid cranberry juice because it is very acidic.  Activity:  Your physical activity doesn't need to be restricted. However, if you are very active, you may see some blood  in the urine. We suggest that you reduce your activity under the circumstances until the bleeding has stopped.  Bowels:  It is important to keep your bowels regular during the postoperative period. Straining with bowel movements can cause bleeding. A bowel movement every other day is reasonable. Use a mild laxative if needed, such as milk of magnesia 2-3 tablespoons, or 2 Dulcolax tablets. Call if you continue to have problems. If you had been taking narcotics for pain, before, during or after your surgery, you may be constipated. Take a laxative if necessary.    Medication:  You should resume your pre-surgery medications unless told not to. In addition you may be given an antibiotic to prevent or treat infection. Antibiotics are not always necessary. All medication should be taken as prescribed until the bottles are finished unless you are having an unusual reaction to one of the drugs.      Post Anesthesia Home Care Instructions  Activity: Get plenty of rest for the remainder of the day. A responsible individual must stay with you for 24 hours following the procedure.  For the next 24 hours, DO NOT: -Drive a car -Paediatric nurse -Drink alcoholic beverages -Take any medication unless instructed by your physician -Make any legal decisions or sign important papers.  Meals: Start with liquid foods such as gelatin or soup. Progress to regular foods as tolerated. Avoid greasy, spicy, heavy foods. If nausea and/or vomiting occur, drink only clear liquids until the nausea and/or vomiting subsides. Call your physician if vomiting continues.  Special  Instructions/Symptoms: Your throat may feel dry or sore from the anesthesia or the breathing tube placed in your throat during surgery. If this causes discomfort, gargle with warm salt water. The discomfort should disappear within 24 hours.

## 2021-05-23 NOTE — Anesthesia Preprocedure Evaluation (Signed)
Anesthesia Evaluation  Patient identified by MRN, date of birth, ID band Patient awake    Reviewed: Allergy & Precautions, NPO status , Patient's Chart, lab work & pertinent test results, reviewed documented beta blocker date and time   Airway Mallampati: I  TM Distance: >3 FB Neck ROM: Full    Dental no notable dental hx. (+) Teeth Intact, Dental Advisory Given   Pulmonary former smoker,    Pulmonary exam normal breath sounds clear to auscultation       Cardiovascular hypertension, Pt. on medications and Pt. on home beta blockers + CAD and +CHF  Normal cardiovascular exam+ dysrhythmias + pacemaker + Valvular Problems/Murmurs AI  Rhythm:Regular Rate:Normal  S/P bioprosthetic AVR 05/2009 Hx/o bicuspid AV LVEF 45-50% Echo 01/06/2019   Neuro/Psych  Headaches, Hx/o cluster HA's , none since age 65 negative psych ROS   GI/Hepatic Neg liver ROS, GERD  Medicated and Controlled,  Endo/Other  Hyperlipidemia  Renal/GU negative Renal ROS   Bladder Ca    Musculoskeletal  (+) Arthritis , Osteoarthritis,  Hx/o lumbar Stenosis S/P laminectomy   Abdominal   Peds  Hematology negative hematology ROS (+)   Anesthesia Other Findings   Reproductive/Obstetrics                             Anesthesia Physical Anesthesia Plan  ASA: 3  Anesthesia Plan: General   Post-op Pain Management:    Induction: Intravenous  PONV Risk Score and Plan: 4 or greater and Treatment may vary due to age or medical condition and Ondansetron  Airway Management Planned: LMA  Additional Equipment: None  Intra-op Plan:   Post-operative Plan: Extubation in OR  Informed Consent: I have reviewed the patients History and Physical, chart, labs and discussed the procedure including the risks, benefits and alternatives for the proposed anesthesia with the patient or authorized representative who has indicated his/her understanding  and acceptance.     Dental advisory given  Plan Discussed with: CRNA and Anesthesiologist  Anesthesia Plan Comments:         Anesthesia Quick Evaluation

## 2021-05-23 NOTE — Anesthesia Postprocedure Evaluation (Signed)
Anesthesia Post Note  Patient: Laramie Meissner.  Procedure(s) Performed: TRANSURETHRAL RESECTION OF BLADDER TUMOR (TURBT) BILATERAL RETROGRADE PYELOGRAM (Bilateral: Bladder) EXCISION OF SKIN TAG (Scrotum)     Patient location during evaluation: PACU Anesthesia Type: General Level of consciousness: awake and alert and oriented Pain management: pain level controlled Vital Signs Assessment: post-procedure vital signs reviewed and stable Respiratory status: spontaneous breathing, nonlabored ventilation and respiratory function stable Cardiovascular status: blood pressure returned to baseline and stable Postop Assessment: no apparent nausea or vomiting Anesthetic complications: no   No notable events documented.  Last Vitals:  Vitals:   05/23/21 1330 05/23/21 1345  BP: 97/69 121/78  Pulse: 75 70  Resp: 11 15  Temp:    SpO2: 97% 95%    Last Pain:  Vitals:   05/23/21 1345  TempSrc:   PainSc: 0-No pain                 Yazlyn Wentzel A.

## 2021-05-23 NOTE — Interval H&P Note (Signed)
History and Physical Interval Note:  05/23/2021 12:08 PM  [name].  has presented today for surgery, with the diagnosis of BLADDER CANCER.  The various methods of treatment have been discussed with the patient and family. After consideration of risks, benefits and other options for treatment, the patient has consented to  Procedure(s): TRANSURETHRAL RESECTION OF BLADDER TUMOR (TURBT) BILATERAL RETROGRADE PYELOGRAM (Bilateral) as a surgical intervention.  The patient's history has been reviewed, patient examined, no change in status, stable for surgery.  I have reviewed the patient's chart and labs.  Questions were answered to the patient's satisfaction.    Patient has also asked me to excise his scrotal skin tag, which I have agreed to do while he is under anesthesia.   Ardis Hughs

## 2021-05-23 NOTE — Op Note (Signed)
Preoperative diagnosis:  Recurrent bladder cancer Scrotal skin tag  Postoperative diagnosis:  Same  Procedure: Scrotal skin tag removal, 5 mm Bilateral retrograde pyelogram with interpretation Transurethral section of bladder tumor, 2 cm  Surgeon: Ardis Hughs, MD  Anesthesia: General  Complications: None  Intraoperative findings:  #1: The patient had a small skin tag on the scrotum that was easily removed and sent for further pathology #2: Patient's right retrograde pyelogram was performed with 10 cc of Omnipaque contrast and demonstrated normal caliber ureter with no filling defects, sharp calyces and no hydronephrosis. #3: The patient's left retrograde pyelogram was performed with 10 cc of Omnipaque contrast and demonstrated normal caliber ureter with no filling defects or abnormalities.  The calyces were sharp. #4: The patient had 2 discrete tumors on the left bladder neck at the 3 o'clock position.  There was mucosal fetal changes and abnormality surrounding these 2 discrete lesions for a total of 2 cm around the bladder neck and left lateral bladder wall.   EBL: Minimal  Specimens:  1: Scrotal skin tag #2: Bladder neck tumor  Indication: [name]. is a 65 y.o. patient with history of low-grade recurrent bladder cancer who was found to have recurrence of cancer during a surveillance cystoscopy in the office.  In addition, the patient has a small skin tag on his right hemiscrotum.  After reviewing the management options for treatment, he elected to proceed with the above surgical procedure(s). We have discussed the potential benefits and risks of the procedure, side effects of the proposed treatment, the likelihood of the patient achieving the goals of the procedure, and any potential problems that might occur during the procedure or recuperation. Informed consent has been obtained.  Description of procedure:  The patient was taken to the operating room and  general anesthesia was induced.  The patient was placed in the dorsal lithotomy position, prepped and draped in the usual sterile fashion, and preoperative antibiotics were administered. A preoperative time-out was performed.   The skin tag was pulled up and underneath in the dermis layer 2 cc of lidocaine was injected.  I then used the sharp scalpel and removed the skin tag and the underlying epidermis.  I then closed the epidermis layer with a 3-0 chromic suture.  A 21 French 30 degree cystoscope was then gently passed through the patient's urethra into the bladder under visual guidance.  Cystoscopy was performed with the above findings.  I then used a 7 degree lens to get a better look at the bladder neck and the surrounding areas.  I subsequently repassed the 30 degree lens and performed bilateral retrograde pyelograms below findings.  I then removed the cystoscope and passed in the resectoscope sheath with a 30 degree lens using the visual obturator.  Once inside the bladder I exchanged the obturator for the loop element and performed a transurethral resection of the bladder tumor at around the bladder neck.  I did fulgurate the area copiously.  In addition I fulgurated a small area in the posterior bladder wall as well because it seemed to be abnormal.  I reinspected the bladder noting excellent hemostasis and no other lesions.  I evacuated the lesion and sent them to pathology.  Hemostasis was noted to be quite good, and as such the bladder was emptied and the scope removed.  The patient was subsequently extubated return the PACU in stable condition.  Ardis Hughs, M.D.
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
## 2021-05-24 ENCOUNTER — Encounter (HOSPITAL_BASED_OUTPATIENT_CLINIC_OR_DEPARTMENT_OTHER): Payer: Self-pay | Admitting: Urology

## 2021-05-24 DIAGNOSIS — Z6826 Body mass index (BMI) 26.0-26.9, adult: Secondary | ICD-10-CM | POA: Diagnosis not present

## 2021-05-24 DIAGNOSIS — M5440 Lumbago with sciatica, unspecified side: Secondary | ICD-10-CM | POA: Diagnosis not present

## 2021-05-24 DIAGNOSIS — R030 Elevated blood-pressure reading, without diagnosis of hypertension: Secondary | ICD-10-CM | POA: Diagnosis not present

## 2021-05-24 LAB — SURGICAL PATHOLOGY

## 2021-05-29 ENCOUNTER — Other Ambulatory Visit: Payer: Self-pay | Admitting: Neurosurgery

## 2021-05-29 DIAGNOSIS — M5440 Lumbago with sciatica, unspecified side: Secondary | ICD-10-CM

## 2021-06-01 DIAGNOSIS — R350 Frequency of micturition: Secondary | ICD-10-CM | POA: Diagnosis not present

## 2021-06-05 ENCOUNTER — Ambulatory Visit
Admission: RE | Admit: 2021-06-05 | Discharge: 2021-06-05 | Disposition: A | Discharge status: Home / Self Care | Payer: PPO | Source: Ambulatory Visit | Attending: Neurosurgery | Admitting: Neurosurgery

## 2021-06-05 ENCOUNTER — Other Ambulatory Visit: Payer: Self-pay

## 2021-06-05 DIAGNOSIS — M5440 Lumbago with sciatica, unspecified side: Secondary | ICD-10-CM

## 2021-06-05 DIAGNOSIS — M5136 Other intervertebral disc degeneration, lumbar region: Secondary | ICD-10-CM | POA: Diagnosis not present

## 2021-06-05 DIAGNOSIS — M5126 Other intervertebral disc displacement, lumbar region: Secondary | ICD-10-CM | POA: Diagnosis not present

## 2021-06-05 DIAGNOSIS — M4807 Spinal stenosis, lumbosacral region: Secondary | ICD-10-CM | POA: Diagnosis not present

## 2021-06-05 DIAGNOSIS — M47816 Spondylosis without myelopathy or radiculopathy, lumbar region: Secondary | ICD-10-CM | POA: Diagnosis not present

## 2021-06-05 MED ORDER — MEPERIDINE HCL 50 MG/ML IJ SOLN
50.0000 mg | Freq: Once | INTRAMUSCULAR | Status: DC | PRN
Start: 2021-06-05 — End: 2021-06-06

## 2021-06-05 MED ORDER — DIAZEPAM 5 MG PO TABS
5.0000 mg | ORAL_TABLET | Freq: Once | ORAL | Status: AC
  Administered 2021-06-05: 5 mg via ORAL

## 2021-06-05 MED ORDER — ONDANSETRON HCL 4 MG/2ML IJ SOLN: 4.0000 mg | Freq: Once | INTRAMUSCULAR | Status: DC | PRN

## 2021-06-05 MED ORDER — DIAZEPAM 5 MG PO TABS
5.0000 mg | ORAL_TABLET | Freq: Once | ORAL | Status: DC
Start: 2021-06-05 — End: 2021-06-06

## 2021-06-05 MED ORDER — IOPAMIDOL (ISOVUE-M 200) INJECTION 41%
20.0000 mL | Freq: Once | INTRAMUSCULAR | Status: AC
  Administered 2021-06-05: 20 mL via INTRATHECAL

## 2021-06-05 NOTE — Discharge Instructions (Signed)

## 2021-06-14 DIAGNOSIS — Z950 Presence of cardiac pacemaker: Secondary | ICD-10-CM | POA: Diagnosis not present

## 2021-06-14 DIAGNOSIS — M5126 Other intervertebral disc displacement, lumbar region: Secondary | ICD-10-CM | POA: Diagnosis not present

## 2021-06-18 ENCOUNTER — Other Ambulatory Visit: Payer: Self-pay | Admitting: Neurosurgery

## 2021-06-18 DIAGNOSIS — M5126 Other intervertebral disc displacement, lumbar region: Secondary | ICD-10-CM

## 2021-06-21 ENCOUNTER — Ambulatory Visit
Admission: RE | Admit: 2021-06-21 | Discharge: 2021-06-21 | Disposition: A | Discharge status: Home / Self Care | Payer: PPO | Source: Ambulatory Visit | Attending: Neurosurgery | Admitting: Neurosurgery

## 2021-06-21 DIAGNOSIS — M5126 Other intervertebral disc displacement, lumbar region: Secondary | ICD-10-CM

## 2021-06-21 DIAGNOSIS — M4727 Other spondylosis with radiculopathy, lumbosacral region: Secondary | ICD-10-CM | POA: Diagnosis not present

## 2021-06-21 MED ORDER — METHYLPREDNISOLONE ACETATE 40 MG/ML INJ SUSP (RADIOLOG
80.0000 mg | Freq: Once | INTRAMUSCULAR | Status: AC
  Administered 2021-06-21: 11:00:00 80 mg via EPIDURAL

## 2021-06-21 MED ORDER — IOPAMIDOL (ISOVUE-M 200) INJECTION 41%
1.0000 mL | Freq: Once | INTRAMUSCULAR | Status: AC
  Administered 2021-06-21: 11:00:00 1 mL via EPIDURAL

## 2021-06-21 NOTE — Discharge Instructions (Signed)

## 2021-07-19 ENCOUNTER — Other Ambulatory Visit: Payer: Self-pay | Admitting: Neurosurgery

## 2021-07-19 DIAGNOSIS — M5440 Lumbago with sciatica, unspecified side: Secondary | ICD-10-CM

## 2021-07-19 DIAGNOSIS — Z6827 Body mass index (BMI) 27.0-27.9, adult: Secondary | ICD-10-CM | POA: Diagnosis not present

## 2021-07-19 DIAGNOSIS — I10 Essential (primary) hypertension: Secondary | ICD-10-CM | POA: Diagnosis not present

## 2021-08-08 ENCOUNTER — Ambulatory Visit
Admission: RE | Admit: 2021-08-08 | Discharge: 2021-08-08 | Disposition: A | Discharge status: Home / Self Care | Payer: PPO | Source: Ambulatory Visit | Attending: Neurosurgery | Admitting: Neurosurgery

## 2021-08-08 ENCOUNTER — Other Ambulatory Visit: Payer: Self-pay

## 2021-08-08 DIAGNOSIS — M5440 Lumbago with sciatica, unspecified side: Secondary | ICD-10-CM

## 2021-08-08 MED ORDER — IOPAMIDOL (ISOVUE-M 200) INJECTION 41%
1.0000 mL | Freq: Once | INTRAMUSCULAR | Status: AC
  Administered 2021-08-08: 1 mL via EPIDURAL

## 2021-08-08 MED ORDER — METHYLPREDNISOLONE ACETATE 40 MG/ML INJ SUSP (RADIOLOG
80.0000 mg | Freq: Once | INTRAMUSCULAR | Status: AC
  Administered 2021-08-08: 80 mg via EPIDURAL

## 2021-08-08 NOTE — Discharge Instructions (Signed)

## 2021-11-15 ENCOUNTER — Other Ambulatory Visit: Payer: Self-pay | Admitting: Urology

## 2021-11-19 ENCOUNTER — Other Ambulatory Visit: Payer: Self-pay | Admitting: Urology

## 2021-12-17 IMAGING — CT CT L SPINE W/ CM
1 of 6 series · 6 of 14 positions shown, 8 images · non-contrast
Comparison: CT abdomen pelvis dated September 25, 2016.

CLINICAL DATA: Chronic bilateral posterior thigh and calf pain,
numbness, and tingling. No prior surgery.
TECHNIQUE: Contiguous axial images were obtained through the lumbar spine after
the intrathecal infusion of contrast. Coronal and sagittal
reconstructions were obtained of the axial image sets.

[Series 4: l spine soft · axial · 0.33mm/px · z∈[-373,-196]mm · 6 of 83 slices shown, 8 images]
[im 12/83  soft-tissue]
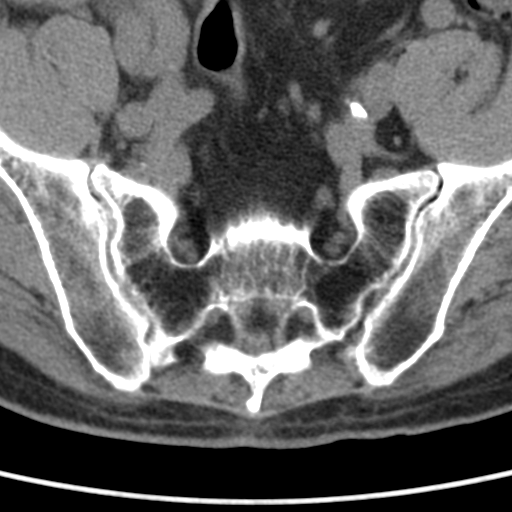
[im 12/83  bone]
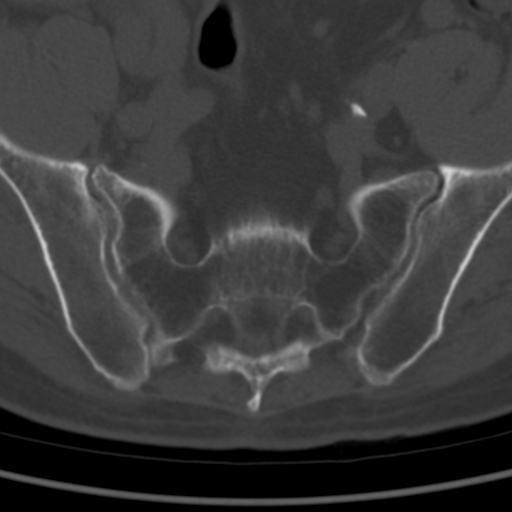
[im 24/83  bone]
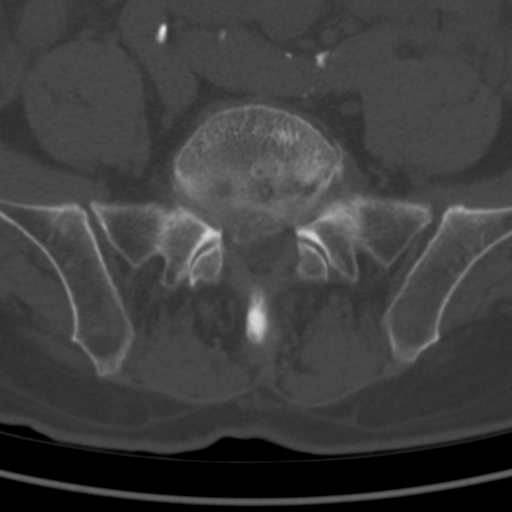
[im 36/83  bone]
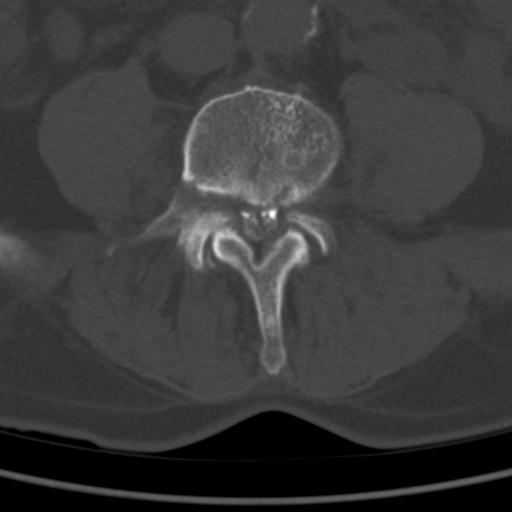
[im 47/83  bone]
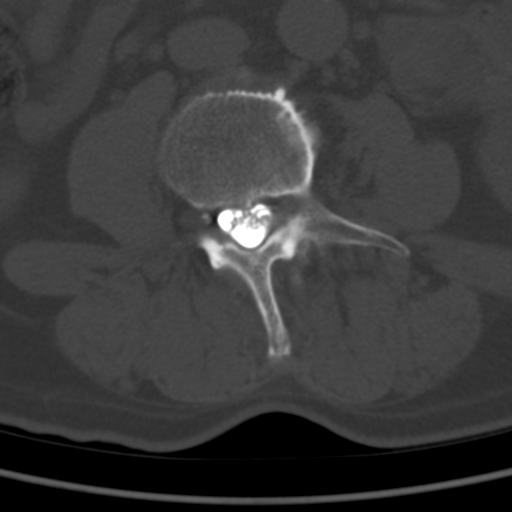
[im 59/83  soft-tissue]
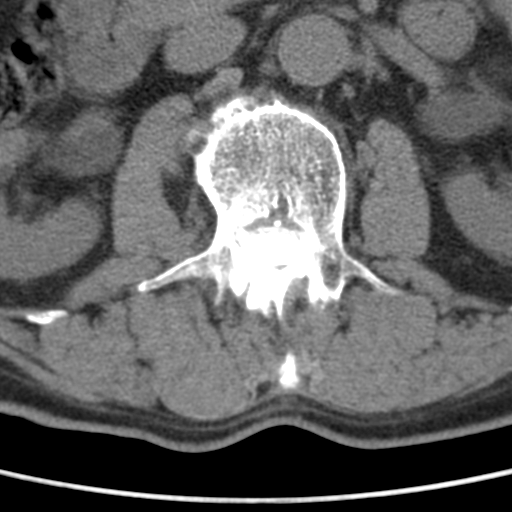
[im 59/83  bone]
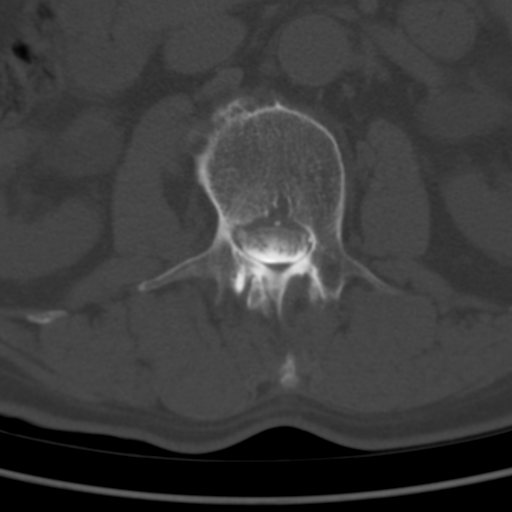
[im 71/83  bone]
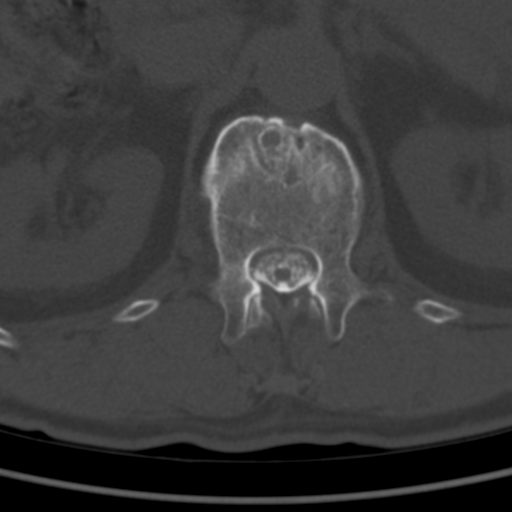

[6 of 14 positions shown; findings below may reference images not displayed]

EXAM:
LUMBAR MYELOGRAM

CT LUMBAR MYELOGRAM

FLUOROSCOPY TIME:  Radiation Exposure Index (as provided by the
fluoroscopic device): 28.3 mGy

Fluoroscopy Time:  2 minutes, 5 seconds

Number of Acquired Images:  17

PROCEDURE:
After thorough discussion of risks and benefits of the procedure
including bleeding, infection, injury to nerves, blood vessels,
adjacent structures as well as headache and CSF leak, written and
oral informed consent was obtained. Consent was obtained by Dr.
Abde Nour Athamnia. Time out form was completed.

Patient was positioned prone on the fluoroscopy table. Local
anesthesia was provided with 1% lidocaine without epinephrine after
prepped and draped in the usual sterile fashion. Puncture was
performed at L3-L4 using a 3 1/2 inch 22-gauge spinal needle via
right interlaminar approach. Using a single pass through the dura,
the needle was placed within the thecal sac, with return of clear
CSF. 15 mL of Isovue B-LCC was injected into the thecal sac, with
normal opacification of the nerve roots and cauda equina consistent
with free flow within the subarachnoid space.

I personally performed the lumbar puncture and administered the
intrathecal contrast. I also personally supervised acquisition of
the myelogram images.
FINDINGS: LUMBAR MYELOGRAM FINDINGS:

Mild dextroscoliosis. Sagittal alignment is maintained. No dynamic
instability. Ventral extradural defects from T12-L1 through L4-L5.
Severe spinal canal stenosis at L4-L5 with complete contrast block.
Moderate spinal canal stenosis at L1-L2 and L2-L3. Mild-to-moderate
spinal canal stenosis at L3-L4. Underfilling of the left L3 and
bilateral L4 nerve roots.

CT LUMBAR MYELOGRAM FINDINGS:

Segmentation: Standard.

Alignment: Mild dextroscoliosis.  Trace retrolisthesis at L2-L3.

Vertebrae: No acute fracture or other focal pathologic process.
Chronic moderate to severe L1 compression deformity.

Conus medullaris and cauda equina: Conus extends to the L1 level.
Conus appears normal. Clumping of the cauda equina nerve roots due
to downstream stenosis.

Paraspinal and other soft tissues: Aortoiliac atherosclerotic
vascular disease.

Disc levels:

T12-L1: Mild disc bulging and right facet arthropathy. No stenosis.

L1-L2: Moderate diffuse disc bulging and endplate spurring eccentric
to the right. Right subarticular disc osteophyte complex. Moderate
spinal canal and right lateral recess stenosis. Moderate bilateral
neuroforaminal stenosis.

L2-L3: Moderate circumferential disc osteophyte complex. Moderate
spinal canal and left greater than right recess stenosis. Mild
bilateral neuroforaminal stenosis.

L3-L4: Small circumferential disc osteophyte complex eccentric to
left. Mild left facet arthropathy. Mild-to-moderate spinal canal
stenosis. Moderate left lateral recess stenosis. Moderate left
neuroforaminal stenosis. No right neuroforaminal stenosis.

L4-L5: Moderate circumferential disc osteophyte complex eccentric to
the right. Mild bilateral facet arthropathy. Severe spinal canal
stenosis. Severe right and moderate left neuroforaminal stenosis.

L5-S1: Mild diffuse disc bulging with superimposed large right
subarticular disc protrusion. Mild spinal canal stenosis. Severe
right lateral recess stenosis. Moderate bilateral neuroforaminal
stenosis.
IMPRESSION: 1. Advanced multilevel lumbar spondylosis as described above. Severe
spinal canal and right neuroforaminal stenosis at L4-L5.
2. Moderate stenosis at L1-L2 and L2-L3.
3. Large right subarticular disc protrusion at L5-S1 with severe
right lateral recess stenosis.
4. Mild-to-moderate spinal canal stenosis at L3-L4 with moderate
left lateral recess and neuroforaminal stenosis.
5. Chronic moderate to severe L1 compression deformity.

## 2021-12-28 ENCOUNTER — Other Ambulatory Visit: Payer: Self-pay

## 2021-12-28 ENCOUNTER — Encounter (HOSPITAL_BASED_OUTPATIENT_CLINIC_OR_DEPARTMENT_OTHER): Payer: Self-pay | Admitting: Urology

## 2021-12-28 NOTE — Progress Notes (Signed)
Spoke w/ via phone for pre-op interview---pt Lab needs dos----    ekg, istat           Lab results------see below COVID test -----patient states asymptomatic no test needed Arrive at -------830 am 01-02-2022 NPO after MN NO Solid Food.  Clear liquids from MN until---730 am Med rec completed Medications to take morning of surgery -----flonase, carvedilol, flomax, entresto Diabetic medication -----n/a Patient instructed no nail polish to be worn day of surgery Patient instructed to bring photo id and insurance card day of surgery Patient aware to have Driver (ride ) / caregiver friend Salome Arnt will stay    for 24 hours after surgery  Patient Special Instructions -----pt to take 81 mg aspirin day before surgery 01-01-2022 per [facility] clearance note on chart dated 12-28-2021 for 01-02-2022 surgery Pre-Op special Istructions -----n/a Patient verbalized understanding of instructions that were given at this phone interview. Patient denies shortness of breath, chest pain, fever, cough at this phone interview.    Device orders requested via fax to [facility] fax [phone] phone [phone] Last device check 12-13-2021 epic [facility] cardiology 12-13-2021 epic

## 2021-12-28 NOTE — Progress Notes (Signed)
Pam at [facility] called for cardiac clearance and aspirin orders. Reviewed chart with Dr. Rodman Comp and okay to proceed with surgery at [facility].

## 2022-01-02 ENCOUNTER — Other Ambulatory Visit: Payer: Self-pay

## 2022-01-02 ENCOUNTER — Ambulatory Visit (HOSPITAL_BASED_OUTPATIENT_CLINIC_OR_DEPARTMENT_OTHER)
Admission: RE | Admit: 2022-01-02 | Discharge: 2022-01-02 | Disposition: A | Discharge status: Home / Self Care | Payer: Medicare HMO | Attending: Urology | Admitting: Urology

## 2022-01-02 ENCOUNTER — Ambulatory Visit (HOSPITAL_BASED_OUTPATIENT_CLINIC_OR_DEPARTMENT_OTHER): Payer: Medicare HMO | Admitting: Anesthesiology

## 2022-01-02 ENCOUNTER — Encounter (HOSPITAL_BASED_OUTPATIENT_CLINIC_OR_DEPARTMENT_OTHER)
Admission: RE | Disposition: A | Discharge status: Home / Self Care | Payer: Self-pay | Source: Home / Self Care | Attending: Urology

## 2022-01-02 ENCOUNTER — Encounter (HOSPITAL_BASED_OUTPATIENT_CLINIC_OR_DEPARTMENT_OTHER): Payer: Self-pay | Admitting: Urology

## 2022-01-02 DIAGNOSIS — Z87891 Personal history of nicotine dependence: Secondary | ICD-10-CM

## 2022-01-02 DIAGNOSIS — I2510 Atherosclerotic heart disease of native coronary artery without angina pectoris: Secondary | ICD-10-CM | POA: Diagnosis not present

## 2022-01-02 DIAGNOSIS — I509 Heart failure, unspecified: Secondary | ICD-10-CM

## 2022-01-02 DIAGNOSIS — Z950 Presence of cardiac pacemaker: Secondary | ICD-10-CM | POA: Diagnosis not present

## 2022-01-02 DIAGNOSIS — I10 Essential (primary) hypertension: Secondary | ICD-10-CM

## 2022-01-02 DIAGNOSIS — C670 Malignant neoplasm of trigone of bladder: Secondary | ICD-10-CM | POA: Insufficient documentation

## 2022-01-02 DIAGNOSIS — I110 Hypertensive heart disease with heart failure: Secondary | ICD-10-CM

## 2022-01-02 DIAGNOSIS — C678 Malignant neoplasm of overlapping sites of bladder: Secondary | ICD-10-CM

## 2022-01-02 DIAGNOSIS — N210 Calculus in bladder: Secondary | ICD-10-CM | POA: Insufficient documentation

## 2022-01-02 DIAGNOSIS — Z01818 Encounter for other preprocedural examination: Secondary | ICD-10-CM

## 2022-01-02 DIAGNOSIS — C679 Malignant neoplasm of bladder, unspecified: Secondary | ICD-10-CM

## 2022-01-02 HISTORY — PX: CYSTOSCOPY WITH BIOPSY: SHX5122

## 2022-01-02 LAB — POCT I-STAT, CHEM 8
BUN: 19 mg/dL (ref 8–23)
Calcium, Ion: 1.31 mmol/L (ref 1.15–1.40)
Chloride: 105 mmol/L (ref 98–111)
Creatinine, Ser: 0.9 mg/dL (ref 0.61–1.24)
Glucose, Bld: 102 mg/dL — ABNORMAL HIGH (ref 70–99)
HCT: 41 % (ref 39.0–52.0)
Hemoglobin: 13.9 g/dL (ref 13.0–17.0)
Potassium: 3.9 mmol/L (ref 3.5–5.1)
Sodium: 143 mmol/L (ref 135–145)
TCO2: 24 mmol/L (ref 22–32)

## 2022-01-02 SURGERY — CYSTOSCOPY, WITH BIOPSY
Anesthesia: General

## 2022-01-02 MED ORDER — FENTANYL CITRATE (PF) 100 MCG/2ML IJ SOLN
INTRAMUSCULAR | Status: AC
  Filled 2022-01-02: qty 2

## 2022-01-02 MED ORDER — ACETAMINOPHEN 500 MG PO TABS
ORAL_TABLET | ORAL | Status: AC
  Filled 2022-01-02: qty 2

## 2022-01-02 MED ORDER — PROPOFOL 10 MG/ML IV BOLUS
INTRAVENOUS | Status: AC
  Filled 2022-01-02: qty 20

## 2022-01-02 MED ORDER — ONDANSETRON HCL 4 MG/2ML IJ SOLN
INTRAMUSCULAR | Status: DC | PRN
  Administered 2022-01-02: 4 mg via INTRAVENOUS

## 2022-01-02 MED ORDER — ACETAMINOPHEN 500 MG PO TABS
1000.0000 mg | ORAL_TABLET | Freq: Once | ORAL | Status: AC
  Administered 2022-01-02: 1000 mg via ORAL

## 2022-01-02 MED ORDER — DEXAMETHASONE SODIUM PHOSPHATE 10 MG/ML IJ SOLN
INTRAMUSCULAR | Status: DC | PRN
  Administered 2022-01-02: 5 mg via INTRAVENOUS

## 2022-01-02 MED ORDER — PHENYLEPHRINE 80 MCG/ML (10ML) SYRINGE FOR IV PUSH (FOR BLOOD PRESSURE SUPPORT)
PREFILLED_SYRINGE | INTRAVENOUS | Status: DC | PRN
  Administered 2022-01-02 (×2): 80 ug via INTRAVENOUS
  Administered 2022-01-02: 240 ug via INTRAVENOUS
  Administered 2022-01-02: 80 ug via INTRAVENOUS

## 2022-01-02 MED ORDER — TRAMADOL HCL 50 MG PO TABS: 50.0000 mg | ORAL_TABLET | Freq: Four times a day (QID) | ORAL | 0 refills | Status: AC | PRN

## 2022-01-02 MED ORDER — OXYCODONE HCL 5 MG PO TABS
ORAL_TABLET | ORAL | Status: AC
  Filled 2022-01-02: qty 1

## 2022-01-02 MED ORDER — MIDAZOLAM HCL 2 MG/2ML IJ SOLN
INTRAMUSCULAR | Status: DC | PRN
  Administered 2022-01-02: 1 mg via INTRAVENOUS

## 2022-01-02 MED ORDER — MIDAZOLAM HCL 2 MG/2ML IJ SOLN
INTRAMUSCULAR | Status: AC
  Filled 2022-01-02: qty 2

## 2022-01-02 MED ORDER — EPHEDRINE SULFATE-NACL 50-0.9 MG/10ML-% IV SOSY
PREFILLED_SYRINGE | INTRAVENOUS | Status: DC | PRN
  Administered 2022-01-02: 5 mg via INTRAVENOUS

## 2022-01-02 MED ORDER — PROPOFOL 10 MG/ML IV BOLUS
INTRAVENOUS | Status: DC | PRN
  Administered 2022-01-02: 150 mg via INTRAVENOUS

## 2022-01-02 MED ORDER — STERILE WATER FOR IRRIGATION IR SOLN
Status: DC | PRN
  Administered 2022-01-02 (×3): 3000 mL

## 2022-01-02 MED ORDER — GEMCITABINE CHEMO FOR BLADDER INSTILLATION 2000 MG
2000.0000 mg | Freq: Once | INTRAVENOUS | Status: AC
  Administered 2022-01-02: 2000 mg via INTRAVESICAL
  Filled 2022-01-02: qty 2000

## 2022-01-02 MED ORDER — SODIUM CHLORIDE 0.9 % IR SOLN
Status: DC | PRN
  Administered 2022-01-02: 500 mL

## 2022-01-02 MED ORDER — OXYCODONE HCL 5 MG PO TABS
5.0000 mg | ORAL_TABLET | Freq: Once | ORAL | Status: AC
  Administered 2022-01-02: 5 mg via ORAL

## 2022-01-02 MED ORDER — CEFAZOLIN SODIUM-DEXTROSE 2-4 GM/100ML-% IV SOLN
2.0000 g | INTRAVENOUS | Status: AC
  Administered 2022-01-02: 2 g via INTRAVENOUS

## 2022-01-02 MED ORDER — LACTATED RINGERS IV SOLN: INTRAVENOUS | Status: DC

## 2022-01-02 MED ORDER — FENTANYL CITRATE (PF) 250 MCG/5ML IJ SOLN
INTRAMUSCULAR | Status: DC | PRN
  Administered 2022-01-02 (×2): 25 ug via INTRAVENOUS
  Administered 2022-01-02: 50 ug via INTRAVENOUS

## 2022-01-02 MED ORDER — CEFAZOLIN SODIUM-DEXTROSE 2-4 GM/100ML-% IV SOLN
INTRAVENOUS | Status: AC
  Filled 2022-01-02: qty 100

## 2022-01-02 MED ORDER — FENTANYL CITRATE (PF) 100 MCG/2ML IJ SOLN
25.0000 ug | INTRAMUSCULAR | Status: DC | PRN
  Administered 2022-01-02 (×2): 50 ug via INTRAVENOUS

## 2022-01-02 MED ORDER — LIDOCAINE 2% (20 MG/ML) 5 ML SYRINGE
INTRAMUSCULAR | Status: DC | PRN
  Administered 2022-01-02: 40 mg via INTRAVENOUS

## 2022-01-02 SURGICAL SUPPLY — 21 items
BAG DRAIN URO-CYSTO SKYTR STRL (DRAIN) ×2 IMPLANT
BAG DRN RND TRDRP ANRFLXCHMBR (UROLOGICAL SUPPLIES) ×1
BAG DRN UROCATH (DRAIN) ×1
BAG URINE DRAIN 2000ML AR STRL (UROLOGICAL SUPPLIES) ×1 IMPLANT
CATH FOLEY 2WAY SLVR  5CC 18FR (CATHETERS) ×2
CATH FOLEY 2WAY SLVR 5CC 18FR (CATHETERS) IMPLANT
CLOTH BEACON ORANGE TIMEOUT ST (SAFETY) ×2 IMPLANT
ELECT REM PT RETURN 9FT ADLT (ELECTROSURGICAL) ×2
ELECTRODE REM PT RTRN 9FT ADLT (ELECTROSURGICAL) ×1 IMPLANT
FIBER LASER FLEXIVA 1000 (UROLOGICAL SUPPLIES) IMPLANT
FIBER LASER FLEXIVA 550 (UROLOGICAL SUPPLIES) IMPLANT
GLOVE BIO SURGEON STRL SZ7.5 (GLOVE) ×2 IMPLANT
GOWN STRL REUS W/TWL LRG LVL3 (GOWN DISPOSABLE) ×2 IMPLANT
KIT TURNOVER CYSTO (KITS) ×2 IMPLANT
MANIFOLD NEPTUNE II (INSTRUMENTS) ×2 IMPLANT
NS IRRIG 500ML POUR BTL (IV SOLUTION) IMPLANT
PACK CYSTO (CUSTOM PROCEDURE TRAY) ×2 IMPLANT
SYR TOOMEY IRRIG 70ML (MISCELLANEOUS)
SYRINGE TOOMEY IRRIG 70ML (MISCELLANEOUS) IMPLANT
TUBE CONNECTING 12X1/4 (SUCTIONS) IMPLANT
WATER STERILE IRR 3000ML UROMA (IV SOLUTION) ×5 IMPLANT

## 2022-01-02 NOTE — Transfer of Care (Signed)
Immediate Anesthesia Transfer of Care Note  Patient: [name].  Procedure(s) Performed: CYSTOSCOPY WITH BLADDER BIOPSY FULGURATION  AND GEMCITABINE  Patient Location: PACU  Anesthesia Type:General  Level of Consciousness: drowsy  Airway & Oxygen Therapy: Patient Spontanous Breathing  Post-op Assessment: Report given to RN and Post -op Vital signs reviewed and stable  Post vital signs: Reviewed and stable  Last Vitals:  Vitals Value Taken Time  BP 130/89 01/02/22 1121  Temp    Pulse 93 01/02/22 1124  Resp 32 01/02/22 1124  SpO2 97 % 01/02/22 1124  Vitals shown include unvalidated device data.  Last Pain:  Vitals:   01/02/22 0835  TempSrc: Oral  PainSc: 0-No pain      Patients Stated Pain Goal: 2 (20/94/70 9628)  Complications: No notable events documented.
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
## 2022-01-02 NOTE — Anesthesia Preprocedure Evaluation (Addendum)
Anesthesia Evaluation  Patient identified by MRN, date of birth, ID band Patient awake    Reviewed: Allergy & Precautions, NPO status , Patient's Chart, lab work & pertinent test results, reviewed documented beta blocker date and time   Airway Mallampati: I  TM Distance: >3 FB Neck ROM: Full    Dental  (+) Teeth Intact, Dental Advisory Given, Missing,    Pulmonary neg pulmonary ROS, former smoker,    Pulmonary exam normal breath sounds clear to auscultation       Cardiovascular hypertension, Pt. on home beta blockers and Pt. on medications + CAD and +CHF  Normal cardiovascular exam+ dysrhythmias + pacemaker + Valvular Problems/Murmurs (bicuspid AV s/p AVR )  Rhythm:Regular Rate:Normal  RBBB  TTE 2017 - Mildly dilated LV with EF 40-45%, diffuse hypokinesis. Mildly  dilated RV with mildly decreased systolic function. Bioprosthetic  aortic valve appears to function normally.   Cath 2018 The left ventricular ejection fraction is 45-50% by visual estimate. There is mild aortic valve stenosis, 25 mm Hg gradient on pullback. Ost RCA to Prox RCA lesion, 25 %stenosed. Minimal left sided disease. LV end diastolic pressure is normal. Plan for pacer placement later today.    Neuro/Psych  Headaches, negative psych ROS   GI/Hepatic GERD  ,(+)     substance abuse  alcohol use,   Endo/Other  negative endocrine ROS  Renal/GU negative Renal ROS  negative genitourinary   Musculoskeletal  (+) Arthritis ,   Abdominal   Peds  Hematology negative hematology ROS (+)   Anesthesia Other Findings   Reproductive/Obstetrics                            Anesthesia Physical Anesthesia Plan  ASA: 3  Anesthesia Plan: General   Post-op Pain Management: Tylenol PO (pre-op)*   Induction: Intravenous  PONV Risk Score and Plan: 2 and Ondansetron, Dexamethasone and Midazolam  Airway Management Planned:  LMA  Additional Equipment:   Intra-op Plan:   Post-operative Plan: Extubation in OR  Informed Consent: I have reviewed the patients History and Physical, chart, labs and discussed the procedure including the risks, benefits and alternatives for the proposed anesthesia with the patient or authorized representative who has indicated his/her understanding and acceptance.     Dental advisory given  Plan Discussed with: CRNA  Anesthesia Plan Comments:         Anesthesia Quick Evaluation

## 2022-01-02 NOTE — Interval H&P Note (Signed)
History and Physical Interval Note:  01/02/2022 10:14 AM  [name].  has presented today for surgery, with the diagnosis of RECURRENT BLADDER CANCER, BLADDER STONE.  The various methods of treatment have been discussed with the patient and family. After consideration of risks, benefits and other options for treatment, the patient has consented to  Procedure(s): CYSTOSCOPY WITH BLADDER BIOPSY FULGURATION  AND GEMCITABINE (N/A) CYSTOSCOPY WITH LITHOLAPAXY WITH  LASER (N/A) as a surgical intervention.  The patient's history has been reviewed, patient examined, no change in status, stable for surgery.  I have reviewed the patient's chart and labs.  Questions were answered to the patient's satisfaction.     Ardis Hughs

## 2022-01-02 NOTE — Discharge Instructions (Signed)
  Post Anesthesia Home Care Instructions  Activity: Get plenty of rest for the remainder of the day. A responsible adult should stay with you for 24 hours following the procedure.  For the next 24 hours, DO NOT: -Drive a car -Paediatric nurse -Drink alcoholic beverages -Take any medication unless instructed by your physician -Make any legal decisions or sign important papers.  Meals: Start with liquid foods such as gelatin or soup. Progress to regular foods as tolerated. Avoid greasy, spicy, heavy foods. If nausea and/or vomiting occur, drink only clear liquids until the nausea and/or vomiting subsides. Call your physician if vomiting continues.  Special Instructions/Symptoms: Your throat may feel dry or sore from the anesthesia or the breathing tube placed in your throat during surgery. If this causes discomfort, gargle with warm salt water. The discomfort should disappear within 24 hours.  If you had a scopolamine patch placed behind your ear for the management of post- operative nausea and/or vomiting:  1. The medication in the patch is effective for 72 hours, after which it should be removed.  Wrap patch in a tissue and discard in the trash. Wash hands thoroughly with soap and water. 2. You may remove the patch earlier than 72 hours if you experience unpleasant side effects which may include dry mouth, dizziness or visual disturbances. 3. Avoid touching the patch. Wash your hands with soap and water after contact with the patch.  MAY HAVE TYLENOL AGAIN TODAY AFTER 2:45PM

## 2022-01-02 NOTE — Op Note (Signed)
Preoperative diagnosis:  Recurrent low-grade bladder cancer  Postoperative diagnosis:  Same  Procedure: Cystoscopy, bladder biopsy with fulguration, tumor measured 1.5 cm in aggregate  Surgeon: Ardis Hughs, MD  Anesthesia: General  Complications: None  Intraoperative findings:  #1: The patient had 10 independent small bladder lesions measuring between 1 and 3 mm.  These were located at the posterior bladder wall, trigone, and bladder dome. #2: At the patient's bladder neck there was necrotic tissue and a 9 healing ulcer that was biopsied and fulgurated.  EBL: Minimal  Specimens:  1: Posterior bladder wall 2.:  Bladder trigone 3.:  Bladder neck 4.:  Bladder dome  Indication: [name]. is a 66 y.o. patient with history of locally bladder cancer with numerous recurrences.  As part of his surveillance he was noted to have several areas of recurrence on his most recent cystoscopy.  After reviewing the management options for treatment, he elected to proceed with the above surgical procedure(s). We have discussed the potential benefits and risks of the procedure, side effects of the proposed treatment, the likelihood of the patient achieving the goals of the procedure, and any potential problems that might occur during the procedure or recuperation. Informed consent has been obtained.  Description of procedure:  The patient was taken to the operating room and general anesthesia was induced.  The patient was placed in the dorsal lithotomy position, prepped and draped in the usual sterile fashion, and preoperative antibiotics were administered. A preoperative time-out was performed.   21 French 30 degree cystoscope was gently passed to the patient's urethra and into the bladder under for the kind.  Cystoscopy was performed with the above findings.  I then exchanged the 30 degree lens for 70 degree lens and repeated the cystoscopic evaluation noting no additional areas of  concern.  I then read exchanged to the 30 degree lens and the bladder biopsy forceps and cold cup biopsied all of the areas.  I then copiously fulgurated the biopsied areas.  I then reevaluated and found 2 additional areas at the bladder dome which I subsequently biopsied and fulgurated.  I reinspected the entire bladder numerous times to ensure that there were no additional areas of concern prior to removing the scope.  I then passed an 15 French Foley catheter into the patient's bladder and instilled 10 cc of sterile water.  This was left to gravity drainage.  In the PACU, ~35m's with 2gm of Gemcitabine was instilled into the patient's bladder through an 122French Foley catheter.  This was allowed to dwell for 60-90 minutes prior to emptying the Foley catheter and removing it.   BArdis Hughs M.D.

## 2022-01-02 NOTE — Progress Notes (Signed)
Next dose of tramdol recorded on discharge instructions and instructed patient and friend Mardene Celeste. Verbalized understanding.

## 2022-01-02 NOTE — Anesthesia Procedure Notes (Signed)
Procedure Name: LMA Insertion Date/Time: 01/02/2022 10:13 AM Performed by: Clearnce Sorrel, CRNA Pre-anesthesia Checklist: Patient identified, Emergency Drugs available, Suction available and Patient being monitored Patient Re-evaluated:Patient Re-evaluated prior to induction Oxygen Delivery Method: Circle System Utilized Preoxygenation: Pre-oxygenation with 100% oxygen Induction Type: IV induction Ventilation: Mask ventilation without difficulty LMA: LMA inserted LMA Size: 4.0 Number of attempts: 1 Airway Equipment and Method: Bite block Placement Confirmation: positive ETCO2 Tube secured with: Tape Dental Injury: Teeth and Oropharynx as per pre-operative assessment

## 2022-01-02 NOTE — Anesthesia Postprocedure Evaluation (Signed)
Anesthesia Post Note  Patient: [name].  Procedure(s) Performed: CYSTOSCOPY WITH BLADDER BIOPSY FULGURATION  AND GEMCITABINE     Patient location during evaluation: PACU Anesthesia Type: General Level of consciousness: awake and alert Pain management: pain level controlled Vital Signs Assessment: post-procedure vital signs reviewed and stable Respiratory status: spontaneous breathing, nonlabored ventilation, respiratory function stable and patient connected to nasal cannula oxygen Cardiovascular status: blood pressure returned to baseline and stable Postop Assessment: no apparent nausea or vomiting Anesthetic complications: no   No notable events documented.  Last Vitals:  Vitals:   01/02/22 1145 01/02/22 1200  BP: 119/81 118/85  Pulse: 70 70  Resp: 11 11  Temp:    SpO2: 99% 94%    Last Pain:  Vitals:   01/02/22 1145  TempSrc:   PainSc: 7                  [name]

## 2022-01-03 ENCOUNTER — Encounter (HOSPITAL_BASED_OUTPATIENT_CLINIC_OR_DEPARTMENT_OTHER): Payer: Self-pay | Admitting: Urology

## 2022-01-03 LAB — SURGICAL PATHOLOGY

## 2022-08-15 ENCOUNTER — Other Ambulatory Visit: Payer: Self-pay | Admitting: Urology

## 2022-09-04 ENCOUNTER — Ambulatory Visit: Admit: 2022-09-04 | Payer: PPO | Admitting: Urology

## 2022-09-04 SURGERY — TRANSURETHRAL RESECTION OF BLADDER TUMOR WITH MITOMYCIN-C
Anesthesia: General

## 2023-06-30 ENCOUNTER — Encounter: Payer: Self-pay | Admitting: Gastroenterology
